# Patient Record
Sex: Female | Born: 1950
Health system: Southern US, Community
[De-identification: ages and names within clinical notes are randomized; demographics above are authoritative.]

## PROBLEM LIST (undated history)

## (undated) DIAGNOSIS — I4719 Other supraventricular tachycardia: Secondary | ICD-10-CM

## (undated) DIAGNOSIS — D4709 Other mast cell neoplasms of uncertain behavior: Secondary | ICD-10-CM

## (undated) DIAGNOSIS — J329 Chronic sinusitis, unspecified: Secondary | ICD-10-CM

## (undated) DIAGNOSIS — J45909 Unspecified asthma, uncomplicated: Secondary | ICD-10-CM

## (undated) DIAGNOSIS — L509 Urticaria, unspecified: Secondary | ICD-10-CM

## (undated) DIAGNOSIS — Z9889 Other specified postprocedural states: Secondary | ICD-10-CM

## (undated) DIAGNOSIS — F419 Anxiety disorder, unspecified: Secondary | ICD-10-CM

## (undated) DIAGNOSIS — T7840XA Allergy, unspecified, initial encounter: Secondary | ICD-10-CM

## (undated) DIAGNOSIS — G43909 Migraine, unspecified, not intractable, without status migrainosus: Secondary | ICD-10-CM

## (undated) DIAGNOSIS — R0600 Dyspnea, unspecified: Secondary | ICD-10-CM

## (undated) DIAGNOSIS — G4730 Sleep apnea, unspecified: Secondary | ICD-10-CM

## (undated) DIAGNOSIS — Z87891 Personal history of nicotine dependence: Secondary | ICD-10-CM

## (undated) DIAGNOSIS — K219 Gastro-esophageal reflux disease without esophagitis: Secondary | ICD-10-CM

## (undated) DIAGNOSIS — I442 Atrioventricular block, complete: Secondary | ICD-10-CM

## (undated) DIAGNOSIS — R112 Nausea with vomiting, unspecified: Secondary | ICD-10-CM

## (undated) DIAGNOSIS — I471 Supraventricular tachycardia: Secondary | ICD-10-CM

## (undated) DIAGNOSIS — E785 Hyperlipidemia, unspecified: Secondary | ICD-10-CM

## (undated) DIAGNOSIS — Z950 Presence of cardiac pacemaker: Secondary | ICD-10-CM

## (undated) DIAGNOSIS — K589 Irritable bowel syndrome without diarrhea: Secondary | ICD-10-CM

## (undated) HISTORY — PX: TONSILLECTOMY AND ADENOIDECTOMY: SUR1326

## (undated) HISTORY — DX: Hyperlipidemia, unspecified: E78.5

## (undated) HISTORY — DX: Gastro-esophageal reflux disease without esophagitis: K21.9

## (undated) HISTORY — PX: COLONOSCOPY: SHX174

## (undated) HISTORY — DX: Allergy, unspecified, initial encounter: T78.40XA

## (undated) HISTORY — PX: APPENDECTOMY: SHX54

## (undated) HISTORY — PX: TUBAL LIGATION: SHX77

## (undated) HISTORY — DX: Urticaria, unspecified: L50.9

## (undated) HISTORY — PX: AUGMENTATION MAMMAPLASTY: SUR837

## (undated) HISTORY — DX: Chronic sinusitis, unspecified: J32.9

## (undated) HISTORY — DX: Irritable bowel syndrome without diarrhea: K58.9

## (undated) HISTORY — PX: INSERT / REPLACE / REMOVE PACEMAKER: SUR710

## (undated) HISTORY — PX: CHOLECYSTECTOMY: SHX55

## (undated) HISTORY — DX: Migraine, unspecified, not intractable, without status migrainosus: G43.909

---

## 1983-06-04 HISTORY — PX: BREAST ENHANCEMENT SURGERY: SHX7

## 2002-08-01 ENCOUNTER — Observation Stay (HOSPITAL_COMMUNITY): Admission: AD | Admit: 2002-08-01 | Discharge: 2002-08-03 | Payer: Self-pay | Admitting: Family Medicine

## 2003-01-06 ENCOUNTER — Emergency Department (HOSPITAL_COMMUNITY): Admission: EM | Admit: 2003-01-06 | Discharge: 2003-01-06 | Payer: Self-pay | Admitting: *Deleted

## 2004-08-03 ENCOUNTER — Ambulatory Visit (HOSPITAL_COMMUNITY): Admission: RE | Admit: 2004-08-03 | Discharge: 2004-08-03 | Payer: Self-pay | Admitting: Family Medicine

## 2004-08-14 ENCOUNTER — Other Ambulatory Visit: Admission: RE | Admit: 2004-08-14 | Discharge: 2004-08-14 | Payer: Self-pay | Admitting: Dermatology

## 2005-01-09 ENCOUNTER — Ambulatory Visit (HOSPITAL_COMMUNITY): Admission: RE | Admit: 2005-01-09 | Discharge: 2005-01-09 | Payer: Self-pay | Admitting: Family Medicine

## 2005-05-08 ENCOUNTER — Ambulatory Visit (HOSPITAL_COMMUNITY): Admission: RE | Admit: 2005-05-08 | Discharge: 2005-05-08 | Payer: Self-pay | Admitting: Family Medicine

## 2006-06-30 ENCOUNTER — Ambulatory Visit (HOSPITAL_COMMUNITY): Admission: RE | Admit: 2006-06-30 | Discharge: 2006-06-30 | Payer: Self-pay | Admitting: Family Medicine

## 2006-11-21 ENCOUNTER — Ambulatory Visit (HOSPITAL_COMMUNITY): Admission: RE | Admit: 2006-11-21 | Discharge: 2006-11-21 | Payer: Self-pay | Admitting: Family Medicine

## 2007-03-05 ENCOUNTER — Ambulatory Visit (HOSPITAL_COMMUNITY): Admission: RE | Admit: 2007-03-05 | Discharge: 2007-03-05 | Payer: Self-pay | Admitting: Family Medicine

## 2007-03-18 ENCOUNTER — Other Ambulatory Visit: Admission: RE | Admit: 2007-03-18 | Discharge: 2007-03-18 | Payer: Self-pay | Admitting: Obstetrics & Gynecology

## 2007-07-08 ENCOUNTER — Ambulatory Visit (HOSPITAL_COMMUNITY): Admission: RE | Admit: 2007-07-08 | Discharge: 2007-07-08 | Payer: Self-pay | Admitting: Family Medicine

## 2007-12-14 ENCOUNTER — Ambulatory Visit (HOSPITAL_COMMUNITY): Admission: RE | Admit: 2007-12-14 | Discharge: 2007-12-14 | Payer: Self-pay | Admitting: Family Medicine

## 2008-01-22 ENCOUNTER — Ambulatory Visit: Payer: Self-pay | Admitting: Gastroenterology

## 2008-01-25 ENCOUNTER — Ambulatory Visit: Payer: Self-pay | Admitting: Gastroenterology

## 2008-02-10 ENCOUNTER — Encounter: Payer: Self-pay | Admitting: Gastroenterology

## 2008-02-10 ENCOUNTER — Ambulatory Visit: Payer: Self-pay | Admitting: Gastroenterology

## 2008-02-10 ENCOUNTER — Ambulatory Visit (HOSPITAL_COMMUNITY): Admission: RE | Admit: 2008-02-10 | Discharge: 2008-02-10 | Payer: Self-pay | Admitting: Gastroenterology

## 2008-03-08 ENCOUNTER — Ambulatory Visit (HOSPITAL_COMMUNITY): Admission: RE | Admit: 2008-03-08 | Discharge: 2008-03-08 | Payer: Self-pay | Admitting: Gastroenterology

## 2008-03-10 ENCOUNTER — Ambulatory Visit: Payer: Self-pay | Admitting: Gastroenterology

## 2008-05-12 ENCOUNTER — Ambulatory Visit (HOSPITAL_COMMUNITY): Admission: RE | Admit: 2008-05-12 | Discharge: 2008-05-12 | Payer: Self-pay | Admitting: Family Medicine

## 2008-05-30 ENCOUNTER — Other Ambulatory Visit: Admission: RE | Admit: 2008-05-30 | Discharge: 2008-05-30 | Payer: Self-pay | Admitting: Obstetrics & Gynecology

## 2008-07-19 ENCOUNTER — Ambulatory Visit (HOSPITAL_COMMUNITY): Admission: RE | Admit: 2008-07-19 | Discharge: 2008-07-19 | Payer: Self-pay | Admitting: Obstetrics & Gynecology

## 2008-07-19 ENCOUNTER — Encounter: Payer: Self-pay | Admitting: Obstetrics & Gynecology

## 2009-11-20 ENCOUNTER — Ambulatory Visit (HOSPITAL_COMMUNITY): Admission: RE | Admit: 2009-11-20 | Discharge: 2009-11-20 | Payer: Self-pay | Admitting: Family Medicine

## 2010-06-24 ENCOUNTER — Encounter: Payer: Self-pay | Admitting: Family Medicine

## 2010-09-18 LAB — CBC
MCV: 98.6 fL (ref 78.0–100.0)
RBC: 4.21 MIL/uL (ref 3.87–5.11)
RDW: 13.2 % (ref 11.5–15.5)

## 2010-09-18 LAB — URINALYSIS, ROUTINE W REFLEX MICROSCOPIC
Bilirubin Urine: NEGATIVE
Glucose, UA: NEGATIVE mg/dL
Protein, ur: NEGATIVE mg/dL
Specific Gravity, Urine: 1.015 (ref 1.005–1.030)
pH: 6 (ref 5.0–8.0)

## 2010-09-18 LAB — BASIC METABOLIC PANEL
BUN: 20 mg/dL (ref 6–23)
Calcium: 9.1 mg/dL (ref 8.4–10.5)
GFR calc non Af Amer: >60 mL/min (ref >60)
Glucose, Bld: 98 mg/dL (ref 70–99)
Sodium: 141 mEq/L (ref 135–145)

## 2010-10-16 NOTE — Letter (Signed)
April 19, 2008    [employer] [place] [employer]:  [employer].   Regarding:  [name], birthday [birth year]  [address] [place], [place] [address].   Dear Madam/Sir:   I am writing a letter to explain the necessity for [name]'s  capsule endoscopy.  [name] is a 60 year old female who has had  endometrial ablation.  She presented to my office in August 2009 with  iron deficiency anemia.  A workup for her source of iron deficiency  anemia was initiated.  She has no other explanation for iron deficiency  anemia except for an obscure GI bleed.  In July 2009, she had a  hemoglobin of 11.7 with an MCV of 91.3 and a creatinine of 0.99.  Her  ferritin was measured at 6 (10-291).  She had a colonoscopy followed by  an esophagogastroduodenoscopy in September 2009.  The colonoscopy  revealed colon polyps and internal hemorrhoids.  None of the polyps  could be identified as a source of her iron deficiency anemia.  An  esophagogastroduodenoscopy revealed a normal esophagus, stomach, and  small intestine.  Biopsies were obtained via cold forceps to evaluate  for celiac sprue as an etiology for her iron deficiency anemia.  Her  duodenal biopsy showed benign small bowel mucosa.  No source for her  iron deficiency/obscure GI bleed was identified on endoscopy and thus a  capsule endoscopy was performed to complete evaluation of her GI tract  and to evaluate the likelihood of a small bowel arteriovenous  malformation or small bowel tumor or small bowel ulcer as a source for  her obscure GI bleed.   Please feel free to contact me at [phone] if you have any  additional questions or if I may be able to clarify the reason for  performing this exam.  POLICY NUMBER:  T[employer] - [phone].   GROUP NUMBER:  [id number].      Kassie Mends, M.D.  Electronically Signed     SM/MEDQ  D:  04/19/2008  T:  04/20/2008  Job:  [id number]   cc:   Lorin Picket A. Gerda Diss, MD

## 2010-10-16 NOTE — Consult Note (Signed)
NAMENEVEYAH, GARZON             ACCOUNT NO.:  [id number]   MEDICAL RECORD NO.:  [id number]          PATIENT TYPE:  AMB   LOCATION:  DAY                           FACILITY:  [facility]   PHYSICIAN:  Kassie Mends, M.D.      DATE OF BIRTH:  [birth year]   DATE OF CONSULTATION:  DATE OF DISCHARGE:                                 CONSULTATION   REQUESTING PHYSICIAN:  Scott A. Gerda Diss, MD   REASON FOR CONSULTATION:  Iron deficiency anemia.   HISTORY OF PRESENT ILLNESS:  Ms. Bodi is a 60 year old Caucasian  female.  She was recently diagnosed with iron deficiency anemia.  She  was started on iron.  She did have surgery last week.  She tells me that  through a CBC ordered by Dr. Sherald Hess.  She had a normal CBC at the  time of surgery.  She denies any previous history of iron deficiency  anemia other than possibly around childbirth when she did require a  transfusion.  She denies any GI complaints.  She had been having soft  brown daily bowel movements.  Denies any rectal bleeding or melena.  She  does have a history of heartburn and indigestion, which has been well  controlled on Nexium.  She has been on Nexium 40 mg b.i.d.  She denies  any dysphagia or odynophagia.  She denies any NSAID use.  She has been  using fiber and MiraLax occasionally for constipation.  This seems to  work very well for her.  She did have a problem with thrombosed  hemorrhoids, which required excision back earlier this year.   PAST MEDICAL HISTORY:  1. Migraine headaches.  2. Chronic GERD.  She tells me she has a remote history of peptic      ulcer disease when she was in her 73s.  3. She has a hiatal hernia.  4. Hypercholesterolemia.  5. She has a history of thrombosed hemorrhoids.  6. History of menopause.   PAST SURGICAL HISTORY:  1. She is status post cholecystectomy.  2. Endometrial hyperplasia.  3. Status post appendectomy.  4. Uterine suspension.  5. Tubal ligation.  6. Tonsillectomy.  7. Breast  augmentation twice, fibrocystic breast.   CURRENT MEDICATIONS:  1. Topamax 100 mg b.i.d.  2. Nexium 40 mg b.i.d.  3. Wellbutrin 300 mg daily.  4. Allegra 100 mg daily.  5. Fish oil 2400 mg daily.  6. Multivitamin daily.  7. Biotin once daily.  8. B-complex once daily.  9. Vitamin E once daily.  10.MiraLax 17 g at bedtime.   ALLERGIES:  BETA-BLOCKERS cause shortness of breath.   FAMILY HISTORY:  There is no known family history of  colorectal  carcinoma, liver or chronic GI problems.  Mother deceased at 11 with  history of dementia.  Father deceased at 68 with history of lung cancer.  She has 5 healthy siblings and one died in a motor vehicle accident.   SOCIAL HISTORY:  Ms. Maske is married.  She has 2 grown healthy  children.  She is Production designer, theatre/television/film at [employer].  She  has a 20  plus-pack-year history of tobacco use.  She consumes a couple  of glasses of wine per year.  Denies any drug use.   REVIEW OF SYSTEMS:  See HPI.  She does have easy bruising.  Denies any  chest pain or palpitations.  Denies any shortness of breath.   PHYSICAL EXAMINATION:  VITAL SIGNS:  Weight 131 pounds, height 64-1/2  inches, temp 98.2, blood pressure 100/68, and pulse 60.  GENERAL:  She is a well-developed and well-nourished Caucasian female in  no acute Distress.HEENT:  Sclerae clear and nonicteric.  Conjunctivae  are pink.  Oropharynx pink and moist without lesions.NECK:  Supple  without mass or thyromegaly.CHEST:  Heart regular rhythm.  Normal S1 and  S2 without murmurs, clicks, rubs, or gallops.  LUNGS:  Clear to auscultation bilaterally.ABDOMEN:  Positive bowel  sounds x4.  No bruits auscultated.  Soft, nontender, nondistended  without palpable mass or hepatosplenomegaly.  No rebound tenderness or  guarding.EXTREMITIES:  Without clubbing or edema.   LABORATORY STUDIES:  From December 26, 2007, she had a ferritin of 6.  She  had a hemoglobin of 11.6, hematocrit of 38.1, and  MCV of 91.3.  She had  a normal MET-7.  She had a normal platelet and white blood cell count.   IMPRESSION:  Ms. Sankey is a 60 year old Caucasian female with  history of a mild iron deficiency anemia.  She denies any  gastrointestinal problems other than chronic constipation and chronic  gastroesophageal reflux disease, well controlled on proton pump  inhibitor.  She does have remote history of peptic ulcer disease.  She  has never had a colonoscopy.  She is going to require further  evaluation.  I am unsure of her Hemoccult status.  She is going to need  colonoscopy to rule out colorectal carcinoma.  If colonoscopy is benign  and she is Hemoccult positive, we would pursue  esophagogastroduodenoscopy at the same time to determine whether she has  peptic ulcer disease.   PLAN:  1. Hemoccult cards x3 to be return as soon as possible.  2. Colonoscopy plus/minus EGD with Dr. Cira Servant in the near future.      Discussed procedure including risks      including but not limited to bleeding, infection, perforation, and      drug reaction.  She agrees with the plan and consent will be      obtained.  3. Continue Nexium 40 mg b.i.d.      Lorenza Burton, N.P.      Kassie Mends, M.D.  Electronically Signed    KJ/MEDQ  D:  01/22/2008  T:  01/23/2008  Job:  [id number]   cc:   Lorin Picket A. Gerda Diss, MD  Fax: [phone]

## 2010-10-16 NOTE — Op Note (Signed)
[name], [name]             ACCOUNT NO.:  [id number]   MEDICAL RECORD NO.:  [id number]          PATIENT TYPE:  AMB   LOCATION:  SDC                           FACILITY:  [facility]   PHYSICIAN:  M. Leda Quail, MD  DATE OF BIRTH:  1950-10-09   DATE OF PROCEDURE:  07/19/2008  DATE OF DISCHARGE:                               OPERATIVE REPORT   PREOPERATIVE DIAGNOSES:  27. A 60 year old G2, P2 married white female with 6 cm left adnexal      mass.  2. Left lower quadrant pain.  3. History of uterine suspension.  4. History of bilateral tubal ligation.  5. Migraines.  6. Gastroesophageal reflux disease.  7. History of appendectomy at age 54.   POSTOPERATIVE DIAGNOSES:  75. A 60 year old G2, P2 married white female with 6 cm left adnexal      mass.  2. Left lower quadrant pain.  3. History of uterine suspension  4. History of bilateral tubal ligation.  5. Migraines.  6. Gastroesophageal reflux disease.  7. History of appendectomy at age 46.   PROCEDURE:  Laparoscopic left salpingo-oophorectomy.   SURGEON:  M. Leda Quail, MD   ASSISTANT:  Edwena Felty. Romine, MD   ANESTHESIA:  General endotracheal, Dr. Jean Rosenthal oversaw the case, Jola Babinski  was her CRNA.   FINDINGS:  Enlarged left ovary filled with clear fluid.   SPECIMENS:  Left tube and ovary sent to Pathology.  Fluid aspirated from  the cyst was not sent as it appeared totally benign and clear.   ESTIMATED BLOOD LOSS:  Minimal.   FLUIDS:  3000 mL of LR.   URINE OUTPUT:  300 mL.   COMPLICATIONS:  None.   INDICATIONS:  [name] is a 60 year old white female who has a  history of left lower quadrant pain.  She underwent ultrasound several  months ago, which showed a 6-cm ovarian mass.  This was followed with a  repeat ultrasound at an appropriate interval of time.  The ovary had not  changed at all or had increased slightly in size.  She began to have  some left lower quadrant pain and because of that was referred  to me.  After discussing findings and procedure, she decided to go ahead and  proceed with scheduling surgery.   OPERATIVE NOTE:  The patient was taken to the operating room.  She was  placed in supine position.  General endotracheal anesthesia was  administered by Anesthesia without difficulty.  Running IV was present  in her right arm.  Informed consent was present on chart.  Legs were  positioned in the low lithotomy position in Wilson stirrups.  Abdomen,  perineum, inner thighs, and vagina prepped in normal sterile fashion  with Betadine prep.  Attention was turned towards the vagina, the legs  were lifted slightly.  A bivalve speculum was placed in the vagina.  The  anterior lip of the cervix was grasped with single-tooth tenaculum.  An  acorn uterine manipulator was advanced to the cervical os and attached  to the tenaculum as a means of manipulating the uterus throughout the  procedure.  The bivalve speculum was removed.  Foley catheter was  inserted in the bladder under sterile condition.  Legs were put back in  the low lithotomy position.  The patient was draped in normal sterile  fashion.   Attention was turned to the abdomen.  Sterile gloves and gowns were  changed.  Above the umbilicus where her laparoscopic cholecystectomy  incision was performed, the previous incision was identified.  Marcaine  0.25% was instilled in the umbilical incision.  A 10-mm skin incision  was made with a #11 blade.  The tissue was dissected with a hemostat  down to the fascia.  The fascia was grasped with a Kocher clamp.  It was  elevated.  Using curved Mayo scissors, fascia was incised sharply.  Peritoneum was identified and incised sharply with Metzenbaum scissors.  A curved S retractor was placed through this incision.  The operator's  index finger was used to feel around, and this tissue felt very smooth  without any adhesions.  A 2-0 Vicryl on an UR needle was obtained.  Purse-string suture  was placed around the fascia at the umbilicus.  Then, a 10-mm Hasson was placed through this incision and attached to  the skin using the suture.  Diagnostic laparoscope was obtained.  This  was placed through the incision to confirm intraperitoneal placement.  Then CO2 gas was placed under low flow to begin the pneumoperitoneum.  This gas went into the abdomen very easily under low flow and it was  then placed on high flow.  The patient was placed in Trendelenburg  positioning at this point.  Uterus was elevated.  The left and right  ovaries were identified.  The ureters were identified bilaterally.  The  right ovary appeared normal.  She has had a previous tubal ligation,  which was noted.  The left ovary was enlarged but appeared benign as  they were no adhesions and it was freely mobile.  The upper abdomen was  surveyed and appeared normal.  She had a small amount of adhesions where  appendectomy was done as well where cholecystectomy was done, these were  very minor.  The left upper quadrant was visualized.  No abnormalities  were noted.  The anterior and posterior culs-de-sac also appeared fine.  The patient had an uterine suspension, which appeared to be done using a  round ligament suspension by attaching the top of the uterus to the  round ligaments.  There was thick scar on both sides connected to the  round ligament and the fundus of the uterus.  At this point, the  abdominal wall was transilluminated and site locations for right and  left lower quadrant ports were identified.  Marcaine 0.25% was used to  anesthetize the skin.  A #11 blade was used to make the skin incision  and then a 5-mm blunt trocar and ports were placed under direct  visualization in the right and left lower quadrants.  The trocars were  removed.  Blunt probe and an endoscopic grasper was obtained.  The left  ovary was freely mobile, could be lifted up very easily.  At this point,  the EnSeal device was  obtained.  The IP ligament was serially clamped,  cauterized, and incised using a sound indicator on the EnSeal device to  ensure proper cauterization before incising the pedicle.  This was done  serially until left IP ligament was freely transected.  Then, the utero-  ovarian pedicle was serially clamped, cauterized, and incised on the  left side until it was fully dissected.  Then, the remaining broad  ligament filmy tissue was clamped, cauterized, and incised until the  ovary was completely free.  The ovary was then drained of fluid using  endoscopic needle through the right port.  About 80 mL of totally clear  fluid was obtained through this.  At this point, the ovary was well  decompressed and small enough to bring out through the midline incision.  The laparoscope was changed to an operative scope, and a toothed grasper  was used to grasp into the ovary.  This was brought out through the  incision.  It could not be completely brought out through the port, so  the port and ovary were completely removed.  Once the ovary and tube  were out, the midline port was replaced back through the fascia without  difficulty.  Laparoscope was used to confirm this placement was correct,  and it was.  The pelvis was resurveyed, no bleeding was noted from the  left side.  The right and left lower quadrant ports were removed under  direct visualization with the laparoscope.  No bleeding was noted.  The  IP ligament was re-visualized one more time, and no bleeding was noted.  At this point, all instruments were removed from the vagina except for  the Hasson in the midline.  The patient was flattened, and she was given  3 deep breaths by the CRNA to help get all the air out of the abdomen.  The abdomen was compressed by the operative assistant.  Once this was  done without difficulty, the midline port was removed.  The operator's  index finger was placed in this incision to ensure no bowel was present,   which it was not.  The midline incision at the fascial layer was closed  by pulling the purse-string suture together and tying it tightly.  The  skin was then closed with a subcuticular stitch of 3-0 Vicryl, and  Dermabond was applied across the 3 incisions to keep them watertight.   Attention was turned to the vagina.  The acorn uterine manipulator and  tenaculum were removed from the cervix.  No bleeding was noted.  The  patient's urine output had been low during the procedure.  We felt that  she was probably dehydrated and so the Foley catheter was left in place.  Betadine prep was cleansed off her skin.  Her legs were positioned back  in supine position.  She did have SCDs present in the lower extremities  bilaterally through the case.  The patient was awakened from anesthesia,  extubated, and taken to the recovery room in stable condition.      Lum Keas, MD  Electronically Signed     MSM/MEDQ  D:  07/19/2008  T:  07/19/2008  Job:  [id number]   cc:   Lorin Picket A. Gerda Diss, MD  Fax: [phone]

## 2010-10-16 NOTE — Op Note (Signed)
NAMEJOSLIN, DOELL             ACCOUNT NO.:  [id number]   MEDICAL RECORD NO.:  [id number]          PATIENT TYPE:  AMB   LOCATION:  DAY                           FACILITY:  [facility]   PHYSICIAN:  Kassie Mends, M.D.      DATE OF BIRTH:  01-27-1951   DATE OF PROCEDURE:  DATE OF DISCHARGE:                                PROCEDURE NOTE   REFERRING PHYSICIAN:  Scott A. Luking, MD   PROCEDURE:  Colonoscopy with cold snare, cold forceps, and snare cautery  polypectomy.   INDICATION FOR EXAM:  Ms. Steffy is a 60 year old female who presents  with iron deficiency anemia.  Her ferritin was 6.  Her last menstrual  cycle was a year ago.  She reports being on a diet and having  intentional weight loss.  She denies any bright red blood per rectum or  black stools like tar.   FINDINGS:  1. Profound melanosis coli as well as a tortuous colon which required      multiple changes in position and pressure to successfully intubate      the cecum.  She had a large amount of liquid stool in the colon      which had particulate matter in it and made it challenging to      aspirate.  Polyps less than 5 mm may have been easily missed.  2. Two 6-mm sessile left colon polyps removed via snare cautery.  Two      4-mm sessile sigmoid colon in descending colon polyps removed via      cold forceps.  One 3-mm sessile rectal polyp removed via cold      forceps.  Otherwise, no masses, inflammatory changes, diverticula,      or AVMs seen.  3. Small internal hemorrhoids.  Otherwise, normal retroflex view of      the rectum.  4. Normal esophagus without evidence of Barrett, mass, erosion,      ulceration, or stricture.  5. Normal stomach, second portion of the duodenal bulb, and second      portion of the duodenum.  Biopsies obtained via cold forceps to      evaluate for celiac sprue as an etiology for iron deficiency      anemia.  Moderate bile staining seen in the second portion of the      duodenum.   DIAGNOSES:  1. Multiple colon polyps.  2. Profound melanosis coli.  3. Normal upper endoscopy.   RECOMMENDATIONS:  1. Restart iron and continue for 3 months.  2. Will call Ms. Matherly with the results of her biopsies.  If her      biopsies do not show an etiology for her iron deficiency anemia,      then she will need capsule endoscopy.  3. Return visit in 2 months with Dr. Cira Servant.  4. No aspirin, NSAIDs, or anticoagulation for 7 days.  She should      follow a high-fiber diet.  She was given a handout on high-fiber      diet, polyps, and hemorrhoids.   MEDICATIONS:  1. Demerol 50 mg IV.  2. Versed 3 mg IV.  3. Phenergan 12.5 mg IV.   PROCEDURE TECHNIQUE:  Physical exam was performed.  Informed consent was  obtained from the patient explaining the benefits, risks, and  alternatives to the procedure.  The patient was connected to the monitor  and placed in the left lateral position.  Continuous oxygen was provided  by nasal cannula, and IV medicine administered through an indwelling  cannula.  After administration of sedation and rectal exam, the  patient's rectum was intubated and the scope was advanced under direct  visualization to the cecum.  The scope was removed slowly by carefully  examining the color, texture, anatomy, and integrity of the mucosa on  the way out.   After the colonoscopy, the patient's esophagus was intubated with the  diagnostic gastroscope, and the scope was advanced under direct  visualization to the second portion of the duodenum.  The scope was  removed slowly by carefully examining the color, texture, anatomy, and  integrity of the mucosa on the way out.  The patient was recovered in  endoscopy and discharged home in satisfactory condition.   PATH:  Simple adenomas. hyperplastic polyps. No celiac sprue.      Kassie Mends, M.D.  Electronically Signed     SM/MEDQ  D:  02/10/2008  T:  02/11/2008  Job:  [id number]   cc:   Lorin Picket A. Gerda Diss, MD   Fax: [phone]

## 2010-10-19 NOTE — Op Note (Signed)
[name], [name]             ACCOUNT NO.:  [id number]   MEDICAL RECORD NO.:  [id number]          PATIENT TYPE:  AMB   LOCATION:  DAY                           FACILITY:  [facility]   PHYSICIAN:  Kassie Mends, M.D.      DATE OF BIRTH:  1950-07-09   DATE OF PROCEDURE:  03/10/2008  DATE OF DISCHARGE:                               OPERATIVE REPORT   PROCEDURE:  [facility] capsule endoscopy.   INDICATION FOR EXAM:  [name] is a 60 year old female who  presented with an obscure GI bleed.  Her ferritin was measured at 6.  She had a colonoscopy and an upper endoscopy which showed no obvious  source for GI bleed.   PROCEDURE DATA:  Height 64 inches, weight 124 pounds, waist 31 inches,  build, thin, gastric passage time 25 minutes, small bowel passage time 4  hours and 24 minutes.   FINDINGS:  Limited views of the gastric mucosa due to the retained  contents.  No fresh blood or old blood seen in the stomach.  Fair  visualization of the small bowel mucosa.  No AVMs, ulcers, or masses  seen.  Limited visualization of the mucosa of the colon.  At 6 hours and  46 minutes, it appears that she has a pinworm.   DIAGNOSES:  No source for iron-deficiency anemia identified, except  multiple colon polyps that were removed.  There were adenomatous and  hyperplastic polyps.  Biopsies of her small intestines showed no  evidence for celiac sprue. Pinworm identified on capsule study. Her do  has been tretaed recently.   RECOMMENDATIONS:  1. [name] should continue her iron.  She will see me in 2      months and we can reevaluate her ferritin.  If her iron-deficiency      anemia persists, then she should consider IV iron replacement.  2. I also called in a prescription for mebendazole 100 mg tablets for      her to take 1 now and 1 in 2 weeks.  I did inform her that all the      people living in the house should be screened for pinworms.  Her      prescription was called into [facility].      Kassie Mends, M.D.  Electronically Signed     SM/MEDQ  D:  03/17/2008  T:  03/18/2008  Job:  [id number]   cc:   Lorin Picket A. Gerda Diss, MD  Fax: [phone]

## 2010-10-19 NOTE — H&P (Signed)
NAMEBANESSA, MAO                         ACCOUNT NO.:  [id number]   MEDICAL RECORD NO.:  [id number]                  PATIENT TYPE:   LOCATION:  210                                  FACILITY:   PHYSICIAN:  Donna Bernard, M.D.             DATE OF BIRTH:   DATE OF ADMISSION:  08/01/2002  DATE OF DISCHARGE:                                HISTORY & PHYSICAL   CHIEF COMPLAINT:  Chest pain.   HISTORY OF PRESENT ILLNESS:  This patient is a 60 year old Caucasian female  with a fairly benign prior medical history.  She presented to the ER the  morning of admission with complaints of chest pain.  The patient states she  awakened the other night with substernal deep pressure in her chest.  This  was accompanied by shortness of breath and sweating.  The patient had no  nausea.  She called EMS.  Soon after they arrived, her discomfort improved,  but she went on to [facility] Emergency Room.  There was evaluated and  cardiac enzymes were negative.  Initial EKG unremarkable but there were  significant risk factors and they recommended admission.  She requested  transfer here.  Currently, the patient is experiencing no significant  discomfort.  She does note some shortness of breath off and on in the last  several weeks intermittently.  She does smoke unfortunately about a pack per  day and has done so since age 67.  She does have reflux symptoms on  occasion, several times a week.  She states that this discomfort was  markedly different than that.  She has been under increased stress recently  with family issues and business issues.  The patient notes compliance with  her current medications which include:   CURRENT MEDICATIONS:  Wellbutrin XL 300 mg daily, Allegra p.r.n., Flonase  two sprays each nostril p.r.n.   PAST SURGICAL HISTORY:  Appendectomy, cholecystectomy.   SOCIAL HISTORY:  Patient is married and smokes a pack per day.  No alcohol.  Does have children.   FAMILY  HISTORY:  Positive for coronary artery disease in an uncle in his  85's and her mother had heart disease that she felt was related to her  diabetes.  Also, positive for hypertension.   REVIEW OF SYSTEMS:  Otherwise, negative.   PHYSICAL EXAMINATION:  VITAL SIGNS:  Blood pressure 118/70, pulse 85,  respiratory rate normal.  GENERAL:  Alert, currently in no apparent distress.  HEENT:  Normal.  Disks sharp.  Fundi normal.  NECK:  Supple, no JVD.  LUNGS:  Clear.  CHEST:  No chest wall tenderness.  HEART:  Normal heart rate and rhythm without significant murmurs.  ABDOMEN:  Some mild epigastric tenderness to deep pressure.  No rebound, no  guarding, no organomegaly.  NEUROLOGIC:  Intact.  SKIN:  Normal.   LABORATORY DATA:  CPK and troponin, MB all within normal limits.  EKG:  There are nonspecific ST-T  changes noted.   IMPRESSION:  Chest pain with somewhat bothersome features, with multiple  risk factors.  With normal enzymes and unremarkable EKG, I feel that 24 hour  monitoring is the most appropriate decision at this point.   PLAN:  Serial cardiac enzymes, telemetry and maintain other medications  further or as noted on the chart.   NOTE:  The patient was given nitroglycerin paste in the ER, but now is  noting headache and will hold off on this for now.  Further orders as noted  on the chart.                                                Donna Bernard, M.D.    Karie Chimera  D:  08/01/2002  T:  08/01/2002  Job:  [id number]

## 2010-10-19 NOTE — Procedures (Signed)
   NAME:  [name], [name]                       ACCOUNT NO.:  [id number]   MEDICAL RECORD NO.:  [id number]                   PATIENT TYPE:  INP   LOCATION:  A210                                 FACILITY:  [facility]   PHYSICIAN:  Donna Bernard, M.D.             DATE OF BIRTH:  1951/03/14   DATE OF PROCEDURE:  DATE OF DISCHARGE:  08/03/2002                                EKG INTERPRETATION   DESCRIPTION OF PROCEDURE:  Electrocardiogram reveals normal sinus rhythm  with borderline low voltage in the limb leads.  There are no significant ST  segment changes.                                               Donna Bernard, M.D.    Karie Chimera  D:  08/10/2002  T:  08/10/2002  Job:  [id number]

## 2010-10-19 NOTE — Consult Note (Signed)
NAME:  [name], [name]                       ACCOUNT NO.:  [id number]   MEDICAL RECORD NO.:  [id number]                   PATIENT TYPE:  INP   LOCATION:  A210                                 FACILITY:  [facility]   PHYSICIAN:  Vida Roller, M.D.                DATE OF BIRTH:  25-Dec-1950   DATE OF CONSULTATION:  DATE OF DISCHARGE:                                   CONSULTATION   HISTORY OF PRESENT ILLNESS:  [name] is a 60 year old white female  with no significant past medical history other than gastroesophageal reflux  disease who presented to [facility] emergency room complaining of  substernal chest discomfort. She states that she was asleep and had the  sudden onset of discomfort in her chest, associated with significant  diaphoresis. No radiation. Minimal shortness of breath and no palpitations.  The pain lasted approximately one hour. At that point, she called the EMS  System which arrived at her house and placed her on nasal cannula O2 with  complete resolution of the discomfort. She presented to the emergency room  at [facility], where she was evaluated, the records of which are sketchy but  it appears that she requested transportation here to [facility] for  definitive evaluation. There is some documentation that cardiac enzymes were  drawn and were negative. There is no documentation of a chest x-ray. Upon  interview this morning, she is pain free and feeling well. She has had no  recurrence of the discomfort. She was requesting definitive evaluation.   PAST MEDICAL HISTORY:  Significant for cholecystitis, status post  cholecystectomy. Gastroesophageal reflux disease.   CARDIAC RISK FACTORS:  Significant only for tobacco abuse.  She has no  significant family history. No hypertension, diabetes mellitus, or  hypercholesterolemia.   PAST SURGICAL HISTORY:  She had cholecystectomy in the past and an  appendectomy in the past without difficulties.   MEDICATIONS:  Include Wellbutrin and allergy medications.   FAMILY HISTORY:  She has a father who died in his 70's of lung cancer. Her  mother is in her 32's who has some coronary disease but this occurred late  in life and was associated with diabetes mellitus. She has five siblings,  all of whom are healthy without significant medical problems. She has two  children in their 30's, both of whom have no medical problems and four  grandchildren who are healthy.   SOCIAL HISTORY:  She recently quit smoking after 40+ pack year smoking  history. She does not drink alcohol. Does not do drugs. She works for a  [employer] but is not exposed to significant toxins, as she works in  Programmer, multimedia side. She is married and her husband is healthy.   REVIEW OF SYSTEMS:  Noncontributory other than that mentioned in the history  and physical examination.   PHYSICAL EXAMINATION:  GENERAL: A well developed, well nourished  white  female in no  acute distress. Alert and oriented times four.  VITAL SIGNS: Blood pressure 108/50, heart rate 77 and sinus. Respiratory  rate 20. She is afebrile.  HEENT: Unremarkable examination.  NECK: Supple with no jugular venous distention. No carotid bruits.  CHEST: Chest clear to auscultation.  HEART: Nondisplaced point of maximal impulse with no lifts or thrills. First  and second heart sounds are normal. There is no 3rd or 4th heart sound or  murmur.  ABDOMEN: Soft, nontender with normal active bowel sounds. No  hepatosplenomegaly.  EXTREMITIES: Lower extremities without clubbing, cyanosis, or edema. She has  2+ pulses throughout with no bruits.  NEURO: Normal examination.  MUSCULOSKELETAL: Normal examination.   LABORATORY DATA:  She has cardiac enzymes times two which are negative. She  has a cholesterol panel which shows total cholesterol of 173 with  triglycerides of 94, HDL of 62, LDL of 92.   ASSESSMENT:  1. Chest pain which is very atypical for  coronary disease but she has risk     factors which are significant. Will do an echocardiogram this morning to     assess her wall motion abnormalities and then if her acoustic windows are     good, will proceed to a stress echocardiogram after she has had three     sets of negative enzymes.  2. Tobacco abuse. She has recently quit but it is still a significant risk     factor. She is currently on Wellbutrin. She also may have a mild element     of chronic obstructive pulmonary disease. There is no documented chest x-     ray anywhere and it may be worthwhile to get one during this admission.  3. Gastroesophageal reflux disease. She has an excellent history for this.     She has recently stopped using her Nexium for this, due to the inability     to afford to pay for it, now that her insurance will not cover it.     Unfortunately, her gastroesophageal reflux disease has come back and her     description of her pain is much more consistent with esophageal spasm. So     consideration for re-starting the Nexium.                                               Vida Roller, M.D.    JH/MEDQ  D:  08/02/2002  T:  08/02/2002  Job:  [id number]   cc:   Lorin Picket A. Gerda Diss, M.D.  [address], [address]  [place]  [place] [address]  Fax: [phone]

## 2010-10-19 NOTE — Procedures (Signed)
   NAMELAVERNA, DOSSETT                         ACCOUNT NO.:  [id number]   MEDICAL RECORD NO.:  [id number]                  PATIENT TYPE:   LOCATION:                                       FACILITY:   PHYSICIAN:  Donna Bernard, M.D.             DATE OF BIRTH:   DATE OF PROCEDURE:  08/01/2002  DATE OF DISCHARGE:                                EKG INTERPRETATION   DESCRIPTION OF PROCEDURE:  Electrocardiogram reveals normal sinus rhythm  with borderline low voltage in the limb leads.  There are no significant ST-  T changes.                                               Donna Bernard, M.D.    Karie Chimera  D:  08/10/2002  T:  08/10/2002  Job:  [id number]

## 2010-10-19 NOTE — Group Therapy Note (Signed)
   NAME:  JERICHA, BRYDEN                       ACCOUNT NO.:  [id number]   MEDICAL RECORD NO.:  [id number]                   PATIENT TYPE:  INP   LOCATION:  A210                                 FACILITY:  [facility]   PHYSICIAN:  Vida Roller, M.D.                DATE OF BIRTH:  1950-12-06   DATE OF PROCEDURE:  DATE OF DISCHARGE:                                   PROGRESS NOTE   INDICATIONS:  The patient is a 60 year old female with no significant past  medical history other than GERD.  She presents with chest pain that is  atypical for coronary disease.  She had cardiac enzymes that were negative  for acute myocardial infarction.  Cardiac risk factors include tobacco abuse  only.   BASELINE DATA:  EKG:  Sinus rhythm at 74 beats per minute with nonspecific  ST abnormalities.  Blood pressure 122/70.   The patient exercised for a total of 6 minutes and 20 seconds in the Bruce  protocol stage 3.  The patient exercised to 7.0 mets.  Maximum heart rate  achieved was 159 beats per minute, which is 94% maximum predicted.  Maximum  blood pressure is 198/90.  The patient described mild shortness of breath  with exercise and denies any chest discomfort.  EKG showed no ischemic  changes and no arrythmia.  Final images and results are pending and being  reviewed.      Amy Mercy Riding, P.A. [facility]                     Vida Roller, M.D.    AB/MEDQ  D:  08/03/2002  T:  08/03/2002  Job:  [id number]

## 2010-10-19 NOTE — Procedures (Signed)
   NAME:  [name], [name]                       ACCOUNT NO.:  [id number]   MEDICAL RECORD NO.:  [id number]                   PATIENT TYPE:  INP   LOCATION:  A210                                 FACILITY:  [facility]   PHYSICIAN:  Vida Roller, M.D.                DATE OF BIRTH:  03-Jun-1951   DATE OF PROCEDURE:  08/02/2002  DATE OF DISCHARGE:                                  ECHOCARDIOGRAM   REFERRING PHYSICIAN:  Donna Bernard, M.D.   PROCEDURE:  Echocardiogram.  Tape #[id number], tape count 4302 to 4698.   REASON FOR PROCEDURE:  This is a 60 year old woman with chest pain.  Quality  of the study is difficult but adequate.   M-MODE MEASUREMENTS:  1. The aorta is 26 mm.  2. The left atrium is 39 mm.  3. The septum is 11 mm.  4. The posterior wall is 8 mm.  5. The left ventricular diastolic dimension is 41 mm.  6. The left ventricular systolic dimension is 31 mm.   2-D AND DOPPLER IMAGING:  1. The left ventricle is normal size with normal systolic function.  It is     actually on the border of hyperdynamic with cavity obliteration.  There     are no significant obvious wall motion abnormalities but with a     hyperdynamic ventricle it is a challenge to assess.  No diastolic     function was assessed secondary to the hyperdynamic nature of the     ventricle.  2. The right ventricle is normal size with normal systolic function.  3. Both atria are normal size.  There is no evidence of atrioseptal defect.  4. The aortic valve is trileaflet/tricommissural with no evidence of     stenosis or regurgitation.  5. The mitral valve is morphologically unremarkable with no stenosis or     regurgitation.  6. The tricuspid valve is morphologically unremarkable with no stenosis or     regurgitation.  7. The pulmonic valve was not well seen.  8. The ascending aorta was not well seen.  9. The pericardial structures appear normal.                                               Vida Roller,  M.D.    JH/MEDQ  D:  08/02/2002  T:  08/02/2002  Job:  [id number]

## 2010-10-19 NOTE — Discharge Summary (Signed)
   NAME:  [name], [name]                       ACCOUNT NO.:  [id number]   MEDICAL RECORD NO.:  [id number]                   PATIENT TYPE:  INP   LOCATION:  A210                                 FACILITY:  [facility]   PHYSICIAN:  Donna Bernard, M.D.             DATE OF BIRTH:  1951-05-02   DATE OF ADMISSION:  08/01/2002  DATE OF DISCHARGE:  08/03/2002                                 DISCHARGE SUMMARY   FINAL DIAGNOSES:  1. Chest pain, miocardial infarction and miocardial ischemia ruled out.  2. Esophageal reflux.  3. History of tobacco abuse.   FINAL DISPOSITION:  1. The patient discharged home.  2. Discharge medications:     a. Nexium 40 mg one each morning.     b. Wellbutrin XL 300 mg daily.     c. _________ p.r.n.     d. Flonase two sprays each nostril p.r.n.  3. The patient to follow up in the office in one to two weeks.   INITIAL HISTORY AND PHYSICAL:  Please see H&P as dictated.   HOSPITAL COURSE:  This patient is a 60 year old white female with fairly  benign prior medical history other than chronic tobacco abuse who presented  to the emergency room the day of admission with complaints of chest pain.  She gave a fairly good history of deep substernal pressure that was  associated with by shortness of breath and a sweaty sensation.  The patient  originally presented to [facility] but was transferred here for definitive  management.  We did get serial cardiac enzymes which were normal and serial  EKGs remained normal, and cardiologists were consulted who recommended a  stress test.  She had an echocardiogram which was essentially normal.  She  also had a stress Cardiolite test with normal imaging.  The patient recently  had a fair amount of reflux-type symptoms.  In addition, she had stopped her  Nexium due to financial considerations.  It was felt that more than likely  her pain was from esophageal spasms so on the day of discharge the patient  was discharged home with  diagnoses and disposition as noted above.                                               Donna Bernard, M.D.    Karie Chimera  D:  08/10/2002  T:  08/10/2002  Job:  [id number]

## 2012-09-25 ENCOUNTER — Other Ambulatory Visit (HOSPITAL_COMMUNITY): Payer: Self-pay | Admitting: Family Medicine

## 2012-11-02 ENCOUNTER — Ambulatory Visit (INDEPENDENT_AMBULATORY_CARE_PROVIDER_SITE_OTHER): Payer: BC Managed Care – PPO | Admitting: Nurse Practitioner

## 2012-11-02 ENCOUNTER — Encounter: Payer: Self-pay | Admitting: Nurse Practitioner

## 2012-11-02 VITALS — BP 100/70 | Temp 98.3°F | Ht 63.5 in | Wt 137.1 lb

## 2012-11-02 DIAGNOSIS — W57XXXA Bitten or stung by nonvenomous insect and other nonvenomous arthropods, initial encounter: Secondary | ICD-10-CM

## 2012-11-02 MED ORDER — CLOBETASOL PROPIONATE 0.05 % EX CREA: TOPICAL_CREAM | Freq: Two times a day (BID) | CUTANEOUS | Status: DC

## 2012-11-02 NOTE — Patient Instructions (Addendum)
Loratadine 10 mg in the morning Benadryl 25 mg at night 

## 2012-11-02 NOTE — Progress Notes (Signed)
Subjective:  Presents complaints of inflammation and itching around the tick bite on the upper outer right arm that occurred a few days ago. Has several other tick bites with only slight itching. No fever. No headache. No other rash. Mild tenderness in the upper arm from elbow to shoulder..  Objective:   BP 100/70  Temp(Src) 98.3 F (36.8 C) (Oral)  Ht 5' 3.5" (1.613 m)  Wt 137 lb 2 oz (62.199 kg)  BMI 23.91 kg/m2 NAD. Alert, oriented. Lungs clear. Heart regular rate rhythm. Several tick bites noted with mild inflammation.  upper outer right arm there is a pink slightly raised approximately 4 cm well-defined circular area with a tiny puncture in the Center nontender to palpation, slightly warm. No drainage. No evidence of infection.  Assessment:Tick bites  Plan: Clobetasol cream as directed. Loratadine in the morning and Benadryl at nighttime. Hold on Zyrtec for now. Patient would like to hold on oral steroids if possible. Call back in 72 hours if no improvement, sooner if worse. Given written and verbal information on tick bites and tick fever.

## 2012-11-10 ENCOUNTER — Other Ambulatory Visit (HOSPITAL_COMMUNITY): Payer: Self-pay | Admitting: Family Medicine

## 2012-11-12 NOTE — Telephone Encounter (Signed)
May refill x2, office visits at least every 6 months

## 2012-11-13 ENCOUNTER — Encounter: Payer: Self-pay | Admitting: *Deleted

## 2012-11-23 ENCOUNTER — Other Ambulatory Visit (HOSPITAL_COMMUNITY): Payer: Self-pay | Admitting: Family Medicine

## 2012-12-29 ENCOUNTER — Ambulatory Visit: Payer: BC Managed Care – PPO | Admitting: Family Medicine

## 2013-01-05 ENCOUNTER — Ambulatory Visit (INDEPENDENT_AMBULATORY_CARE_PROVIDER_SITE_OTHER): Payer: BC Managed Care – PPO | Admitting: Family Medicine

## 2013-01-05 ENCOUNTER — Encounter: Payer: Self-pay | Admitting: Family Medicine

## 2013-01-05 VITALS — BP 102/64 | Temp 98.8°F | Ht 64.5 in | Wt 138.4 lb

## 2013-01-05 DIAGNOSIS — G43909 Migraine, unspecified, not intractable, without status migrainosus: Secondary | ICD-10-CM

## 2013-01-05 DIAGNOSIS — G43901 Migraine, unspecified, not intractable, with status migrainosus: Secondary | ICD-10-CM

## 2013-01-05 MED ORDER — MOMETASONE FUROATE 0.1 % EX CREA: TOPICAL_CREAM | CUTANEOUS | Status: DC

## 2013-01-05 MED ORDER — MOMETASONE FUROATE 50 MCG/ACT NA SUSP: 2.0000 | Freq: Every day | NASAL | Status: DC

## 2013-01-05 NOTE — Progress Notes (Signed)
  Subjective:    Patient ID: [name], female    DOB: 11-14-1950, 62 y.o.   MRN: [id number]  HPIHere for a tick bite on right arm. Happened on May 30th still tender. Patient relates that this causing some itching occasionally drains clear liquid has not drained pus. No other rashes with it.  Migraine headaches have increased over the past two months. She gets a headache some [place] daily. At times he wakes her up in the middle night in addition to this also causes her to have nausea with it and some vomiting. She has tried her Topamax on a daily basis not helping. Wonders if there's anything L. she can do. She has tried to cut back on her caffeine  Ears and nose stopped up using saline solution. Patient does relate some sinus pressure no mucoid drainage she denies high fever chills or sweats moderate allergies.  Fatigue  going on for awhile worse last 2 months to some degree fatigue over the past couple months but she's been under a lot of stress not resting well  Dry mouth x 3-4 months  Nightly wakes up bcz of headaches Vomiting with headache Minimizes caffiene  PMH migraines Review of Systems See above no double vision no unilateral numbness or weakness no chest congestion    Objective:   Physical Exam Optic disc sharp pupils responsive to light eardrums normal neck no masses lungs are clear no crackles heart regular no edema       Assessment & Plan:  Allergies causing sinus symptoms treatment discussed over-the-counter as well as Nasonex will use this over the next few weeks  Frequent migraines probably related to stress stress reduction helpful in addition to this minimize caffeine. Continue current medications possibly adding other medicines I do believe this patient should get this MRI of the head to rule out the possibility of a tumor because these headaches are now waking her up in the middle night and causing severe pain with vomiting she had a CT scan back in 2006. An  MRI would show much greater detail and because of the red flags is indicated. She does have some claustrophobia so we would have to see if we could get her in a open MRI

## 2013-01-06 NOTE — Progress Notes (Signed)
RX called in on vociemail

## 2013-01-21 DIAGNOSIS — G43909 Migraine, unspecified, not intractable, without status migrainosus: Secondary | ICD-10-CM | POA: Insufficient documentation

## 2013-05-03 ENCOUNTER — Telehealth: Payer: Self-pay | Admitting: Family Medicine

## 2013-05-03 NOTE — Telephone Encounter (Signed)
FYI - Pt returned my call from 02/15/13, she's decided not to have MRI at this time.  The medicine is controlling her migraines and she will let us know if she decides to get MRI.  I did inform patient in message I left on her voicemail 02/15/13 that [facility] does allow patients to view the open machine through a window (of course not with a patient in the machine) so if she decides to get test, she should get it done there due to her clausterphobia

## 2013-05-26 ENCOUNTER — Other Ambulatory Visit: Payer: Self-pay | Admitting: Family Medicine

## 2013-05-26 NOTE — Telephone Encounter (Signed)
May refill times 4 

## 2013-07-03 ENCOUNTER — Emergency Department (HOSPITAL_COMMUNITY): Payer: BC Managed Care – PPO

## 2013-07-03 ENCOUNTER — Encounter (HOSPITAL_COMMUNITY): Payer: Self-pay | Admitting: Emergency Medicine

## 2013-07-03 ENCOUNTER — Emergency Department (HOSPITAL_COMMUNITY)
Admission: EM | Admit: 2013-07-03 | Discharge: 2013-07-04 | Disposition: A | Discharge status: Home / Self Care | Payer: BC Managed Care – PPO | Attending: Emergency Medicine | Admitting: Emergency Medicine

## 2013-07-03 DIAGNOSIS — E785 Hyperlipidemia, unspecified: Secondary | ICD-10-CM | POA: Insufficient documentation

## 2013-07-03 DIAGNOSIS — Z79899 Other long term (current) drug therapy: Secondary | ICD-10-CM | POA: Insufficient documentation

## 2013-07-03 DIAGNOSIS — Z8719 Personal history of other diseases of the digestive system: Secondary | ICD-10-CM | POA: Insufficient documentation

## 2013-07-03 DIAGNOSIS — Y9301 Activity, walking, marching and hiking: Secondary | ICD-10-CM | POA: Insufficient documentation

## 2013-07-03 DIAGNOSIS — S82202A Unspecified fracture of shaft of left tibia, initial encounter for closed fracture: Secondary | ICD-10-CM

## 2013-07-03 DIAGNOSIS — S82209A Unspecified fracture of shaft of unspecified tibia, initial encounter for closed fracture: Secondary | ICD-10-CM | POA: Insufficient documentation

## 2013-07-03 DIAGNOSIS — Z87891 Personal history of nicotine dependence: Secondary | ICD-10-CM | POA: Insufficient documentation

## 2013-07-03 DIAGNOSIS — Y9229 Other specified public building as the place of occurrence of the external cause: Secondary | ICD-10-CM | POA: Insufficient documentation

## 2013-07-03 DIAGNOSIS — G43909 Migraine, unspecified, not intractable, without status migrainosus: Secondary | ICD-10-CM | POA: Insufficient documentation

## 2013-07-03 DIAGNOSIS — IMO0002 Reserved for concepts with insufficient information to code with codable children: Secondary | ICD-10-CM | POA: Insufficient documentation

## 2013-07-03 MED ORDER — OXYCODONE-ACETAMINOPHEN 5-325 MG PO TABS
1.0000 | ORAL_TABLET | Freq: Once | ORAL | Status: AC
  Administered 2013-07-04: 1 via ORAL
  Filled 2013-07-03: qty 1

## 2013-07-03 MED ORDER — ONDANSETRON HCL 4 MG/2ML IJ SOLN
4.0000 mg | Freq: Once | INTRAMUSCULAR | Status: AC
  Administered 2013-07-03: 4 mg via INTRAVENOUS
  Filled 2013-07-03: qty 2

## 2013-07-03 MED ORDER — OXYCODONE-ACETAMINOPHEN 5-325 MG PO TABS
2.0000 | ORAL_TABLET | Freq: Once | ORAL | Status: AC
  Administered 2013-07-03: 2 via ORAL
  Filled 2013-07-03: qty 2

## 2013-07-03 MED ORDER — IBUPROFEN 400 MG PO TABS
600.0000 mg | ORAL_TABLET | Freq: Once | ORAL | Status: AC
  Administered 2013-07-03: 600 mg via ORAL
  Filled 2013-07-03 (×2): qty 1

## 2013-07-03 MED ORDER — MORPHINE SULFATE 4 MG/ML IJ SOLN
6.0000 mg | Freq: Once | INTRAMUSCULAR | Status: DC
  Filled 2013-07-03: qty 2

## 2013-07-03 MED ORDER — OXYCODONE-ACETAMINOPHEN 5-325 MG PO TABS: 1.0000 | ORAL_TABLET | ORAL | Status: DC | PRN

## 2013-07-03 NOTE — ED Notes (Signed)
Upon return to patient room patient c/o nausea, became diaphoretic, BP dropped, pt placed in [place], knee immobilizer removed due to pain, pt symptoms resolved quickly. Pt requesting to not have IV start and states "this has happened to me before and I already feel better", BP returned to more normal range for patient, MD [facility] aware and in agreement with no IV start at this time, pt given PO fluids, skin returned to warm and dry. Will continue to monitor.
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
## 2013-07-03 NOTE — ED Notes (Signed)
Patient returned from CT
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
## 2013-07-03 NOTE — Discharge Instructions (Signed)
Tibial Fracture, Adult You have a fracture (break in bone) of your tibia. This is the large "shin" bone in your lower leg. These fractures are easily diagnosed with x-rays. TREATMENT  You have a simple fracture which usually will heal without disability. It can be treated with simple immobilization. This means the bone can be held with a cast or splint in a favorable position until your caregiver feels it is stable (healed well enough). Then you can begin range of motion exercises to keep your knee and ankle limber (moving well). HOME CARE INSTRUCTIONS   Apply ice to the injury for 15-20 minutes, 03-04 times per day while awake, for 2 days. Put the ice in a plastic bag and place a thin towel between the bag of ice and your cast.  If you have a plaster or fiberglass cast:  Do not try to scratch the skin under the cast using sharp or pointed objects.  Check the skin around the cast every day. You may put lotion on any red or sore areas.  Keep your cast dry and clean.  If you have a plaster splint:  Wear the splint as directed.  You may loosen the elastic around the splint if your toes become numb, tingle, or turn cold or blue.  Do not put pressure on any part of your cast or splint until it is fully hardened.  Your cast or splint can be protected during bathing with a plastic bag. Do not lower the cast or splint into water.  Use crutches as directed.  Only take over-the-counter or prescription medicines for pain, discomfort, or fever as directed by your caregiver.  See your caregiver as directed. It is very important to keep all follow-up referrals and appointments in order to avoid any long-term problems with your leg and ankle including chronic pain, inability to move the ankle normally, failure of the fracture to heal and permanent disability. SEEK IMMEDIATE MEDICAL CARE IF:   Pain is becoming worse rather than better, or if pain is uncontrolled with medications.  You have  increased swelling, pain, or redness in the foot.  You begin to lose feeling in your foot or toes.  You develop a cold or blue foot or toes on the injured side.  You develop severe pain in your injured leg, especially if it is increased with movement of your toes. Document Released: 02/12/2001 Document Revised: 08/12/2011 Document Reviewed: 09/06/2008 [facility] Patient Information 2014 [place], [place].

## 2013-07-04 NOTE — ED Notes (Signed)
Ortho tech instructed patient how to properly use crutches. Pt wheeled out of ED in wheelchair. Verbalizing no complaints at this time.

## 2013-07-23 ENCOUNTER — Telehealth: Payer: Self-pay | Admitting: Family Medicine

## 2013-07-23 MED ORDER — ONDANSETRON 8 MG PO TBDP: 8.0000 mg | ORAL_TABLET | Freq: Three times a day (TID) | ORAL | Status: DC | PRN
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
## 2013-07-23 NOTE — Telephone Encounter (Signed)
No , nothing yet, zofran 8 mg , 1 tid prn , # 15, 1 refill

## 2013-07-23 NOTE — Telephone Encounter (Signed)
Rx sent electronically to pharmacy. Patient notified. 

## 2013-08-30 ENCOUNTER — Ambulatory Visit (HOSPITAL_COMMUNITY)
Admission: RE | Admit: 2013-08-30 | Discharge: 2013-08-30 | Disposition: A | Discharge status: Home / Self Care | Payer: BC Managed Care – PPO | Source: Ambulatory Visit | Attending: Orthopedic Surgery | Admitting: Orthopedic Surgery

## 2013-08-30 DIAGNOSIS — M25569 Pain in unspecified knee: Secondary | ICD-10-CM | POA: Insufficient documentation

## 2013-08-30 DIAGNOSIS — M79609 Pain in unspecified limb: Secondary | ICD-10-CM | POA: Insufficient documentation

## 2013-08-30 DIAGNOSIS — IMO0001 Reserved for inherently not codable concepts without codable children: Secondary | ICD-10-CM | POA: Insufficient documentation

## 2013-08-30 DIAGNOSIS — R262 Difficulty in walking, not elsewhere classified: Secondary | ICD-10-CM | POA: Insufficient documentation

## 2013-08-30 DIAGNOSIS — M6281 Muscle weakness (generalized): Secondary | ICD-10-CM | POA: Insufficient documentation

## 2013-08-30 NOTE — Evaluation (Signed)
Physical Therapy Evaluation  Patient Details  Name: [name] MRN: [id number] Date of Birth: 24-Jan-1951  Today's Date: 08/30/2013 Time: 1515-1605 PT Time Calculation (min): 50 min Charges 1 evaluation, Therapeutic Exercise 1535-1605              Visit#: 1 of 24  Re-eval: 09/29/13 Assessment Diagnosis: Difficulty walking secondary to limited knee mobilit and weakness following Lt Tibial plateau fracture Surgical Date: 07/03/13 Prior Therapy: no  Authorization: BCBS    Authorization Time Period:    Authorization Visit#:   of     Past Medical History:  Past Medical History  Diagnosis Date  . Allergy   . Hyperlipidemia   . Migraines     Chronic  . Sinusitis   . Acid reflux    Past Surgical History:  Past Surgical History  Procedure Laterality Date  . Tubal ligation    . Appendectomy    . Cholecystectomy    . Tonsillectomy and adenoidectomy    . Colonoscopy    . Breast enhancement surgery  1985    Subjective Symptoms/Limitations Symptoms: Pain with movement. Patient notes knee stiffens up at night  Pertinent History: Patient fell on left side breaking tibia and knee on Lt side. Patient also hasl N/T and pain in Lt UE, and had x-ray that indicated no UE or neck fracture. Patient is to be 50% weight bearing until May itgh brace set at 10 to 100 degrees of motion  Limitations: Standing;Walking How long can you stand comfortably?: 1 hour How long can you walk comfortably?: 1 hour Repetition: Decreases Symptoms Patient Stated Goals: to get back to running, walking, squatting onto the floor.  Pain Assessment Currently in Pain?: Yes Pain Score: 3  Pain Location: Knee Pain Orientation: Left Pain Type: Acute pain;Chronic pain Pain Onset: More than a month ago Pain Frequency: Intermittent Effect of Pain on Daily Activities: unable to WB >50%, pain with turning and twisting knee.   Precautions/Restrictions  Restrictions Weight Bearing Restrictions: Yes LLE  Weight Bearing: Partial weight bearing LLE Partial Weight Bearing Percentage or Pounds: 50%  Balance Screening Balance Screen Has the patient fallen in the past 6 months: Yes How many times?: 1 Has the patient had a decrease in activity level because of a fear of falling? : No Is the patient reluctant to leave their home because of a fear of falling? 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  Cognition/Observation Cognition Overall Cognitive Status: Within Functional Limits for tasks assessed Observation/Other Assessments Observations: Swelling and muscle wasting noticed in Lt LE  Sensation/Coordination/Flexibility/Functional Tests Sensation Light Touch: Appears Intact Coordination Gross Motor Movements are Fluid and Coordinated: Yes  Assessment LLE AROM (degrees) Left Hip Flexion: 90 Left Hip External Rotation : 48 Left Hip Internal Rotation : 33 Left Knee Extension: -8 Left Knee Flexion: 105 Left Ankle Dorsiflexion: 5 Left Ankle Plantar Flexion: 50 LLE Strength Left Hip Flexion: 2+/5 Left Knee Flexion: 2+/5 Left Knee Extension: 2+/5 Left Ankle Dorsiflexion: 2+/5  Exercise/Treatments Stretches Active Hamstring Stretch: 3 reps;30 seconds (3way) Quad Stretch: 3 reps;30 seconds (3 way) Gastroc Stretch: 3 reps;30 seconds (3 way) Supine Short Arc Quad Sets: 10 reps;AROM Heel Slides: 10 reps;Left;AROM Other Supine Knee Exercises: Bridges XXT 10x each     Physical Therapy Assessment and Plan PT Assessment and Plan Clinical Impression Statement: Patient displays knee pain, decreased ROM and weakness following a Tibial  Plateau fracture sustained 07/03/13 for which patient was put on NWB precaustions and immobilized. Patient displays a good response to initial exercises with increased ROM and good, but weak muscle activiation. Patient is currently on  50% Weight  bearign precautions. At patient's request, she is appropriate for PT 1x a week for 4 weeks, then after removal of [place] precaustions patient will be appropriate for 2-3x a week for strengthenign and return to sport training.  Pt will benefit from skilled therapeutic intervention in order to improve on the following deficits: Abnormal gait;Decreased activity tolerance;Decreased balance;Decreased strength;Difficulty walking;Impaired flexibility;Decreased range of motion;Decreased mobility;Pain Rehab Potential: Good PT Frequency: Min 2X/week PT Duration: 12 weeks PT Treatment/Interventions: Gait training;Stair training;Functional mobility training;Therapeutic activities;Therapeutic exercise;Balance training;Patient/family education;Manual techniques PT Plan: Progress knee mobility and strength as tolerated per 50% weight bearign preacaution. Nex sesion add piriformis S, Calf raises, XXT sit to stand, and TKE. Utilize manual techniques as appropriate to augment therapy and for pain manageent.     Goals Home Exercise Program Pt/caregiver will Perform Home Exercise Program: For increased ROM;For increased strengthening;For improved balance;Independently PT Goal: Perform Home Exercise Program - Progress: Goal set today PT Short Term Goals Time to Complete Short Term Goals: 4 weeks PT Short Term Goal 1: Patient will improve knee extension to 3 degrees from full extension to increased stride length during gait PT Short Term Goal 2: Patient will increase knee flexion to 120 degrees so patient can squat to low chair to work in garden PT Short Term Goal 3: Patient will have 4/5 knee flexion/strength so patient can ambulate up stairs without difficulty PT Long Term Goals Time to Complete Long Term Goals: 12 weeks PT Long Term Goal 1: Patient will improve knee extension to full ROM to increased stride length during gait PT Long Term Goal 2: Patient will increase knee flexion to 125 degrees so patient can squat  heel so she can work efficiently in her garden.  Long Term Goal 3: Patient will have 5/5 knee flexion/extension strength so she can return to running.  Long Term Goal 4: Patient will be able to perform single leg squat >90 degrees and >20 repetitions in 1 minute indicating patient is safe to begin return to sport training.   Problem List Patient Active Problem List   Diagnosis Date Noted  . Pain in joint, lower leg 08/30/2013  . Migraines 01/21/2013    PT - End of Session Activity Tolerance: Patient tolerated treatment well General Behavior During Therapy: WFL for tasks assessed/performed PT Plan of Care PT Home Exercise Plan: patent given 3 way hamstring stretch, 3 way calf stretch, and 3 way quad stretch all in NWB and heel slides, SAQ, and 3 way bridges to improve knee mobility and strength.  PT Patient Instructions: 2x daily  Consulted and Agree with Plan of Care: Patient  GP Functional Assessment Tool Used: [name] R PT DPT 08/30/2013, 4:38 PM  Physician Documentation Your signature is required to indicate approval of the treatment plan as stated above.  Please sign and either send electronically or make a copy of this report for your files and return this physician signed original.   Please mark one 1.__approve of plan  2. ___approve of plan with the following conditions.   ______________________________  _____________________ Physician Signature                                                                                                             Date

## 2013-09-01 ENCOUNTER — Ambulatory Visit (HOSPITAL_COMMUNITY)
Admission: RE | Admit: 2013-09-01 | Discharge: 2013-09-01 | Disposition: A | Discharge status: Home / Self Care | Payer: BC Managed Care – PPO | Source: Ambulatory Visit | Attending: Family Medicine | Admitting: Family Medicine

## 2013-09-01 DIAGNOSIS — M79609 Pain in unspecified limb: Secondary | ICD-10-CM | POA: Insufficient documentation

## 2013-09-01 DIAGNOSIS — M6281 Muscle weakness (generalized): Secondary | ICD-10-CM | POA: Insufficient documentation

## 2013-09-01 DIAGNOSIS — IMO0001 Reserved for inherently not codable concepts without codable children: Secondary | ICD-10-CM | POA: Insufficient documentation

## 2013-09-01 DIAGNOSIS — R262 Difficulty in walking, not elsewhere classified: Secondary | ICD-10-CM | POA: Insufficient documentation

## 2013-09-01 NOTE — Progress Notes (Signed)
Physical Therapy Treatment Patient Details  Name: [name] MRN: [id number] Date of Birth: Apr 20, 1951  Today's Date: 09/01/2013 Time: 1430-1520 PT Time Calculation (min): 50 min Charges: Manual 1430-1440, Therapeutic Exercise 1440 - 1520  Visit#: 2 of 24  Re-eval: 09/29/13 Assessment Diagnosis: Difficulty walking secondary to limited knee mobilit and weakness following Lt Tibial plateau fracture Surgical Date: 07/03/13 Prior Therapy: no  Authorization: BCBS   Subjective: Symptoms/Limitations Symptoms: Patient notes pain at end rage with quad stretch, that she has been having low back pain on Rt side.  Pertinent History: Patient fell on left side breaking tibia and knee on Lt side. Patient also hasl N/T and pain in Lt UE, and had x-ray that indicated no UE or neck fracture. Patient is to be 50% weight bearing until May itgh brace set at 10 to 100 degrees of motion  Limitations: Standing;Walking How long can you walk comfortably?: 1 hour Repetition: Decreases Symptoms Patient Stated Goals: to get back to running, walking, squatting onto the floor.  Pain Assessment Currently in Pain?: Yes Pain Score: 3  Pain Location: Knee Pain Orientation: Left Pain Type: Acute pain;Chronic pain Pain Onset: More than a month ago  Exercise/Treatments Stretches Active Hamstring Stretch: 3 reps;30 seconds (3way) Quad Stretch: 3 reps;30 seconds (3 way) Gastroc Stretch: 3 reps;30 seconds (3 way)   Supine Heel Slides: 10 reps;Left;AROM Other Supine Knee Exercises: Bridges XXT 10x each Other Supine Knee Exercises: Supine march in hook laying 10x 3seconds Sidelying Hip ABduction: 10 reps Clams: 10x  Manual Therapy Manual Therapy: Edema management Edema Management: retro massage along foot, tibia, and knee to decrease swelling Joint Mobilization: Grade 3-4 Tibia PA/Ap to improve knee flexion/extension in prone, grade 4 Rt innominate posterior tilt mobilization in Lt side  laying  Physical Therapy Assessment and Plan PT Assessment and Plan Clinical Impression Statement: Patient displays improving decreased ROM and weakness. Patient has particularly increased pain at end range knee flexion (95 degrees), thouh this is improved following joint mobilizations. Today, the patient also stated that she has been having low back pain since her surgery, which upon further investigated, was found to be due to hip misalignment with her Rt innominate anteriorly tilted > Lt  innominate; following MET and joint mobilization patient stated no back pain and improved ability to actively flex Lt knee.  Patient displays a good response to initial exercises with increased ROM and good, but weak muscle activation. Patient is currently on 50% weight bearing precautions. At patient's request, she is appropriate for PT 1x a week for 4 weeks, then after removal of WBing precautions patient will be appropriate for 2-3x a week for strengthening and return to sport training. Pt will benefit from skilled therapeutic intervention in order to improve on the following deficits: Abnormal gait;Decreased activity tolerance;Decreased balance;Decreased strength;Difficulty walking;Impaired flexibility;Decreased range of motion;Decreased mobility;Pain Rehab Potential: Good PT Frequency: Min 2X/week PT Duration: 12 weeks PT Treatment/Interventions: Gait training;Stair training;Functional mobility training;Therapeutic activities;Therapeutic exercise;Balance training;Patient/family education;Manual techniques PT Plan: Progress knee mobility and strength as tolerated per 50% weight bearign preacaution. Next sesion add piriformis S, Calf raises, SFT squat as appropriate. Utilize manual techniques as appropriate to augment therapy and for pain manageent.     Goals Home Exercise Program Pt/caregiver will Perform Home Exercise Program: For increased ROM;For increased strengthening;For improved balance;Independently PT  Short Term Goals Time to Complete Short Term Goals: 4 weeks PT Short Term Goal 1: Patient will improve knee extension to 3 degrees from full extension to increased stride length  during gait PT Short Term Goal 1 - Progress: Progressing toward goal PT Short Term Goal 2: Patient will increase knee flexion to 120 degrees so patient can squat to low chair to work in garden PT Short Term Goal 2 - Progress: Progressing toward goal PT Short Term Goal 3: Patient will have 4/5 knee flexion/strength so patient can ambulate up stairs without difficulty PT Short Term Goal 3 - Progress: Progressing toward goal PT Short Term Goal 4: Increase Lt hip abduction strength to 4+/5 to improve weight shift during gait PT Short Term Goal 4 - Progress: Progressing toward goal PT Long Term Goals PT Long Term Goal 1: Patient will improve knee extension to full ROM to increased stride length during gait PT Long Term Goal 1 - Progress: Progressing toward goal PT Long Term Goal 2: Patient will increase knee flexion to 125 degrees so patient can squat heel so she can work efficiently in her garden.  PT Long Term Goal 2 - Progress: Progressing toward goal Long Term Goal 3: Patient will have 5/5 knee flexion/extension strength so she can return to running.  Long Term Goal 3 Progress: Progressing toward goal Long Term Goal 4: Patient will be able to perform single leg squat >90 degrees and >20 repetitions in 1 minute indicating patient is safe to begin return to sport training.  Long Term Goal 4 Progress: Progressing toward goal PT Long Term Goal 5: Increase Lt hip abduction strength to 5/5 to improve single leg stand and weight shifting durign running Long Term Goal 5 Progress: Progressing toward goal  Problem List Patient Active Problem List   Diagnosis Date Noted  . Pain in joint, lower leg 08/30/2013  . Migraines 01/21/2013    General Behavior During Therapy: WFL for tasks assessed/performed PT Plan of Care PT  Home Exercise Plan: patent given supine marching, 3 way hamstring stretch, 3 way calf stretch, and 3 way quad stretch all in NWB and heel slides, SAQ, and 3 way bridges to improve knee mobility and strength.  PT Patient Instructions: 2x daily  Consulted and Agree with Plan of Care: Patient  GP Functional Assessment Tool Used: Vernard Gambles, Lavilla Delamora R PT DPT 09/01/2013, 3:37 PM

## 2013-09-07 ENCOUNTER — Ambulatory Visit (HOSPITAL_COMMUNITY)
Admission: RE | Admit: 2013-09-07 | Discharge: 2013-09-07 | Disposition: A | Discharge status: Home / Self Care | Payer: BC Managed Care – PPO | Source: Ambulatory Visit | Attending: Physical Therapy | Admitting: Physical Therapy

## 2013-09-07 NOTE — Progress Notes (Signed)
Physical Therapy Treatment Patient Details  Name: [name] MRN: [id number] Date of Birth: November 26, 1950  Today's Date: 09/07/2013 Time: 1650-1730 PT Time Calculation (min): 40 min Manual therapy 1650-1720 , There Ex 1720-1730  Visit#: 3 of 24  Re-eval: 09/29/13 Assessment Diagnosis: Difficulty walking secondary to limited knee mobilit and weakness following Lt Tibial plateau fracture Surgical Date: 07/03/13 Prior Therapy: no  Authorization: BCBS   Subjective: Symptoms/Limitations Symptoms: Patient's husband fell on friday propmting her to run during which she felt her knee and ankle twist. Patient's L ankle displays significant edema, no discoloration. Patient states that she knew better but forgot not to run due to the emergency of her husband falling. Patient states that following Thursday recenent therapy session she felt great and was moving very well.  Pertinent History: Patient fell on left side breaking tibia and knee on Lt side. Patient also hasl N/T and pain in Lt UE, and had x-ray that indicated no UE or neck fracture. Patient is to be 50% weight bearing until May itgh brace set at 10 to 100 degrees of motion  Limitations: Standing;Walking How long can you stand comfortably?: 1 hour How long can you walk comfortably?: 1 hour Repetition: Decreases Symptoms Patient Stated Goals: to get back to running, walking, squatting onto the floor.  Pain Assessment Currently in Pain?: Yes Pain Score: 5  Pain Location: Knee Pain Orientation: Left Pain Type: Chronic pain Pain Onset: More than a month ago Pain Frequency: Intermittent Multiple Pain Sites: Yes  Precautions/Restrictions     Exercise/Treatments Stretches Gastroc Stretch: 3 reps;30 seconds (3 way) Seated Other Seated Knee Exercises: Ankle ABC's both ways.  Supine Heel Slides: 10 reps;Left;AROM  Manual Therapy Manual Therapy: Edema management Edema Management: retro massage along foot, tibia, and knee to  decrease swellingtaken  Joint Mobilization: Grade 3-4 Tibia PA/Ap to improve knee flexion/extension in prone, grade 4 Rt innominate posterior tilt mobilization in Lt side laying  Physical Therapy Assessment and Plan PT Assessment and Plan Clinical Impression Statement: Patient presents to therapy following twisting her knee  and ankle while jumping up and running to help her husband who fell. Patient's ankel is significantly swollen and tender to palpation. Ankle and knee displays no signs of ligamentous instability, though due to extreme pain along medial eicondyle and inferior tlateral tibia therapist suspects possible ankle fracture and was instructed to visit PCP if ankle does not improve in 3 days. Patient's knee is tender over medial fat pad beside tibial tuberosity. Patient responded well to retro massage for swelling and  gentle ROM exercises to enhance blood flow and ROM. Patient displays improving knee ROM.  Pt will benefit from skilled therapeutic intervention in order to improve on the following deficits: Abnormal gait;Decreased activity tolerance;Decreased balance;Decreased strength;Difficulty walking;Impaired flexibility;Decreased range of motion;Decreased mobility;Pain Rehab Potential: Good PT Frequency: Min 2X/week PT Duration: 12 weeks PT Treatment/Interventions: Gait training;Stair training;Functional mobility training;Therapeutic activities;Therapeutic exercise;Balance training;Patient/family education;Manual techniques PT Plan: Reassess symptoms next session and if appropriate Progress knee mobility and strength as tolerated per 50% weight bearign preacaution. Next sesion add piriformis S, Calf raises, SFT squat as appropriate. Utilize manual techniques as appropriate to augment therapy and for pain manageent.     Goals Home Exercise Program Pt/caregiver will Perform Home Exercise Program: For increased ROM;For increased strengthening;For improved balance;Independently PT Short  Term Goals PT Short Term Goal 1 - Progress: Met PT Short Term Goal 2 - Progress: Progressing toward goal PT Short Term Goal 3: Patient will have 4/5 knee flexion/strength  so patient can ambulate up stairs without difficulty PT Short Term Goal 3 - Progress: Progressing toward goal PT Short Term Goal 4 - Progress: Progressing toward goal PT Long Term Goals PT Long Term Goal 1 - Progress: Progressing toward goal PT Long Term Goal 2 - Progress: Progressing toward goal Long Term Goal 3 Progress: Progressing toward goal Long Term Goal 4 Progress: Progressing toward goal Long Term Goal 5 Progress: Progressing toward goal  Problem List Patient Active Problem List   Diagnosis Date Noted  . Pain in joint, lower leg 08/30/2013  . Migraines 01/21/2013    PT - End of Session Activity Tolerance: Patient tolerated treatment well General Behavior During Therapy: [facility] for tasks assessed/performed  GP  DeWitt, Cash R PT, DPT 09/07/2013, 7:08 PM

## 2013-09-14 ENCOUNTER — Ambulatory Visit (HOSPITAL_COMMUNITY)
Admission: RE | Admit: 2013-09-14 | Discharge: 2013-09-14 | Disposition: A | Discharge status: Home / Self Care | Payer: BC Managed Care – PPO | Source: Ambulatory Visit | Attending: Family Medicine | Admitting: Family Medicine

## 2013-09-14 NOTE — Progress Notes (Addendum)
Physical Therapy Treatment Patient Details  Name: [name] MRN: [id number] Date of Birth: 05/11/1951  Today's Date: 09/14/2013 Time: 6767-2094 PT Time Calculation (min): 45 min Charges: Manual therapy 1645-1710, Therapeutic Exercise 1710-1730  Visit#: 4 of 24  Re-eval: 09/29/13 Assessment Diagnosis: Difficulty walking secondary to limited knee mobilit and weakness following Lt Tibial plateau fracture Surgical Date: 07/03/13 Prior Therapy: no  Authorization: BCBS  Authorization Time Period:    Authorization Visit#:   of     Subjective: Symptoms/Limitations Symptoms: Patient continues to have ankle and knee pain on left following recent fall.  Pain Assessment Currently in Pain?: Yes Pain Score: 5   Precautions/Restrictions     Exercise/Treatments  Standing Functional Squat:  (sagittal transverse squatting 5 reps x 7 sets to 19" ) Lunge to 14" box with 3 way reach 10x each Seated Other Seated Knee Exercises: Ankle ABC's both ways.  Supine Heel Slides: 10 reps;Left;AROM Other Supine Knee Exercises: Sagittal Transvers bridges 5repetitions x 9sets  Manual Therapy Manual Therapy: Edema management Edema Management: retro massage along foot, tibia, and knee to decrease swellingtaken figure 8 measurement of Lt ankle improved from 50.5 to 49.0 cm  Physical Therapy Assessment and Plan PT Assessment and Plan Clinical Impression Statement: Patient continues to display significant Lt ankle and Lt knee swelling following twisting her knee and ankle while jumping up and running to help her husband who fell 2 weeks ago. Patient's ankle is significantly swollen and tender to palpation along medical malleolus and medial shin, though there is no discoloration. Ankle and knee displays no signs of ligamentous instability, though due to extreme pain along medial epicondyle and inferior lateral tibia therapist suspects possible minor ligament or Achilles tear. Patient's knee is tender  over medial fat pad beside tibial tuberosity. Patient responded well to retro massage for swelling and gentle ROM exercises to enhance blood flow and ROM. Ankle figure eight measurement for swelling: Rt 47.7, Lt 50.5 indicating increased Lt Ankle swelling 49.0 cm on Lt after manual massage for swelling to remove the edema. Patient displayed improving knee ROM and pain free motion with all strengthening exercises.  Pt will benefit from skilled therapeutic intervention in order to improve on the following deficits: Abnormal gait;Decreased activity tolerance;Decreased balance;Decreased strength;Difficulty walking;Impaired flexibility;Decreased range of motion;Decreased mobility;Pain Rehab Potential: Good PT Frequency: Min 2X/week PT Duration: 12 weeks PT Treatment/Interventions: Gait training;Stair training;Functional mobility training;Therapeutic activities;Therapeutic exercise;Balance training;Patient/family education;Manual techniques PT Plan: Continue to address ankle and knee swelling and focus on improving knee strength.  Introduce standing hip flexor stretch and groin strech. Contiue squatting and l lunging to 14" box.     Goals Home Exercise Program PT Goal: Perform Home Exercise Program - Progress: Progressing toward goal PT Short Term Goals PT Short Term Goal 2 - Progress: Progressing toward goal PT Short Term Goal 3 - Progress: Progressing toward goal PT Short Term Goal 4 - Progress: Progressing toward goal PT Long Term Goals PT Long Term Goal 1 - Progress: Progressing toward goal PT Long Term Goal 2 - Progress: Progressing toward goal Long Term Goal 3 Progress: Progressing toward goal Long Term Goal 4 Progress: Progressing toward goal Long Term Goal 5 Progress: Progressing toward goal  Problem List Patient Active Problem List   Diagnosis Date Noted  . Pain in joint, lower leg 08/30/2013  . Migraines 01/21/2013    PT - End of Session Activity Tolerance: Patient tolerated  treatment well  GP    [name] PT DPT 09/14/2013, 6:44 PM

## 2013-09-21 ENCOUNTER — Ambulatory Visit (HOSPITAL_COMMUNITY)
Admission: RE | Admit: 2013-09-21 | Discharge: 2013-09-21 | Disposition: A | Discharge status: Home / Self Care | Payer: BC Managed Care – PPO | Source: Ambulatory Visit | Attending: Physical Therapy | Admitting: Physical Therapy

## 2013-09-21 NOTE — Evaluation (Signed)
Physical Therapy Re-Assessment  Patient Details  Name: [name] MRN: [id number] Date of Birth: May 23, 1951  Today's Date: 09/21/2013 Time: 0737-1062 PT Time Calculation (min): 45 min   1645-1710 Manual therapy, 1710-1730 therapeutic Exercise          Visit#: 5 of 24  Re-eval: 09/29/13 Assessment Diagnosis: Difficulty walking secondary to limited knee mobilit and weakness following Lt Tibial plateau fracture Surgical Date: 07/03/13 Prior Therapy: no  Authorization: BCBS     Authorization Visit#: 5 of 24   Past Medical History:  Past Medical History  Diagnosis Date  . Allergy   . Hyperlipidemia   . Migraines     Chronic  . Sinusitis   . Acid reflux    Past Surgical History:  Past Surgical History  Procedure Laterality Date  . Tubal ligation    . Appendectomy    . Cholecystectomy    . Tonsillectomy and adenoidectomy    . Colonoscopy    . Breast enhancement surgery  1985    Subjective Symptoms/Limitations Symptoms: patient Notes contineud ankle shin pain that limits her mobility and minor knee pain follwoign a fall from two weeks ago Pertinent History: Patient fell on left side breaking tibia and knee on Lt side. Patient also hasl N/T and pain in Lt UE, and had x-ray that indicated no UE or neck fracture. Patient is to be 50% weight bearing until May itgh brace set at 10 to 100 degrees of motion  Limitations: Standing;Walking How long can you stand comfortably?: 1 hour How long can you walk comfortably?: 1 hour Repetition: Decreases Symptoms Patient Stated Goals: to get back to running, walking, squatting onto the floor.   Precautions/Restrictions  Restrictions Weight Bearing Restrictions: Yes LLE Weight Bearing: Partial weight bearing LLE Partial Weight Bearing Percentage or Pounds: 50%  Sensation/Coordination/Flexibility/Functional Tests Functional Tests Functional Tests: Rt 47.7, Lt 48.5cm figure 8 measurement Functional Tests: FOTO: 55% limitation,  45% functional status  Assessment LLE AROM (degrees) Left Hip Flexion: 120 Left Knee Extension: 0 Left Knee Flexion: 126 Left Ankle Dorsiflexion: 15 Left Ankle Plantar Flexion: 50 LLE Strength Left Hip Flexion: 4/5 Left Knee Flexion: 4/5 Left Knee Extension: 4/5 Left Ankle Dorsiflexion: 4/5  Exercise/Treatments Standing Heel Raises: 15 reps (heel toe raises) Functional Squat: 3 sets;10 reps ine Other Supine Knee Exercises: Sagittal Transvers bridges 5repetitions x 9sets  Physical Therapy Assessment and Plan PT Assessment and Plan Clinical Impression Statement: Patient displays improved knee AROM to near full AROM (0-125) and displays improving knee strength to 4/5 manual; muscle testing of all LT LE muscles. Patient continues to have Lt ankel swelling swelling after recent fall though it is much improved from 50.5 cm to 48.5cm with ankle figue 8 measurement. Patient progress has reached ceilign with current weight bearign precautions and is awaitign removal of procautions to further progress patient''s strength and mobbility to improve strength needed for dynamic activities including running.  Pt will benefit from skilled therapeutic intervention in order to improve on the following deficits: Abnormal gait;Decreased activity tolerance;Decreased balance;Decreased strength;Difficulty walking;Impaired flexibility;Decreased range of motion;Decreased mobility;Pain PT Frequency: Min 2X/week PT Duration: 6 weeks PT Treatment/Interventions: Gait training;Stair training;Functional mobility training;Therapeutic activities;Therapeutic exercise;Balance training;Patient/family education;Manual techniques PT Plan: Patient will continue to benefit from skilled physical therapy with focus on progressing depth of loadign and lower extremeity sttrength.     Goals Home Exercise Program Pt/caregiver will Perform Home Exercise Program: For increased ROM;For increased strengthening;For improved  balance;Independently PT Goal: Perform Home Exercise Program - Progress: Met PT  Short Term Goals PT Short Term Goal 1: Patient will improve knee extension to 3 degrees from full extension to increased stride length during gait PT Short Term Goal 1 - Progress: Met PT Short Term Goal 2: Patient will increase knee flexion to 120 degrees so patient can squat to low chair to work in garden PT Short Term Goal 2 - Progress: Met PT Short Term Goal 3: Patient will have 4/5 knee flexion/strength so patient can ambulate up stairs without difficulty PT Short Term Goal 3 - Progress: Met PT Short Term Goal 4: Increase Lt hip abduction strength to 4+/5 to improve weight shift during gait PT Short Term Goal 4 - Progress: Progressing toward goal PT Long Term Goals PT Long Term Goal 1: Patient will improve knee extension to full ROM to increased stride length during gait PT Long Term Goal 1 - Progress: Met PT Long Term Goal 2: Patient will increase knee flexion to 125 degrees so patient can squat heel so she can work efficiently in her garden.  PT Long Term Goal 2 - Progress: Met Long Term Goal 3: Patient will have 5/5 knee flexion/extension strength so she can return to running.  Long Term Goal 3 Progress: Progressing toward goal Long Term Goal 4: Patient will be able to perform single leg squat >90 degrees and >20 repetitions in 1 minute indicating patient is safe to begin return to sport training.  Long Term Goal 4 Progress: Progressing toward goal PT Long Term Goal 5: Increase Lt hip abduction strength to 5/5 to improve single leg stand and weight shifting durign running Long Term Goal 5 Progress: Progressing toward goal  Problem List Patient Active Problem List   Diagnosis Date Noted  . Pain in joint, lower leg 08/30/2013  . Migraines 01/21/2013    PT - End of Session Activity Tolerance: Patient tolerated treatment well  GP    Jaquan Sadowsky R Kashtyn Jankowski PT DPT 09/21/2013, 6:42 PM  Physician  Documentation Your signature is required to indicate approval of the treatment plan as stated above.  Please sign and either send electronically or make a copy of this report for your files and return this physician signed original.   Please mark one 1.__approve of plan  2. ___approve of plan with the following conditions.   ______________________________                                                          _____________________ Physician Signature                                                                                                             Date

## 2013-09-28 ENCOUNTER — Ambulatory Visit (HOSPITAL_COMMUNITY)
Admission: RE | Admit: 2013-09-28 | Discharge: 2013-09-28 | Disposition: A | Discharge status: Home / Self Care | Payer: BC Managed Care – PPO | Source: Ambulatory Visit | Attending: Physical Therapy | Admitting: Physical Therapy

## 2013-09-28 NOTE — Progress Notes (Signed)
Physical Therapy Treatment Patient Details  Name: [name] MRN: [id number] Date of Birth: 1950-07-27  Today's Date: 09/28/2013 Time: 2505-3976 PT Time Calculation (min): 45 min  Charges: Manual [id number] Manual therapy, 1655-1730 TherEx Visit#: 7 of 24  Re-eval: 10/22/13 Assessment Diagnosis: Difficulty walking secondary to limited knee mobilit and weakness following Lt Tibial plateau fracture Surgical Date: 07/03/13 Prior Therapy: no  Authorization: BCBS  Authorization Time Period:    Authorization Visit#: 6 of 24   Subjective: Symptoms/Limitations Symptoms: Patient recently saw MD who stated he was very pleased with her progress, does not suspect ankle fracture, suspects possible achilles tear. Patient will see MD in 4 weeks. States that he would like PT to call with request for order for future number of visits. No weight bearing precautions.  Pertinent History: Patient fell on left side breaking tibia and knee on Lt side. Patient also hasl N/T and pain in Lt UE, and had x-ray that indicated no UE or neck fracture. Patient is to be 50% weight bearing until May itgh brace set at 10 to 100 degrees of motion  Limitations: Standing;Walking How long can you stand comfortably?: 1 hour How long can you walk comfortably?: 1 hour Repetition: Decreases Symptoms Patient Stated Goals: to get back to running, walking, squatting onto the floor.  Pain Assessment Currently in Pain?: Yes Pain Score: 1  Pain Location: Knee Pain Orientation: Left Pain Type: Chronic pain Pain Onset: More than a month ago Pain Frequency: Intermittent  Precautions/Restrictions  Restrictions Weight Bearing Restrictions: No  Exercise/Treatments Stretches Active Hamstring Stretch: Limitations Active Hamstring Stretch Limitations: 3way 10x  3sec hold to 14in box Quad Stretch: Limitations Quad Stretch Limitations: 10x 3sec hold Hip Flexor Stretch: Limitations Hip Flexor Stretch Limitations: 3way 10x  3sec hold ITB Stretch: Limitations ITB Stretch Limitations: 10x 3 sec hold Piriformis Stretch: Limitations Piriformis Stretch Limitations: seated leg crossed 3x 10 seconds Gastroc Stretch: Limitations Gastroc Stretch Limitations: Wedge, 3 way 10x 3sec Standing Other Standing Knee Exercises: 2 way groin stretch Other Standing Knee Exercises: 3D ankle excursions 10x    Manual Therapy Manual Therapy: Myofascial release Joint Mobilization: Grade 3-4 Tibia PA/Ap to improve knee flexion/extension 16min  Myofascial Release: quadraceps muscle 76min  Physical Therapy Assessment and Plan PT Assessment and Plan Clinical Impression Statement:  Due to all weight bearing restriction being removed introduces all standing stretches this seesion to improve LE mobility and gait. Following stretches patient stated no pain and no more back, hip, or knee discomfort. Patient themn demosntrated gait with improved stride length. Next session to begin strengthenig to furth improve gait and activity tolerance Pt will benefit from skilled therapeutic intervention in order to improve on the following deficits: Abnormal gait;Decreased activity tolerance;Decreased balance;Decreased strength;Difficulty walking;Impaired flexibility;Decreased range of motion;Decreased mobility;Pain Rehab Potential: Good PT Frequency: Min 2X/week PT Duration: 6 weeks PT Treatment/Interventions: Gait training;Stair training;Functional mobility training;Therapeutic activities;Therapeutic exercise;Balance training;Patient/family education;Manual techniques PT Plan: Introduce bike, Review LE stretches, Introduce squat matrix and lunged to 8" box to initiated full weightbearing strengthening.     Goals    Problem List Patient Active Problem List   Diagnosis Date Noted  . Pain in joint, lower leg 08/30/2013  . Migraines 01/21/2013    PT - End of Session Activity Tolerance: Patient tolerated treatment well General Behavior During  Therapy: WFL for tasks assessed/performed PT Plan of Care PT Home Exercise Plan: patent given supine marching, 3 way hamstring stretch, 3 way calf stretch, and 3 way quad stretch all in NWB and  heel slides, SAQ, and 3 way bridges to improve knee mobility and strength.  PT Patient Instructions: 2x daily  Consulted and Agree with Plan of Care: Patient  GP  Suzette Battiest Nesbit Michon PT DPT 09/28/2013, 6:57 PM

## 2013-10-01 ENCOUNTER — Telehealth: Payer: Self-pay | Admitting: Family Medicine

## 2013-10-01 NOTE — Telephone Encounter (Signed)
[name] left message on my voicemail, stated that Dr. Nicki Reaper wanted his recommendation on pt's length of need for PT, he recommends twice a week for six weeks

## 2013-10-03 NOTE — Telephone Encounter (Signed)
I am not sure what I need to do with that? If it is a new order that they need please let him know otherwise I would let it go thank you.

## 2013-10-05 NOTE — Telephone Encounter (Signed)
[facility] for Cash, need to know if below info was just an "FYI" or if we need to send new order

## 2013-10-13 ENCOUNTER — Ambulatory Visit (HOSPITAL_COMMUNITY): Payer: BC Managed Care – PPO | Admitting: Physical Therapy

## 2013-10-20 ENCOUNTER — Ambulatory Visit (HOSPITAL_COMMUNITY)
Admission: RE | Admit: 2013-10-20 | Discharge: 2013-10-20 | Disposition: A | Discharge status: Home / Self Care | Payer: BC Managed Care – PPO | Source: Ambulatory Visit | Attending: Family Medicine | Admitting: Family Medicine

## 2013-10-20 DIAGNOSIS — IMO0001 Reserved for inherently not codable concepts without codable children: Secondary | ICD-10-CM | POA: Insufficient documentation

## 2013-10-20 DIAGNOSIS — M79609 Pain in unspecified limb: Secondary | ICD-10-CM | POA: Insufficient documentation

## 2013-10-20 DIAGNOSIS — R262 Difficulty in walking, not elsewhere classified: Secondary | ICD-10-CM | POA: Insufficient documentation

## 2013-10-20 DIAGNOSIS — M6281 Muscle weakness (generalized): Secondary | ICD-10-CM | POA: Insufficient documentation

## 2013-10-20 NOTE — Progress Notes (Signed)
Physical Therapy Treatment Patient Details  Name: [name] MRN: [id number] Date of Birth: 1950-11-15  Today's Date: 10/20/2013 Time: 2111-7356 PT Time Calculation (min): 45 min   Charges: Manual 7014-1030, TherEx [id number]+ Visit#: 8 of 24  Re-eval: 10/22/13 Assessment Diagnosis: Difficulty walking secondary to limited knee mobilit and weakness following Lt Tibial plateau fracture Surgical Date: 07/03/13 Prior Therapy: no  Authorization: BCBS  Authorization Time Period:    Authorization Visit#: 7 of 24   Subjective: Symptoms/Limitations Symptoms: Patient notes increased pain today with increased sweeling in Lt knee and ankle along with Rt knee.  Pain Assessment Pain Score: 3   Precautions/Restrictions  Restrictions Weight Bearing Restrictions: Yes LLE Weight Bearing: Partial weight bearing LLE Partial Weight Bearing Percentage or Pounds: 50%  Exercise/Treatments Stretches Active Hamstring Stretch: Limitations;5 reps Active Hamstring Stretch Limitations: 3way 10x  3sec hold to 14in box Gastroc Stretch Limitations: 4x10sec at wall Standing Other Standing Knee Exercises: Squat matrix 5x, 3way half kneeling hip drives  Other Standing Knee Exercises: 3D ankle excursions 10x Seated Other Seated Knee Exercises: Ankle ABC's both ways.   Manual Therapy Edema Management: retro massage along ankle, tibia, and knee to decrease swelling  Physical Therapy Assessment and Plan PT Assessment and Plan Clinical Impression Statement: Patient has increased swelling in bilateral LE with Lt LE displaying excessive swelling from Lt ankle/ foot to upper thigh.  Patient swelling improved with elevation and massage but returned following exercise. Patient demonstrated improvements with pain and mobility following stretches though swelling returned shortly afterwards. Patient also responded well to initial loading/strengthening exercises with patient being able to perform squats with good  techniques with minimal pain throughout. However patient noted increased pain with increased single leg loading.   PT Plan: Contineu bike, Review LE stretches, Introduce squat matrix and lunged to 8" box to initiated full weightbearing strengthening.     Goals PT Short Term Goals PT Short Term Goal 4: Increase Lt hip abduction strength to 4+/5 to improve weight shift during gait PT Short Term Goal 4 - Progress: Progressing toward goal PT Long Term Goals Long Term Goal 3: Patient will have 5/5 knee flexion/extension strength so she can return to running.  Long Term Goal 3 Progress: Progressing toward goal Long Term Goal 4: Patient will be able to perform single leg squat >90 degrees and >20 repetitions in 1 minute indicating patient is safe to begin return to sport training.  Long Term Goal 4 Progress: Progressing toward goal PT Long Term Goal 5: Increase Lt hip abduction strength to 5/5 to improve single leg stand and weight shifting durign running Long Term Goal 5 Progress: Progressing toward goal  Problem List Patient Active Problem List   Diagnosis Date Noted  . Pain in joint, lower leg 08/30/2013  . Migraines 01/21/2013    PT - End of Session Activity Tolerance: Patient tolerated treatment well General Behavior During Therapy: [facility] for tasks assessed/performed  GP    Yovanni Frenette R Jesstin Studstill 10/20/2013, 6:44 PM

## 2013-10-22 ENCOUNTER — Ambulatory Visit (HOSPITAL_COMMUNITY)
Admission: RE | Admit: 2013-10-22 | Discharge: 2013-10-22 | Disposition: A | Discharge status: Home / Self Care | Payer: BC Managed Care – PPO | Source: Ambulatory Visit | Attending: Physical Therapy | Admitting: Physical Therapy

## 2013-10-22 NOTE — Evaluation (Signed)
Physical Therapy Reassessment  Patient Details  Name: [name] MRN: [id number] Date of Birth: 09-05-1950  Today's Date: 10/22/2013 Time: 5170-0174 PT Time Calculation (min): 45 min   therEx [id number]           Visit#: 9 of 24  Re-eval: 11/21/13 Assessment Diagnosis: Difficulty walking secondary to limited knee mobilit and weakness following Lt Tibial plateau fracture Surgical Date: 07/03/13 Prior Therapy: no  Authorization: BCBS    Authorization Visit#: 9 of   24  Past Medical History:  Past Medical History  Diagnosis Date  . Allergy   . Hyperlipidemia   . Migraines     Chronic  . Sinusitis   . Acid reflux    Past Surgical History:  Past Surgical History  Procedure Laterality Date  . Tubal ligation    . Appendectomy    . Cholecystectomy    . Tonsillectomy and adenoidectomy    . Colonoscopy    . Breast enhancement surgery  1985    Subjective Symptoms/Limitations Symptoms: Patient states having improved though continuing pain this session  Precautions/Restrictions  Restrictions Weight Bearing Restrictions: No  Assessment LLE AROM (degrees) Left Hip Flexion: 120 Left Knee Extension: 0 Left Knee Flexion: 126 Left Ankle Dorsiflexion: 15 Left Ankle Plantar Flexion: 50 LLE Strength Left Hip Flexion: 4/5 Left Knee Flexion: 4/5 Left Knee Extension: 4/5 Left Ankle Dorsiflexion: 4/5  Exercise/Treatments Stretches Active Hamstring Stretch: Limitations;5 reps Active Hamstring Stretch Limitations: 3way 10x  3sec hold to 14in box Quad Stretch: Limitations Quad Stretch Limitations: 10x 3sec hold Hip Flexor Stretch: Limitations Hip Flexor Stretch Limitations: 3way 10x 3sec hold ITB Stretch: Limitations ITB Stretch Limitations: 10x 3 sec hold Gastroc Stretch: Limitations Gastroc Stretch Limitations: Wedge, 3 way 10x 3sec Standing Forward Lunges: 2 sets;5 reps (8" box) Side Lunges: 2 sets;5 reps (8") Other Standing Knee Exercises: 3D ankle excursions  10x g   Manual Therapy Joint Mobilization: Grade 3-4 talus anterior to posterior mobilization to improve ankle knee flexion/extension  Physical Therapy Assessment and Plan PT Assessment and Plan Clinical Impression Statement: Patient continues to have increased swelling in bilateral LE with Lt LE displaying excessive swelling from Lt ankle/ foot to upper thigh. Patient swelling improved with elevation and massage but returned following exercise. Patient demonstrates improving strength,  pain and mobility following stretches and walking though swelling returned shortly afterwards. Patient also responded well to initial loading/strengthening exercises with patient being able to perform squats with good techniques with minimal pain throughout. However patient noted increased pain with increased single leg loading. PT Frequency: Min 2X/week PT Duration: 4 weeks PT Treatment/Interventions: Gait training;Stair training;Functional mobility training;Therapeutic activities;Therapeutic exercise;Balance training;Patient/family education;Manual techniques PT Plan: Continue PT 2x a week for 4 more weeks. Contineu bike, LE stretches, Iand strengthenig exercises with progressing exercises in intensity as appropriate per patient's strength and symptoms.     Goals PT Short Term Goals PT Short Term Goal 1: Patient will improve knee extension to 3 degrees from full extension to increased stride length during gait PT Short Term Goal 1 - Progress: Met PT Short Term Goal 2: Patient will increase knee flexion to 120 degrees so patient can squat to low chair to work in garden PT Short Term Goal 2 - Progress: Met PT Short Term Goal 3: Patient will have 4/5 knee flexion/strength so patient can ambulate up stairs without difficulty PT Short Term Goal 3 - Progress: Met PT Short Term Goal 4 - Progress: Progressing toward goal PT Long Term Goals PT Long Term  Goal 1: Patient will improve knee extension to full ROM to  increased stride length during gait PT Long Term Goal 1 - Progress: Met PT Long Term Goal 2: Patient will increase knee flexion to 125 degrees so patient can squat heel so she can work efficiently in her garden.  PT Long Term Goal 2 - Progress: Met Long Term Goal 3: Patient will have 5/5 knee flexion/extension strength so she can return to running.  Long Term Goal 3 Progress: Progressing toward goal Long Term Goal 4: Patient will be able to perform single leg squat >90 degrees and >20 repetitions in 1 minute indicating patient is safe to begin return to sport training.  Long Term Goal 4 Progress: Progressing toward goal PT Long Term Goal 5: Increase Lt hip abduction strength to 5/5 to improve single leg stand and weight shifting durign running Long Term Goal 5 Progress: Progressing toward goal  Problem List Patient Active Problem List   Diagnosis Date Noted  . Pain in joint, lower leg 08/30/2013  . Migraines 01/21/2013    PT - End of Session Activity Tolerance: Patient tolerated treatment well  GP    [name] [name] 10/22/2013, 7:18 PM  Physician Documentation Your signature is required to indicate approval of the treatment plan as stated above.  Please sign and either send electronically or make a copy of this report for your files and return this physician signed original.   Please mark one 1.__approve of plan  2. ___approve of plan with the following conditions.   ______________________________                                                          _____________________ Physician Signature                                                                                                             Date

## 2013-10-27 ENCOUNTER — Ambulatory Visit (HOSPITAL_COMMUNITY)
Admission: RE | Admit: 2013-10-27 | Discharge: 2013-10-27 | Disposition: A | Discharge status: Home / Self Care | Payer: BC Managed Care – PPO | Source: Ambulatory Visit | Attending: Physical Therapy | Admitting: Physical Therapy

## 2013-10-27 NOTE — Progress Notes (Addendum)
Physical Therapy Treatment Patient Details  Name: [name] MRN: [id number] Date of Birth: 1951-05-12  Today's Date: 10/27/2013 Time: 6195-0932 PT Time Calculation (min): 45 min    Charges: TherEx [id number], Manual 1720-1730 Visit#: 10 of 24  Re-eval: 11/21/13 Assessment Diagnosis: Difficulty walking secondary to limited knee mobilit and weakness following Lt Tibial plateau fracture Surgical Date: 07/03/13 Prior Therapy: no  Authorization: BCBS  Authorization Visit#: 10 of 24   Subjective: Symptoms/Limitations Symptoms: Patient staes feeling muct better with no pain this session.  Pain Assessment Currently in Pain?: No/denies  Exercise/Treatments Stretches Active Hamstring Stretch: Limitations;5 reps Active Hamstring Stretch Limitations: 3way 10x  3sec hold to 14in box Quad Stretch: Limitations Quad Stretch Limitations: 10x 3sec hold Hip Flexor Stretch: Limitations Hip Flexor Stretch Limitations: 3way 10x 3sec hold ITB Stretch: Limitations ITB Stretch Limitations: 10x 3 sec hold Gastroc Stretch: Limitations Gastroc Stretch Limitations: Wedge, 3 way 10x 3sec 2 way Groin Stretch 10x 3seconds hold  3way half kneeling hip drives  Standing Forward Lunges: 2 sets;5 reps (8" box) Side Lunges: 2 sets;5 reps (8") Other Standing Knee Exercises: Squat knee high reach matrix with 5lb dumbbells 5x, Other Standing Knee Exercises: 3D ankle excursions 10x  Manual Therapy Edema Management: retro massage along ankle, tibia, and knee to decrease swelling  Physical Therapy Assessment and Plan PT Assessment and Plan Clinical Impression Statement: Patient displays decreased swelling in bilateral LE with Lt LE. Patient swelling further improved with elevation, ice, and massage. Patient demonstrates improving strength, pain and mobility following stretches and walking though swelling returned shortly afterwards. Patient also responded well to loading/strengthening exercises with  patient being able to perform squats with good techniques with minimal pain throughout. However patient noted increased pain with increased single leg loading PT Plan: Contineu bike, LE stretches, and strengthenig exercises with progressing exercises in intensity as appropriate per patient's strength and symptoms.     Goals PT Short Term Goals PT Short Term Goal 4: Increase Lt hip abduction strength to 4+/5 to improve weight shift during gait PT Short Term Goal 4 - Progress: Progressing toward goal PT Long Term Goals Long Term Goal 3: Patient will have 5/5 knee flexion/extension strength so she can return to running.  Long Term Goal 3 Progress: Progressing toward goal Long Term Goal 4: Patient will be able to perform single leg squat >90 degrees and >20 repetitions in 1 minute indicating patient is safe to begin return to sport training.  Long Term Goal 4 Progress: Progressing toward goal PT Long Term Goal 5: Increase Lt hip abduction strength to 5/5 to improve single leg stand and weight shifting durign running Long Term Goal 5 Progress: Progressing toward goal  Problem List Patient Active Problem List   Diagnosis Date Noted  . Pain in joint, lower leg 08/30/2013  . Migraines 01/21/2013    PT - End of Session Activity Tolerance: Patient tolerated treatment well General Behavior During Therapy: [facility] for tasks assessed/performed  GP    Bonnye Halle R Kara Mierzejewski 10/27/2013, 5:14 PM

## 2013-10-29 ENCOUNTER — Inpatient Hospital Stay (HOSPITAL_COMMUNITY)
Admission: RE | Admit: 2013-10-29 | Payer: BC Managed Care – PPO | Source: Ambulatory Visit | Admitting: Physical Therapy

## 2013-11-03 ENCOUNTER — Inpatient Hospital Stay (HOSPITAL_COMMUNITY)
Admission: RE | Admit: 2013-11-03 | Discharge: 2013-11-03 | Disposition: A | Discharge status: Home / Self Care | Payer: BC Managed Care – PPO | Source: Ambulatory Visit | Attending: Physical Therapy | Admitting: Physical Therapy

## 2013-11-03 NOTE — Progress Notes (Signed)
Physical Therapy Treatment Patient Details  Name: [name] MRN: [id number] Date of Birth: 1951/05/13  Today's Date: 11/03/2013 Time: 6283-1517 PT Time Calculation (min): 45 min  TherEx 1645-1720, manual 1720-1730 Visit#: 12 of 24  Re-eval: 11/21/13 Assessment Diagnosis: Difficulty walking secondary to limited knee mobilit and weakness following Lt Tibial plateau fracture Surgical Date: 07/03/13 Prior Therapy: no  Authorization: BCBS  Authorization Visit#: 12 of 24   Subjective: Symptoms/Limitations Symptoms: patient states increased Lt knee pain.  Pain Assessment Currently in Pain?: Yes Pain Score: 4   Exercise/Treatments Stretches Active Hamstring Stretch: 10 seconds;4 reps Active Hamstring Stretch Limitations: 3way  14in box Hip Flexor Stretch: Limitations Hip Flexor Stretch Limitations: 2way groin 10x 3sec hold ITB Stretch: Limitations ITB Stretch Limitations: 4x10 sec hold Gastroc Stretch: Limitations;2 reps;20 seconds Gastroc Stretch Limitations: Wedge, 3 way Standing Other Standing Knee Exercises: squat reach matrix 5x each of 5 ways Other Standing Knee Exercises: 3D hip excursion   Physical Therapy Assessment and Plan PT Assessment and Plan Clinical Impression Statement: patient returns this session with increased Lt knee pain secodnary to limited hip and knee mobility. Following LE stretches patient pain was significantly improved for which she was educated in the importance of performance of her HEP. Patient also responded well to squat reach matrix noting no pain but that she chould "really feel my butt working".  PT Treatment/Interventions: Gait training;Stair training;Functional mobility training;Therapeutic activities;Therapeutic exercise;Balance training;Patient/family education;Manual techniques PT Plan: Contineu bike, LE stretches, and strengthenig exercises with progressing exercises in intensity as appropriate per patient's strength and symptoms.      Goals PT Short Term Goals PT Short Term Goal 1: Patient will improve knee extension to 3 degrees from full extension to increased stride length during gait PT Short Term Goal 1 - Progress: Met PT Short Term Goal 2: Patient will increase knee flexion to 120 degrees so patient can squat to low chair to work in garden PT Short Term Goal 2 - Progress: Met PT Short Term Goal 3: Patient will have 4/5 knee flexion/strength so patient can ambulate up stairs without difficulty PT Short Term Goal 3 - Progress: Met PT Short Term Goal 4: Increase Lt hip abduction strength to 4+/5 to improve weight shift during gait PT Short Term Goal 4 - Progress: Progressing toward goal PT Long Term Goals PT Long Term Goal 1: Patient will improve knee extension to full ROM to increased stride length during gait PT Long Term Goal 1 - Progress: Met PT Long Term Goal 2: Patient will increase knee flexion to 125 degrees so patient can squat heel so she can work efficiently in her garden.  PT Long Term Goal 2 - Progress: Met Long Term Goal 3: Patient will have 5/5 knee flexion/extension strength so she can return to running.  Long Term Goal 3 Progress: Progressing toward goal Long Term Goal 4: Patient will be able to perform single leg squat >90 degrees and >20 repetitions in 1 minute indicating patient is safe to begin return to sport training.  Long Term Goal 4 Progress: Progressing toward goal PT Long Term Goal 5: Increase Lt hip abduction strength to 5/5 to improve single leg stand and weight shifting durign running Long Term Goal 5 Progress: Progressing toward goal  Problem List Patient Active Problem List   Diagnosis Date Noted  . Pain in joint, lower leg 08/30/2013  . Migraines 01/21/2013    PT - End of Session Activity Tolerance: Patient tolerated treatment well General Behavior During Therapy: [facility]  for tasks assessed/performed  GP    Suzette Battiest Ferlando Lia 11/03/2013, 5:37 PM

## 2013-11-05 ENCOUNTER — Ambulatory Visit (HOSPITAL_COMMUNITY): Payer: BC Managed Care – PPO

## 2013-11-10 ENCOUNTER — Ambulatory Visit (HOSPITAL_COMMUNITY)
Admission: RE | Admit: 2013-11-10 | Discharge: 2013-11-10 | Disposition: A | Discharge status: Home / Self Care | Payer: BC Managed Care – PPO | Source: Ambulatory Visit | Attending: Family Medicine | Admitting: Family Medicine

## 2013-11-10 DIAGNOSIS — M6281 Muscle weakness (generalized): Secondary | ICD-10-CM | POA: Insufficient documentation

## 2013-11-10 DIAGNOSIS — IMO0001 Reserved for inherently not codable concepts without codable children: Secondary | ICD-10-CM | POA: Insufficient documentation

## 2013-11-10 DIAGNOSIS — R262 Difficulty in walking, not elsewhere classified: Secondary | ICD-10-CM | POA: Insufficient documentation

## 2013-11-10 DIAGNOSIS — M79609 Pain in unspecified limb: Secondary | ICD-10-CM | POA: Insufficient documentation

## 2013-11-10 NOTE — Progress Notes (Addendum)
Physical Therapy Treatment Patient Details  Name: [name] MRN: [id number] Date of Birth: 09/10/1950  Today's Date: 11/10/2013 Time: 2409-7353 PT Time Calculation (min): 40 min Visit#: 13 of 24  Re-eval: 11/21/13 Authorization: BCBS  Authorization Visit#: 13 of 24  Charges:  therex 2992-4268(34'), massage 1715-1725 (10')  Subjective: Symptoms/Limitations Symptoms: Pt states she's been having increased pain in her Lt central knee, pt states she is hoping it is not arthritis. Pain Assessment Currently in Pain?: Yes Pain Score: 4  Pain Location: Knee Pain Orientation: Left   Exercise/Treatments Stretches Active Hamstring Stretch: 10 seconds;4 reps Active Hamstring Stretch Limitations: 3way  14in box Hip Flexor Stretch: Limitations Hip Flexor Stretch Limitations: 2way groin 10x 3sec hold ITB Stretch: Limitations ITB Stretch Limitations: 4x10 sec hold Gastroc Stretch: Limitations;2 reps;20 seconds Gastroc Stretch Limitations: Wedge, 3 way Aerobic Stationary Bike: 6 mins 3.0 seat 8 Standing Other Standing Knee Exercises: squat reach matrix 5x each of 5 ways Other Standing Knee Exercises: 3D hip excursion 10 reps each   Manual Therapy Manual Therapy: Other (comment) Edema Management: retro massage ankle to mid quad anteriorly and posteriorly.    Physical Therapy Assessment and Plan PT Assessment and Plan Clinical Impression Statement: Patient concerned with increasing Lt knee pain since edema mobilized into knee (edema was disbursed), especially posterior knee.   Added recumbent bike in today without difficulty.  Pt with pain initially with squats, however reduced after repetitive motion.  PT returns to MD tomorrow regarding change in symptoms. PT Plan: Continue bike, LE stretches, and strengthening exercises with progressing exercises in intensity as appropriate per patient's strength and symptoms. Inquire regarding appointment with MD next visit.       Problem  List Patient Active Problem List   Diagnosis Date Noted  . Pain in joint, lower leg 08/30/2013  . Migraines 01/21/2013    PT - End of Session Activity Tolerance: Patient tolerated treatment well General Behavior During Therapy: [facility] for tasks assessed/performed   Teena Irani, PTA/CLT 11/10/2013, 5:54 PM

## 2013-11-12 ENCOUNTER — Ambulatory Visit (HOSPITAL_COMMUNITY)
Admission: RE | Admit: 2013-11-12 | Discharge: 2013-11-12 | Disposition: A | Discharge status: Home / Self Care | Payer: BC Managed Care – PPO | Source: Ambulatory Visit | Attending: Physical Therapy | Admitting: Physical Therapy

## 2013-11-12 NOTE — Evaluation (Addendum)
Physical Therapy Discharge  Patient Details  Name: [name] MRN: [id number] Date of Birth: 08-Jun-1950  Today's Date: 11/12/2013 Time: 3704-8889 PT Time Calculation (min): 55 min     Charges: TherEx [id number]         Visit#: 14 of   24 Re-eval:   Assessment Diagnosis: Difficulty walking secondary to limited knee mobilit and weakness following Lt Tibial plateau fracture Surgical Date: 07/03/13 Prior Therapy: no  Authorization: BCBS    Authorization Visit#: 48 of 24   Past Medical History:  Past Medical History  Diagnosis Date  . Allergy   . Hyperlipidemia   . Migraines     Chronic  . Sinusitis   . Acid reflux    Past Surgical History:  Past Surgical History  Procedure Laterality Date  . Tubal ligation    . Appendectomy    . Cholecystectomy    . Tonsillectomy and adenoidectomy    . Colonoscopy    . Breast enhancement surgery  1985    Subjective Symptoms/Limitations Symptoms: Patient states no pain and feels comfortable being discharged from therapy with home exercise program that she performs regularly.  Pain Assessment Currently in Pain?: No/denies  Sensation/Coordination/Flexibility/Functional Tests Functional Tests Functional Tests: FOTO: Currently 39% limited, 61% status, was 55% limitation, 45% functional status  Assessment LLE AROM (degrees) Left Hip Flexion: 120 Left Knee Extension: 0 Left Knee Flexion: 126 Left Ankle Dorsiflexion: 18 Left Ankle Plantar Flexion: 50 LLE Strength Left Hip Flexion:  (4+/5) Left Knee Flexion: 4/5 Left Knee Extension: 5/5 Left Ankle Dorsiflexion:  (4+/5)  Exercise/Treatments Sagittal, frontal, transverse plane squats (27 positions) 2x Single leg toe touch squat reach matrix 5x Lunge matrix common with knee high reach 5 x Single leg balance reach matrix common and uncommon with slider x5 Nu Step 85mnutes.   Physical Therapy Assessment and Plan PT Assessment and Plan Clinical Impression Statement: Patient  displays very good progress towards all goals this session and displays good understanig of exercise progression and independece with all exercises for home exercise program. Due to patient busy schedule and patient reaching most functional goals and being able to contineu her exercises independently at home patient is ready to discharge from phsycial therapy.. This session focused on educatign patient in exercise progression for return to running. PT Plan: Patient discharged from skilled phsycial therapy with home exercise plan    Goals PT Short Term Goals PT Short Term Goal 1: Patient will improve knee extension to 3 degrees from full extension to increased stride length during gait PT Short Term Goal 1 - Progress: Met PT Short Term Goal 2: Patient will increase knee flexion to 120 degrees so patient can squat to low chair to work in garden PT Short Term Goal 2 - Progress: Met PT Short Term Goal 3: Patient will have 4/5 knee flexion/strength so patient can ambulate up stairs without difficulty PT Short Term Goal 3 - Progress: Met PT Short Term Goal 4: Increase Lt hip abduction strength to 4+/5 to improve weight shift during gait PT Short Term Goal 4 - Progress: Progressing toward goal PT Long Term Goals PT Long Term Goal 1: Patient will improve knee extension to full ROM to increased stride length during gait PT Long Term Goal 1 - Progress: Met PT Long Term Goal 2: Patient will increase knee flexion to 125 degrees so patient can squat heel so she can work efficiently in her garden.  PT Long Term Goal 2 - Progress: Met Long Term Goal 3: Patient  will have 5/5 knee flexion/extension strength so she can return to running.  Long Term Goal 3 Progress: Partly met Long Term Goal 4: Patient will be able to perform single leg squat >90 degrees and >20 repetitions in 1 minute indicating patient is safe to begin return to sport training.  Long Term Goal 4 Progress: Not met PT Long Term Goal 5: Increase  Lt hip abduction strength to 5/5 to improve single leg stand and weight shifting durign running Long Term Goal 5 Progress: Not met  Problem List Patient Active Problem List   Diagnosis Date Noted  . Pain in joint, lower leg 08/30/2013  . Migraines 01/21/2013    PT - End of Session Activity Tolerance: Patient tolerated treatment well General Behavior During Therapy: [facility] for tasks assessed/performed  GP    Jericha Bryden R 11/12/2013, 5:53 PM  Physician Documentation Your signature is required to indicate approval of the treatment plan as stated above.  Please sign and either send electronically or make a copy of this report for your files and return this physician signed original.   Please mark one 1.__approve of plan  2. ___approve of plan with the following conditions.   ______________________________                                                          _____________________ Physician Signature                                                                                                             Date

## 2013-11-27 ENCOUNTER — Other Ambulatory Visit: Payer: Self-pay | Admitting: Family Medicine

## 2013-11-29 NOTE — Telephone Encounter (Signed)
1 refill needs followup visit

## 2013-12-27 ENCOUNTER — Other Ambulatory Visit: Payer: Self-pay | Admitting: Family Medicine

## 2013-12-27 DIAGNOSIS — Z139 Encounter for screening, unspecified: Secondary | ICD-10-CM

## 2014-01-03 ENCOUNTER — Ambulatory Visit (HOSPITAL_COMMUNITY)
Admission: RE | Admit: 2014-01-03 | Discharge: 2014-01-03 | Disposition: A | Discharge status: Home / Self Care | Payer: BC Managed Care – PPO | Source: Ambulatory Visit | Attending: Family Medicine | Admitting: Family Medicine

## 2014-01-03 DIAGNOSIS — Z139 Encounter for screening, unspecified: Secondary | ICD-10-CM

## 2014-01-03 DIAGNOSIS — Z1231 Encounter for screening mammogram for malignant neoplasm of breast: Secondary | ICD-10-CM | POA: Insufficient documentation

## 2014-01-19 ENCOUNTER — Encounter: Payer: Self-pay | Admitting: Nurse Practitioner

## 2014-01-19 ENCOUNTER — Ambulatory Visit (INDEPENDENT_AMBULATORY_CARE_PROVIDER_SITE_OTHER): Payer: BC Managed Care – PPO | Admitting: Nurse Practitioner

## 2014-01-19 VITALS — BP 116/74 | Ht 64.5 in | Wt 136.0 lb

## 2014-01-19 DIAGNOSIS — Z124 Encounter for screening for malignant neoplasm of cervix: Secondary | ICD-10-CM

## 2014-01-19 DIAGNOSIS — Z13820 Encounter for screening for osteoporosis: Secondary | ICD-10-CM

## 2014-01-19 DIAGNOSIS — R5381 Other malaise: Secondary | ICD-10-CM

## 2014-01-19 DIAGNOSIS — Z01419 Encounter for gynecological examination (general) (routine) without abnormal findings: Secondary | ICD-10-CM

## 2014-01-19 DIAGNOSIS — R5383 Other fatigue: Secondary | ICD-10-CM

## 2014-01-19 DIAGNOSIS — Z79899 Other long term (current) drug therapy: Secondary | ICD-10-CM

## 2014-01-19 DIAGNOSIS — Z1211 Encounter for screening for malignant neoplasm of colon: Secondary | ICD-10-CM

## 2014-01-19 DIAGNOSIS — Z0000 Encounter for general adult medical examination without abnormal findings: Secondary | ICD-10-CM

## 2014-01-19 LAB — LIPID PANEL
Cholesterol: 214 mg/dL — ABNORMAL HIGH (ref 0–200)
HDL: 91 mg/dL (ref >39)
LDL CALC: 110 mg/dL — AB (ref 0–99)
TRIGLYCERIDES: 67 mg/dL (ref <150)
Total CHOL/HDL Ratio: 2.4 Ratio
VLDL: 13 mg/dL (ref 0–40)

## 2014-01-19 LAB — BASIC METABOLIC PANEL
BUN: 17 mg/dL (ref 6–23)
CHLORIDE: 107 meq/L (ref 96–112)
CO2: 26 meq/L (ref 19–32)
Calcium: 9.5 mg/dL (ref 8.4–10.5)
Creat: 0.9 mg/dL (ref 0.50–1.10)
Glucose, Bld: 77 mg/dL (ref 70–99)
POTASSIUM: 4.6 meq/L (ref 3.5–5.3)
Sodium: 143 mEq/L (ref 135–145)

## 2014-01-19 LAB — CBC WITH DIFFERENTIAL/PLATELET
BASOS ABS: 0.1 10*3/uL (ref 0.0–0.1)
Basophils Relative: 1 % (ref 0–1)
EOS PCT: 7 % — AB (ref 0–5)
Eosinophils Absolute: 0.4 10*3/uL (ref 0.0–0.7)
HEMATOCRIT: 47.6 % — AB (ref 36.0–46.0)
HEMOGLOBIN: 15.9 g/dL — AB (ref 12.0–15.0)
Lymphocytes Relative: 37 % (ref 12–46)
Lymphs Abs: 2.1 10*3/uL (ref 0.7–4.0)
MCH: 31.3 pg (ref 26.0–34.0)
MCHC: 33.4 g/dL (ref 30.0–36.0)
MCV: 93.7 fL (ref 78.0–100.0)
MONO ABS: 0.4 10*3/uL (ref 0.1–1.0)
MONOS PCT: 7 % (ref 3–12)
NEUTROS ABS: 2.8 10*3/uL (ref 1.7–7.7)
Neutrophils Relative %: 48 % (ref 43–77)
Platelets: 221 10*3/uL (ref 150–400)
RBC: 5.08 MIL/uL (ref 3.87–5.11)
RDW: 14.1 % (ref 11.5–15.5)
WBC: 5.8 10*3/uL (ref 4.0–10.5)

## 2014-01-19 LAB — HEPATIC FUNCTION PANEL
ALK PHOS: 63 U/L (ref 39–117)
ALT: 25 U/L (ref 0–35)
AST: 24 U/L (ref 0–37)
Albumin: 4.1 g/dL (ref 3.5–5.2)
BILIRUBIN TOTAL: 0.5 mg/dL (ref 0.2–1.2)
Bilirubin, Direct: 0.1 mg/dL (ref 0.0–0.3)
Indirect Bilirubin: 0.4 mg/dL (ref 0.2–1.2)
TOTAL PROTEIN: 6.8 g/dL (ref 6.0–8.3)

## 2014-01-19 LAB — TSH: TSH: 0.895 u[IU]/mL (ref 0.350–4.500)

## 2014-01-20 ENCOUNTER — Encounter: Payer: Self-pay | Admitting: Nurse Practitioner

## 2014-01-20 LAB — PAP IG W/ RFLX HPV ASCU

## 2014-01-20 LAB — VITAMIN D 25 HYDROXY (VIT D DEFICIENCY, FRACTURES): VIT D 25 HYDROXY: 45 ng/mL (ref 30–89)

## 2014-01-20 NOTE — Progress Notes (Signed)
   Subjective:    Patient ID: [name], female    DOB: 04-26-1951, 63 y.o.   MRN: [id number]  HPI presents for her wellness physical. Overall healthy diet. Occasional reflux, well-controlled with Prevacid in the past although patient has stopped this because of something she read. Advised patient that we are unaware of any specific warnings from FDA at this time. Recommend that she restart her medication. Daily multivitamin. Very active, regular walking program. Married, same sexual partner. No vaginal bleeding. Regular vision and dental care. Sees a dermatologist for skin cancer screening. History of chronic constipation. Unsure when her next colonoscopy is due. Has had a tetanus shot but considering getting Tdap because of her great-grandchild.    Review of Systems  Constitutional: Negative for activity change, appetite change and fatigue.  HENT: Positive for congestion and rhinorrhea. Negative for dental problem, ear pain, sinus pressure and sore throat.   Respiratory: Negative for cough, chest tightness, shortness of breath and wheezing.   Cardiovascular: Negative for chest pain and leg swelling.  Gastrointestinal: Positive for constipation. Negative for nausea, vomiting, abdominal pain, diarrhea, blood in stool and abdominal distention.  Genitourinary: Negative for dysuria, urgency, frequency, vaginal bleeding, vaginal discharge, enuresis, difficulty urinating, genital sores and pelvic pain.       Objective:   Physical Exam  Vitals reviewed. Constitutional: She is oriented to person, place, and time. She appears well-developed. No distress.  HENT:  Right Ear: External ear normal.  Left Ear: External ear normal.  Mouth/Throat: Oropharynx is clear and moist.  Neck: Normal range of motion. Neck supple. No tracheal deviation present. No thyromegaly present.  Cardiovascular: Normal rate, regular rhythm and normal heart sounds.  Exam reveals no gallop.   No murmur  heard. Pulmonary/Chest: Effort normal and breath sounds normal.  Abdominal: Soft. She exhibits no distension. There is no tenderness.  Genitourinary: Vagina normal and uterus normal. No vaginal discharge found.  External GU: No lesions or rashes noted. Vagina pale and slightly dry, no discharge. No CMT. Bimanual exam no masses or tenderness, ovaries nonpalpable. Rectal exam no masses noted, no stool for Hemoccult.  Musculoskeletal: She exhibits no edema.  Lymphadenopathy:    She has no cervical adenopathy.  Neurological: She is alert and oriented to person, place, and time.  Skin: Skin is warm and dry. No rash noted.  Psychiatric: She has a normal mood and affect. Her behavior is normal.   Breast exam: Bilateral breast implants noted, no nodularity noted around the periphery, axilla no adenopathy.       Assessment & Plan:  Well woman exam - Plan: Pap IG w/ reflex to HPV when ASC-U, Lipid panel  Screening for cervical cancer - Plan: Pap IG w/ reflex to HPV when ASC-U  Other malaise and fatigue - Plan: CBC with Differential, Basic metabolic panel, TSH, Vit D  25 hydroxy (rtn osteoporosis monitoring)  Encounter for long-term (current) use of other medications - Plan: Hepatic function panel  Screening for osteoporosis - Plan: DG Bone Density  Given prescription for Zostavax. Will check on date for colonoscopy. If patient wishes to get Tdap, recommend local pharmacy. Continue calcium and vitamin D supplementation. Next physical in one year.

## 2014-01-25 ENCOUNTER — Ambulatory Visit (HOSPITAL_COMMUNITY)
Admission: RE | Admit: 2014-01-25 | Discharge: 2014-01-25 | Disposition: A | Discharge status: Home / Self Care | Payer: BC Managed Care – PPO | Source: Ambulatory Visit | Attending: Nurse Practitioner | Admitting: Nurse Practitioner

## 2014-01-25 DIAGNOSIS — Z13820 Encounter for screening for osteoporosis: Secondary | ICD-10-CM | POA: Insufficient documentation

## 2014-01-27 ENCOUNTER — Encounter: Payer: Self-pay | Admitting: Nurse Practitioner

## 2014-01-27 DIAGNOSIS — M810 Age-related osteoporosis without current pathological fracture: Secondary | ICD-10-CM | POA: Insufficient documentation

## 2014-01-28 ENCOUNTER — Other Ambulatory Visit: Payer: Self-pay | Admitting: Nurse Practitioner

## 2014-01-28 MED ORDER — ALENDRONATE SODIUM 70 MG PO TABS: 70.0000 mg | ORAL_TABLET | ORAL | Status: DC

## 2014-02-03 ENCOUNTER — Encounter: Payer: Self-pay | Admitting: General Practice

## 2014-02-05 ENCOUNTER — Other Ambulatory Visit: Payer: Self-pay | Admitting: Family Medicine

## 2014-02-08 NOTE — Telephone Encounter (Signed)
May have this +3 refills 

## 2014-02-20 ENCOUNTER — Other Ambulatory Visit: Payer: Self-pay | Admitting: Family Medicine

## 2014-02-21 NOTE — Telephone Encounter (Signed)
Refill there's +3 additional refills

## 2014-02-23 ENCOUNTER — Encounter: Payer: Self-pay | Admitting: Family Medicine

## 2014-02-23 ENCOUNTER — Ambulatory Visit (INDEPENDENT_AMBULATORY_CARE_PROVIDER_SITE_OTHER): Payer: BC Managed Care – PPO | Admitting: Family Medicine

## 2014-02-23 VITALS — BP 124/80 | Temp 98.2°F | Ht 64.5 in | Wt 140.0 lb

## 2014-02-23 DIAGNOSIS — J019 Acute sinusitis, unspecified: Secondary | ICD-10-CM

## 2014-02-23 MED ORDER — AMOXICILLIN-POT CLAVULANATE 875-125 MG PO TABS: 1.0000 | ORAL_TABLET | Freq: Two times a day (BID) | ORAL | Status: DC

## 2014-02-23 NOTE — Progress Notes (Signed)
   Subjective:    Patient ID: [name], female    DOB: 04/20/1951, 63 y.o.   MRN: [id number]  Sinusitis This is a new problem. The current episode started 1 to 4 weeks ago. The problem is unchanged. There has been no fever. The pain is moderate. Associated symptoms include congestion, coughing, headaches and sinus pressure. Pertinent negatives include no ear pain or shortness of breath. (Drainage) Treatments tried: sinus rinse. The treatment provided no relief.   Patient states that she has no other concerns at this time.  PMH reviewed  Review of Systems  Constitutional: Negative for fever and activity change.  HENT: Positive for congestion, rhinorrhea and sinus pressure. Negative for ear pain.   Eyes: Negative for discharge.  Respiratory: Positive for cough. Negative for shortness of breath and wheezing.   Cardiovascular: Negative for chest pain.  Neurological: Positive for headaches.       Objective:   Physical Exam  Nursing note and vitals reviewed. Constitutional: She appears well-developed.  HENT:  Head: Normocephalic.  Nose: Nose normal.  Mouth/Throat: Oropharynx is clear and moist. No oropharyngeal exudate.  Neck: Neck supple.  Cardiovascular: Normal rate and normal heart sounds.   No murmur heard. Pulmonary/Chest: Effort normal and breath sounds normal. She has no wheezes.  Lymphadenopathy:    She has no cervical adenopathy.  Skin: Skin is warm and dry.          Assessment & Plan:  Sinusitis antibiotics prescribed warning signs discussed followup of problems

## 2014-03-18 ENCOUNTER — Other Ambulatory Visit: Payer: Self-pay

## 2014-04-20 ENCOUNTER — Other Ambulatory Visit: Payer: Self-pay | Admitting: Family Medicine

## 2014-07-06 ENCOUNTER — Ambulatory Visit (INDEPENDENT_AMBULATORY_CARE_PROVIDER_SITE_OTHER): Payer: BLUE CROSS/BLUE SHIELD | Admitting: Nurse Practitioner

## 2014-07-06 ENCOUNTER — Encounter: Payer: Self-pay | Admitting: Nurse Practitioner

## 2014-07-06 VITALS — BP 112/74 | Ht 64.5 in | Wt 143.0 lb

## 2014-07-06 DIAGNOSIS — M810 Age-related osteoporosis without current pathological fracture: Secondary | ICD-10-CM

## 2014-07-06 DIAGNOSIS — F419 Anxiety disorder, unspecified: Secondary | ICD-10-CM

## 2014-07-06 NOTE — Patient Instructions (Signed)
wellbutrin SR 150 mg twice a day

## 2014-07-06 NOTE — Progress Notes (Signed)
Subjective:  Presents for routine follow-up. Has had a hard time taking Fosamax due to increase constipation and dry mouth. Has trouble taking calcium and vitamin D due to constipation. Stays very active. Has been married over 61 years to a husband who has alcohol abuse issues. He now has significant health issues. Also patient fell about 6 months ago while holding her great grandchild. The child was not injured but patient had several significant injuries. Has been having nightmares about falling. Finances are limited especially since her husband has been sick. Has a large deductible with her insurance.  Objective:   BP 112/74 mmHg  Ht 5' 4.5" (1.638 m)  Wt 143 lb (64.864 kg)  BMI 24.18 kg/m2 NAD. Alert, oriented. Lungs clear. Heart regular rate rhythm. Mildly anxious affect.  Assessment: Osteoporosis  Anxiety with element of mild PTSD  Plan: Hold on Fosamax for one month, see if any of her current symptoms improve. Recommend mental health counseling for her anxiety but patient wishes to hold on this due to finances. Recommend taking Klonopin at bedtime for sleep. Also renew insurance check to see if Wellbutrin will be less expensive on the 12 hour dose. Patient to call back and let us know which prescriptions to fill. Return in about 7 months (around 02/04/2015) for physical. Call back sooner if needed.

## 2014-07-13 ENCOUNTER — Other Ambulatory Visit: Payer: Self-pay | Admitting: Nurse Practitioner

## 2014-07-13 ENCOUNTER — Telehealth: Payer: Self-pay | Admitting: Nurse Practitioner

## 2014-07-13 NOTE — Telephone Encounter (Signed)
Left message to return call 

## 2014-07-13 NOTE — Telephone Encounter (Signed)
Patient was to call back today to let us know what pharmacy she was using.  She is using [facility].

## 2014-07-13 NOTE — Telephone Encounter (Signed)
Just to be sure is that on [address] in [place]? Also which meds does she want me to send in?

## 2014-07-15 MED ORDER — ALENDRONATE SODIUM 70 MG PO TABS: 70.0000 mg | ORAL_TABLET | ORAL | Status: DC

## 2014-07-15 MED ORDER — BUPROPION HCL ER (XL) 300 MG PO TB24: 300.0000 mg | ORAL_TABLET | Freq: Every day | ORAL | Status: DC

## 2014-07-15 MED ORDER — TOPIRAMATE 100 MG PO TABS: 100.0000 mg | ORAL_TABLET | Freq: Two times a day (BID) | ORAL | Status: DC

## 2014-07-15 NOTE — Telephone Encounter (Signed)
Pt will call back to give [place] more info on the pharmacy. She needs a refill on all her meds, except for the controlled meds. [facility] 07/13/14, 07/15/14

## 2014-07-15 NOTE — Telephone Encounter (Signed)
Refills sent to [facility] per pt request. Pt notified.

## 2014-07-15 NOTE — Telephone Encounter (Signed)
Left message to return call 

## 2014-07-21 ENCOUNTER — Telehealth: Payer: Self-pay | Admitting: Nurse Practitioner

## 2014-07-21 ENCOUNTER — Other Ambulatory Visit: Payer: Self-pay | Admitting: Nurse Practitioner

## 2014-07-21 MED ORDER — BUPROPION HCL ER (SR) 150 MG PO TB12: 150.0000 mg | ORAL_TABLET | Freq: Two times a day (BID) | ORAL | Status: DC

## 2014-07-21 NOTE — Telephone Encounter (Signed)
Done. Same total dose but BID dosing.

## 2014-07-21 NOTE — Telephone Encounter (Signed)
Patient notified

## 2014-07-21 NOTE — Telephone Encounter (Signed)
buPROPion (WELLBUTRIN XL) 300 MG 24 hr tablet This one needs to be changed to the twice a day generic, too expensive   [facility]

## 2014-08-17 ENCOUNTER — Telehealth: Payer: Self-pay | Admitting: Nurse Practitioner

## 2014-08-17 ENCOUNTER — Other Ambulatory Visit: Payer: Self-pay | Admitting: *Deleted

## 2014-08-17 MED ORDER — RIZATRIPTAN BENZOATE 10 MG PO TBDP: ORAL_TABLET | ORAL | Status: DC

## 2014-08-17 NOTE — Telephone Encounter (Signed)
rizatriptan (MAXALT-MLT) 10 MG disintegrating tablet  Pt needs to have this sent to [facility] switching pharmacies due to insurance change

## 2014-08-17 NOTE — Telephone Encounter (Signed)
Pt.notified

## 2014-08-18 ENCOUNTER — Telehealth: Payer: Self-pay | Admitting: Family Medicine

## 2014-08-18 ENCOUNTER — Other Ambulatory Visit: Payer: Self-pay | Admitting: *Deleted

## 2014-08-18 MED ORDER — RIZATRIPTAN BENZOATE 10 MG PO TBDP: ORAL_TABLET | ORAL | Status: DC

## 2014-08-18 NOTE — Telephone Encounter (Signed)
Spoke with pt, she does not use more than the insurance allowed amount of rizatriptan (MAXALT-MLT) 10 MG disintegrating tablet  If "ok" please send in new Rx for #18 for 30 days (this is the quantity that her insurance allows)  Please specify on Rx the quantity and days supply so the pharmacy will run it correctly.  (this will avoid needing a quantity limit override as pt does not use more than allowed)  [facility] and call pt when done

## 2014-08-18 NOTE — Telephone Encounter (Signed)
LMCR for pt, need to know # of Rizatriptan tablets used in 30 days on average to help determine if an actual Qty limit override needs to be processed

## 2014-08-18 NOTE — Telephone Encounter (Signed)
Patient called back today stating insurance will not cover her medication for migraine (maxalt 10 mg) is there something else close to it that her insurance will cover.

## 2014-08-18 NOTE — Telephone Encounter (Signed)
Nurses- review previous message, send in Rx for med, use bid prn migraines , follow Brendales advice for the other part please, 6 refills

## 2014-08-18 NOTE — Telephone Encounter (Signed)
Med sent to pharm. Pt notified.  

## 2014-09-28 ENCOUNTER — Other Ambulatory Visit: Payer: Self-pay | Admitting: Family Medicine

## 2014-09-29 NOTE — Telephone Encounter (Signed)
This +3 refills 

## 2014-10-17 ENCOUNTER — Other Ambulatory Visit: Payer: Self-pay | Admitting: Nurse Practitioner

## 2014-12-30 ENCOUNTER — Encounter: Payer: Self-pay | Admitting: Nurse Practitioner

## 2014-12-30 ENCOUNTER — Ambulatory Visit (INDEPENDENT_AMBULATORY_CARE_PROVIDER_SITE_OTHER): Payer: BLUE CROSS/BLUE SHIELD | Admitting: Nurse Practitioner

## 2014-12-30 VITALS — BP 124/70 | Temp 98.5°F | Ht 64.5 in | Wt 142.0 lb

## 2014-12-30 DIAGNOSIS — J310 Chronic rhinitis: Secondary | ICD-10-CM

## 2014-12-30 DIAGNOSIS — J329 Chronic sinusitis, unspecified: Secondary | ICD-10-CM | POA: Diagnosis not present

## 2014-12-30 MED ORDER — PREDNISONE 20 MG PO TABS: ORAL_TABLET | ORAL | Status: DC

## 2014-12-30 MED ORDER — METHYLPREDNISOLONE ACETATE 40 MG/ML IJ SUSP
40.0000 mg | Freq: Once | INTRAMUSCULAR | Status: AC
  Administered 2014-12-30: 40 mg via INTRAMUSCULAR

## 2014-12-30 MED ORDER — AMOXICILLIN-POT CLAVULANATE 875-125 MG PO TABS: 1.0000 | ORAL_TABLET | Freq: Two times a day (BID) | ORAL | Status: DC

## 2014-12-30 NOTE — Patient Instructions (Signed)
Zaditor eye drops as directed

## 2015-01-02 ENCOUNTER — Encounter: Payer: Self-pay | Admitting: Nurse Practitioner

## 2015-01-02 NOTE — Progress Notes (Signed)
Subjective:  Presents with complaints of sinus symptoms off and on for the past 2 months. Now having frontal area headache worse with bending. No fever. Ear pain. No sore throat. No cough or wheezing. Has tried Mucinex D, Sudafed and Flonase. Slight swelling around the eyes especially in the mornings. No difficulty speaking or swallowing. No numbness or weakness of the face arms or legs.  Objective:   BP 124/70 mmHg  Temp(Src) 98.5 F (36.9 C) (Oral)  Ht 5' 4.5" (1.638 m)  Wt 142 lb (64.411 kg)  BMI 24.01 kg/m2 NAD. Alert, oriented. TMs significant retraction, no erythema. Pharynx injected with PND noted. Neck supple with mild soft anterior adenopathy. Lungs clear. Heart regular rate rhythm.  Assessment: Rhinosinusitis - Plan: methylPREDNISolone acetate (DEPO-MEDROL) injection 40 mg  Plan:  Meds ordered this encounter  Medications  . predniSONE (DELTASONE) 20 MG tablet    Sig: 3 po qd x 3 d then 2 po qd x 3 d then 1 po qd x 3 d    Dispense:  18 tablet    Refill:  0    Order Specific Question:  Supervising Provider    Answer:  Mikey Kirschner [[id number]]  . amoxicillin-clavulanate (AUGMENTIN) 875-125 MG per tablet    Sig: Take 1 tablet by mouth 2 (two) times daily.    Dispense:  20 tablet    Refill:  0    Order Specific Question:  Supervising Provider    Answer:  Mikey Kirschner [[id number]]  . methylPREDNISolone acetate (DEPO-MEDROL) injection 40 mg    Sig:    OTC meds as directed for congestion. Callback in 7-10 days if no improvement, sooner if worse.

## 2015-01-09 ENCOUNTER — Other Ambulatory Visit: Payer: Self-pay | Admitting: Nurse Practitioner

## 2015-02-01 ENCOUNTER — Other Ambulatory Visit: Payer: Self-pay | Admitting: Nurse Practitioner

## 2015-02-01 ENCOUNTER — Telehealth: Payer: Self-pay | Admitting: Family Medicine

## 2015-02-01 MED ORDER — LEVOFLOXACIN 500 MG PO TABS: 500.0000 mg | ORAL_TABLET | Freq: Every day | ORAL | Status: DC

## 2015-02-01 NOTE — Telephone Encounter (Signed)
Patient notified

## 2015-02-01 NOTE — Telephone Encounter (Signed)
Patient stated the eye doctor felt it was all related to allergy and sinuses and didn't do anything. Patient having no fever, no chest congestion-just sinus pressure, h/a blowing blood out of right nostril, eye lids puffy and face sore to touch

## 2015-02-01 NOTE — Telephone Encounter (Signed)
Levaquin sent in. Also recommend anti histamine and steroid nasal spray. Call back if no improvement.

## 2015-02-01 NOTE — Telephone Encounter (Signed)
Pt called stating that she was seen by Hoyle Sauer three weeks ago for a sinus infection and would like to speak to a nurse regarding this.

## 2015-02-01 NOTE — Telephone Encounter (Signed)
What did eye specialist tell her about her eyes? At this time, any fever? Color to her drainage? Sinus headache? Ear pain? Sore throat?

## 2015-02-01 NOTE — Telephone Encounter (Signed)
Spoke with patient and patient informed me that she was prescribed Augmentin and prednisone on 12/30/14 when see came in for sinus infection. Patient states that she was informed to take Augmentin first and if symptoms continued to take Prednisone so patient completed both medications as well as used Flonase. She says that she felt better for about two weeks and now she has c/o sinus pain, swollen eye lids, and blowing out blood when blowing nose. Patient states that has recently seen specialist for eye swelling and they had improved and are now worsening. Patient would like to know if she needs to start on another round of antibiotics?

## 2015-02-12 ENCOUNTER — Other Ambulatory Visit: Payer: Self-pay | Admitting: Family Medicine

## 2015-03-09 ENCOUNTER — Other Ambulatory Visit: Payer: Self-pay | Admitting: Family Medicine

## 2015-03-10 NOTE — Telephone Encounter (Signed)
May have this and 2 refills 

## 2015-03-29 ENCOUNTER — Other Ambulatory Visit: Payer: Self-pay | Admitting: *Deleted

## 2015-03-29 MED ORDER — BUPROPION HCL ER (SR) 150 MG PO TB12: ORAL_TABLET | ORAL | Status: DC

## 2015-04-07 ENCOUNTER — Encounter: Payer: Self-pay | Admitting: Nurse Practitioner

## 2015-04-07 ENCOUNTER — Ambulatory Visit (INDEPENDENT_AMBULATORY_CARE_PROVIDER_SITE_OTHER): Payer: BLUE CROSS/BLUE SHIELD | Admitting: Nurse Practitioner

## 2015-04-07 VITALS — BP 118/70 | Ht 64.5 in | Wt 147.4 lb

## 2015-04-07 DIAGNOSIS — R5383 Other fatigue: Secondary | ICD-10-CM

## 2015-04-07 DIAGNOSIS — F418 Other specified anxiety disorders: Secondary | ICD-10-CM | POA: Diagnosis not present

## 2015-04-07 DIAGNOSIS — G4700 Insomnia, unspecified: Secondary | ICD-10-CM

## 2015-04-07 MED ORDER — ZOLPIDEM TARTRATE 10 MG PO TABS: 10.0000 mg | ORAL_TABLET | Freq: Every evening | ORAL | Status: DC | PRN

## 2015-04-07 NOTE — Progress Notes (Signed)
   Subjective:    Patient ID: [name], female    DOB: May 06, 1951, 64 y.o.   MRN: [id number]  HPI MMSE completed and passed with a score of 30. Patient presents for complaints of fatigue insomnia weight gain difficulty focusing and short-term memory loss worse over the past 3 months. Has been in a 85 year marriage with her husband who is a functional alcoholic, has been getting worse. Describes her stress level as "very high". Concerned about forgetfulness. Goes to bed around 10 AM at night, wakes up multiple times during the night. In addition her husband works second shift, sometimes he will be out part of the night drinking and come home at different times.   Review of Systems No chest pain/ischemic type pain or shortness of breath.   denies symptoms of sleep apnea. Objective:   Physical Exam NAD. Alert, oriented. Mildly anxious affect. Tearing up during office visit. Lungs clear. Heart regular rate rhythm. Thyroid no mass or goiter, nontender to palpation.       Assessment & Plan:   Problem List Items Addressed This Visit      Other   Depression with anxiety   Relevant Orders   CBC with Differential/Platelet (Completed)   Hepatic function panel (Completed)   Basic metabolic panel (Completed)   TSH (Completed)   Hemoglobin A1c (Completed)   Insomnia - Primary   Relevant Orders   CBC with Differential/Platelet (Completed)   Hepatic function panel (Completed)   Basic metabolic panel (Completed)   TSH (Completed)   Hemoglobin A1c (Completed)    Other Visit Diagnoses    Other fatigue        Relevant Orders    CBC with Differential/Platelet (Completed)    Hepatic function panel (Completed)    Basic metabolic panel (Completed)    TSH (Completed)    Hemoglobin A1c (Completed)      Explained that short-term memory loss or difficulty focusing is most likely part of her extreme anxiety symptoms and lack of sleep. We agree that she should continue her current  medications and focus on trying to get good quality sleep. Start with Ambien as directed, cautioned about potential adverse effects. DC med and call if any problems. Strongly encourage patient to consider mental health counseling due to the complexity of the issues that she is dealing with. States that she will think about it.  Plan:  Meds ordered this encounter  Medications  . zolpidem (AMBIEN) 10 MG tablet    Sig: Take 1 tablet (10 mg total) by mouth at bedtime as needed for sleep.    Dispense:  30 tablet    Refill:  5    Order Specific Question:  Supervising Provider    Answer:  Mikey Kirschner [[id number]]   Return in about 3 months (around 07/08/2015) for recheck. Call back sooner if needed.

## 2015-04-08 LAB — CBC WITH DIFFERENTIAL/PLATELET
Basophils Absolute: 0 10*3/uL (ref 0.0–0.2)
Basos: 1 %
EOS (ABSOLUTE): 0.3 10*3/uL (ref 0.0–0.4)
Eos: 5 %
HEMATOCRIT: 41.5 % (ref 34.0–46.6)
HEMOGLOBIN: 13.7 g/dL (ref 11.1–15.9)
Immature Grans (Abs): 0 10*3/uL (ref 0.0–0.1)
Immature Granulocytes: 0 %
LYMPHS ABS: 2.8 10*3/uL (ref 0.7–3.1)
Lymphs: 45 %
MCH: 31.6 pg (ref 26.6–33.0)
MCHC: 33 g/dL (ref 31.5–35.7)
MCV: 96 fL (ref 79–97)
Monocytes Absolute: 0.5 10*3/uL (ref 0.1–0.9)
Monocytes: 8 %
NEUTROS ABS: 2.5 10*3/uL (ref 1.4–7.0)
NEUTROS PCT: 41 %
Platelets: 242 10*3/uL (ref 150–379)
RBC: 4.34 x10E6/uL (ref 3.77–5.28)
RDW: 13.7 % (ref 12.3–15.4)
WBC: 6.1 10*3/uL (ref 3.4–10.8)

## 2015-04-08 LAB — BASIC METABOLIC PANEL
BUN / CREAT RATIO: 16 (ref 11–26)
BUN: 14 mg/dL (ref 8–27)
CO2: 23 mmol/L (ref 18–29)
Calcium: 9 mg/dL (ref 8.7–10.3)
Chloride: 105 mmol/L (ref 97–106)
Creatinine, Ser: 0.87 mg/dL (ref 0.57–1.00)
GFR calc Af Amer: 83 mL/min/{1.73_m2} (ref >59)
GFR, EST NON AFRICAN AMERICAN: 72 mL/min/{1.73_m2} (ref >59)
GLUCOSE: 79 mg/dL (ref 65–99)
POTASSIUM: 4 mmol/L (ref 3.5–5.2)
SODIUM: 142 mmol/L (ref 136–144)

## 2015-04-08 LAB — HEPATIC FUNCTION PANEL
ALK PHOS: 49 IU/L (ref 39–117)
ALT: 13 IU/L (ref 0–32)
AST: 19 IU/L (ref 0–40)
Albumin: 4.1 g/dL (ref 3.6–4.8)
Bilirubin Total: 0.2 mg/dL (ref 0.0–1.2)
Bilirubin, Direct: 0.05 mg/dL (ref 0.00–0.40)
Total Protein: 6.1 g/dL (ref 6.0–8.5)

## 2015-04-08 LAB — HEMOGLOBIN A1C
Est. average glucose Bld gHb Est-mCnc: 114 mg/dL
Hgb A1c MFr Bld: 5.6 % (ref 4.8–5.6)

## 2015-04-08 LAB — TSH: TSH: 1.35 u[IU]/mL (ref 0.450–4.500)

## 2015-04-10 ENCOUNTER — Encounter: Payer: Self-pay | Admitting: Nurse Practitioner

## 2015-04-10 DIAGNOSIS — G4700 Insomnia, unspecified: Secondary | ICD-10-CM | POA: Insufficient documentation

## 2015-04-10 DIAGNOSIS — F418 Other specified anxiety disorders: Secondary | ICD-10-CM | POA: Insufficient documentation

## 2015-06-14 ENCOUNTER — Encounter: Payer: Self-pay | Admitting: Family Medicine

## 2015-06-14 ENCOUNTER — Ambulatory Visit (INDEPENDENT_AMBULATORY_CARE_PROVIDER_SITE_OTHER): Payer: BLUE CROSS/BLUE SHIELD | Admitting: Family Medicine

## 2015-06-14 VITALS — Temp 98.6°F | Ht 64.5 in | Wt 147.0 lb

## 2015-06-14 DIAGNOSIS — J019 Acute sinusitis, unspecified: Secondary | ICD-10-CM

## 2015-06-14 DIAGNOSIS — J209 Acute bronchitis, unspecified: Secondary | ICD-10-CM

## 2015-06-14 DIAGNOSIS — R6889 Other general symptoms and signs: Secondary | ICD-10-CM

## 2015-06-14 DIAGNOSIS — B9689 Other specified bacterial agents as the cause of diseases classified elsewhere: Secondary | ICD-10-CM

## 2015-06-14 MED ORDER — CLARITHROMYCIN 500 MG PO TABS: 500.0000 mg | ORAL_TABLET | Freq: Two times a day (BID) | ORAL | Status: DC

## 2015-06-14 NOTE — Progress Notes (Signed)
   Subjective:    Patient ID: [name], female    DOB: Apr 05, 1951, 65 y.o.   MRN: [id number]  Cough This is a new problem. The current episode started in the past 7 days. Associated symptoms include chills, ear pain, a fever, headaches, myalgias, nasal congestion, rhinorrhea and a sore throat. Pertinent negatives include no chest pain, shortness of breath or wheezing. Associated symptoms comments: Stomach virus new years eve. Treatments tried: theraflu and alka seltzer cold.   Patient denies smoking denies wheezing shortness of breath tried over-the-counter measures without success Flulike illness over the past few days as well  Review of Systems  Constitutional: Positive for fever and chills. Negative for activity change.  HENT: Positive for congestion, ear pain, rhinorrhea and sore throat.   Eyes: Negative for discharge.  Respiratory: Positive for cough. Negative for shortness of breath and wheezing.   Cardiovascular: Negative for chest pain.  Musculoskeletal: Positive for myalgias.  Neurological: Positive for headaches.       Objective:   Physical Exam  Constitutional: She appears well-developed.  HENT:  Head: Normocephalic.  Nose: Nose normal.  Mouth/Throat: Oropharynx is clear and moist. No oropharyngeal exudate.  Neck: Neck supple.  Cardiovascular: Normal rate and normal heart sounds.   No murmur heard. Pulmonary/Chest: Effort normal and breath sounds normal. She has no wheezes.  Lymphadenopathy:    She has no cervical adenopathy.  Skin: Skin is warm and dry.  Nursing note and vitals reviewed.         Assessment & Plan:  Patient was seen today for upper respiratory illness. It is felt that the patient is dealing with sinusitis. Antibiotics were prescribed today. Importance of compliance with medication was discussed. Symptoms should gradually resolve over the course of the next several days. If high fevers, progressive illness, difficulty breathing, worsening  condition or failure for symptoms to improve over the next several days then the patient is to follow-up. If any emergent conditions the patient is to follow-up in the emergency department otherwise to follow-up in the office.

## 2015-06-19 ENCOUNTER — Ambulatory Visit (INDEPENDENT_AMBULATORY_CARE_PROVIDER_SITE_OTHER): Payer: BLUE CROSS/BLUE SHIELD | Admitting: Family Medicine

## 2015-06-19 ENCOUNTER — Encounter: Payer: Self-pay | Admitting: Family Medicine

## 2015-06-19 VITALS — BP 130/76 | Temp 97.9°F | Ht 64.5 in | Wt 152.2 lb

## 2015-06-19 DIAGNOSIS — T783XXA Angioneurotic edema, initial encounter: Secondary | ICD-10-CM

## 2015-06-19 MED ORDER — PREDNISONE 20 MG PO TABS
ORAL_TABLET | ORAL | Status: DC
Start: 2015-06-19 — End: 2015-07-12

## 2015-06-19 MED ORDER — METHYLPREDNISOLONE ACETATE 40 MG/ML IJ SUSP
40.0000 mg | Freq: Once | INTRAMUSCULAR | Status: AC
  Administered 2015-06-19: 40 mg via INTRAMUSCULAR

## 2015-06-19 NOTE — Progress Notes (Signed)
   Subjective:    Patient ID: [name], female    DOB: Apr 15, 1951, 65 y.o.   MRN: [id number]  Allergic Reaction This is a new problem. The current episode started today. The patient was exposed to food. Associated symptoms include a rash. (Facial swelling) Swelling is present on the face. Treatments tried: Benadryl.   patient relates a history of intermittent angioedema but has not had this in years. Recently placed on an antibiotic Noticed a rash under her right eye left jaw region some swelling slight itching denies fever chills  Patient states no other concerns this visit.  Review of Systems  Skin: Positive for rash.   denies fever chills sweats nausea vomiting diarrhea     Objective:   Physical Exam   sinus nontender Angioedema underneath the right eye and left jawline Throat normal neck no masses lungs clear no respiratory distress      Assessment & Plan:   angioedema-could well be due to Biaxin. Stop Biaxin. Will avoid this in the future. Depo-Medrol shot along with prednisone taper and also cetirizine daily. Benadryl as needed. Patient will let us know if ongoing troubles. May need referral to allergist if frequent troubles. Warning signs regarding severe allergic reactions were discussed

## 2015-06-27 ENCOUNTER — Telehealth: Payer: Self-pay | Admitting: Family Medicine

## 2015-06-27 ENCOUNTER — Other Ambulatory Visit: Payer: Self-pay | Admitting: *Deleted

## 2015-06-27 MED ORDER — AMOXICILLIN-POT CLAVULANATE 875-125 MG PO TABS: 1.0000 | ORAL_TABLET | Freq: Two times a day (BID) | ORAL | Status: DC

## 2015-06-27 NOTE — Telephone Encounter (Signed)
Pt seen 1/11 issued antibiotics, allergic reaction to one then issued another She is now having headaches, sinus congestion, bad sore throat and ears hurt  Can you call her in another round or does she need to be see?   [facility]

## 2015-06-27 NOTE — Telephone Encounter (Signed)
Discussed with pt. Med sent to pharm.  

## 2015-06-27 NOTE — Telephone Encounter (Signed)
Seen 1/11 for URI. Prescribed biaxin. Seen again on 1/16 for allergic reaction to biaxin. No other antibiotic prescribed bc she was feeling better and everything was clear on recheck. On Friday pt started having ear pain, sore throat, sinus congestion, dry cough, some sob when coughing, no wheezing. Fever last night. Can another antibiotic be called in.

## 2015-06-27 NOTE — Telephone Encounter (Signed)
Augmentin 875 mg one twice a day for 10 days. Take medication with the snack and a glass of liquids, if any problems or ongoing problems follow-up

## 2015-07-12 ENCOUNTER — Ambulatory Visit (INDEPENDENT_AMBULATORY_CARE_PROVIDER_SITE_OTHER): Payer: BLUE CROSS/BLUE SHIELD | Admitting: Nurse Practitioner

## 2015-07-12 VITALS — BP 124/72 | Temp 98.9°F | Ht 64.5 in | Wt 155.0 lb

## 2015-07-12 DIAGNOSIS — L509 Urticaria, unspecified: Secondary | ICD-10-CM

## 2015-07-12 DIAGNOSIS — G4700 Insomnia, unspecified: Secondary | ICD-10-CM

## 2015-07-12 DIAGNOSIS — J329 Chronic sinusitis, unspecified: Secondary | ICD-10-CM

## 2015-07-12 DIAGNOSIS — K5900 Constipation, unspecified: Secondary | ICD-10-CM | POA: Diagnosis not present

## 2015-07-12 DIAGNOSIS — R5383 Other fatigue: Secondary | ICD-10-CM

## 2015-07-12 DIAGNOSIS — J310 Chronic rhinitis: Secondary | ICD-10-CM

## 2015-07-12 MED ORDER — CLONAZEPAM 0.5 MG PO TABS: 0.5000 mg | ORAL_TABLET | Freq: Two times a day (BID) | ORAL | Status: DC | PRN

## 2015-07-12 MED ORDER — AMOXICILLIN-POT CLAVULANATE 875-125 MG PO TABS: 1.0000 | ORAL_TABLET | Freq: Two times a day (BID) | ORAL | Status: DC

## 2015-07-12 NOTE — Patient Instructions (Addendum)
nasacort AQ as directed Antihistamine Allegra in the am; benadryl at night Zantac once daily  Magnesium citrate Fleets enema Dulcolax

## 2015-07-14 ENCOUNTER — Encounter: Payer: Self-pay | Admitting: Nurse Practitioner

## 2015-07-14 DIAGNOSIS — L509 Urticaria, unspecified: Secondary | ICD-10-CM | POA: Insufficient documentation

## 2015-07-14 DIAGNOSIS — K5900 Constipation, unspecified: Secondary | ICD-10-CM | POA: Insufficient documentation

## 2015-07-14 DIAGNOSIS — K5909 Other constipation: Secondary | ICD-10-CM | POA: Insufficient documentation

## 2015-07-14 NOTE — Progress Notes (Signed)
Subjective:   Presents for complaints of a recurrence of her sinus congestion. Also had a recurrence of the hives on her face and neck for the past couple of days which have improved. Was originally on Biaxin, had to stop this after 4 days due to hives. Was on Augmentin, doing better. Was off antibiotics for one day and got sick again. No fever. Off-and-on sore throat. Facial area pressure and fullness. Clear runny nose. Occasional nonproductive cough. No wheezing. Again patient states she is extremely fatigued see previous note. Continues to have significant stress at home, living with her husband who is an alcoholic. Also in the past patient has managed her chronic constipation with OTC fiber supplements but that is not working as well at this time.  Has not had a BM for at least 3 days. Difficulty sleeping.  Although Ambien worked well, had to stop this due to side effects such as sleepwalking. Had a colonoscopy in 2009.  Objective:   BP 124/72 mmHg  Temp(Src) 98.9 F (37.2 C) (Oral)  Ht 5' 4.5" (1.638 m)  Wt 155 lb (70.308 kg)  BMI 26.20 kg/m2  NAD. Alert, oriented. Fatigued in appearance. TMs clear effusion, no erythema. Posterior pharynx moderately erythematous with green PND noted. Neck supple with mild soft anterior cervical adenopathy. Lungs clear. Heart regular rate rhythm.  Abdomen soft nondistended nontender without obvious masses.  ASSESSMENT:  Problem List Items Addressed This Visit      Digestive   Constipation     Musculoskeletal and Integument   Urticaria     Other   Insomnia    Other Visit Diagnoses    Rhinosinusitis    -  Primary    Relevant Medications    amoxicillin-clavulanate (AUGMENTIN) 875-125 MG tablet    Other fatigue           PLAN:  Meds ordered this encounter  Medications  . amoxicillin-clavulanate (AUGMENTIN) 875-125 MG tablet    Sig: Take 1 tablet by mouth 2 (two) times daily.    Dispense:  20 tablet    Refill:  0    Order Specific Question:   Supervising Provider    Answer:  Mikey Kirschner [[id number]]  . clonazePAM (KLONOPIN) 0.5 MG tablet    Sig: Take 1 tablet (0.5 mg total) by mouth 2 (two) times daily as needed for anxiety.    Dispense:  40 tablet    Refill:  0    Order Specific Question:  Supervising Provider    Answer:  Mikey Kirschner [[id number]]   nasacort AQ as directed Antihistamine Allegra in the am; benadryl at night Zantac once daily  Magnesium citrate or Fleets enema or Dulcolax   lengthy discussion regarding the importance of stress reduction. Strongly recommend that patient join a local support group for family and friends of people with alcoholism. Patient agrees this will be a good idea. Explained that her hives as well as constipation could be worsened by her extreme stress. Trial of Klonopin for sleep which has worked well in the past. Call back if symptoms worsen or persist.

## 2015-07-30 ENCOUNTER — Encounter: Payer: Self-pay | Admitting: Nurse Practitioner

## 2015-08-18 ENCOUNTER — Other Ambulatory Visit: Payer: Self-pay | Admitting: Nurse Practitioner

## 2015-09-01 ENCOUNTER — Other Ambulatory Visit: Payer: Self-pay | Admitting: Family Medicine

## 2015-09-02 DIAGNOSIS — I442 Atrioventricular block, complete: Secondary | ICD-10-CM

## 2015-09-02 HISTORY — DX: Atrioventricular block, complete: I44.2

## 2015-09-18 ENCOUNTER — Ambulatory Visit (INDEPENDENT_AMBULATORY_CARE_PROVIDER_SITE_OTHER): Payer: BLUE CROSS/BLUE SHIELD | Admitting: Family Medicine

## 2015-09-18 ENCOUNTER — Encounter: Payer: Self-pay | Admitting: Family Medicine

## 2015-09-18 ENCOUNTER — Encounter (HOSPITAL_COMMUNITY): Payer: Self-pay | Admitting: *Deleted

## 2015-09-18 ENCOUNTER — Emergency Department (HOSPITAL_COMMUNITY): Payer: BLUE CROSS/BLUE SHIELD

## 2015-09-18 ENCOUNTER — Inpatient Hospital Stay (HOSPITAL_COMMUNITY)
Admission: EM | Admit: 2015-09-18 | Discharge: 2015-09-20 | DRG: 244 | Disposition: A | Discharge status: Home / Self Care | Payer: BLUE CROSS/BLUE SHIELD | Attending: Internal Medicine | Admitting: Internal Medicine

## 2015-09-18 VITALS — BP 118/82 | Temp 97.8°F | Ht 64.5 in | Wt 150.6 lb

## 2015-09-18 DIAGNOSIS — J019 Acute sinusitis, unspecified: Secondary | ICD-10-CM | POA: Diagnosis not present

## 2015-09-18 DIAGNOSIS — R001 Bradycardia, unspecified: Secondary | ICD-10-CM | POA: Diagnosis not present

## 2015-09-18 DIAGNOSIS — Z888 Allergy status to other drugs, medicaments and biological substances status: Secondary | ICD-10-CM

## 2015-09-18 DIAGNOSIS — Z87891 Personal history of nicotine dependence: Secondary | ICD-10-CM | POA: Diagnosis not present

## 2015-09-18 DIAGNOSIS — E785 Hyperlipidemia, unspecified: Secondary | ICD-10-CM | POA: Diagnosis present

## 2015-09-18 DIAGNOSIS — Z95818 Presence of other cardiac implants and grafts: Secondary | ICD-10-CM

## 2015-09-18 DIAGNOSIS — I442 Atrioventricular block, complete: Principal | ICD-10-CM | POA: Diagnosis present

## 2015-09-18 DIAGNOSIS — R55 Syncope and collapse: Secondary | ICD-10-CM | POA: Diagnosis not present

## 2015-09-18 DIAGNOSIS — J301 Allergic rhinitis due to pollen: Secondary | ICD-10-CM | POA: Diagnosis not present

## 2015-09-18 DIAGNOSIS — Z881 Allergy status to other antibiotic agents status: Secondary | ICD-10-CM

## 2015-09-18 DIAGNOSIS — B9689 Other specified bacterial agents as the cause of diseases classified elsewhere: Secondary | ICD-10-CM

## 2015-09-18 HISTORY — DX: Presence of cardiac pacemaker: Z95.0

## 2015-09-18 HISTORY — DX: Atrioventricular block, complete: I44.2

## 2015-09-18 LAB — CBC
HEMATOCRIT: 40.2 % (ref 36.0–46.0)
HEMOGLOBIN: 12.9 g/dL (ref 12.0–15.0)
MCH: 30.6 pg (ref 26.0–34.0)
MCHC: 32.1 g/dL (ref 30.0–36.0)
MCV: 95.3 fL (ref 78.0–100.0)
Platelets: 174 10*3/uL (ref 150–400)
RBC: 4.22 MIL/uL (ref 3.87–5.11)
RDW: 15.3 % (ref 11.5–15.5)
WBC: 5.8 10*3/uL (ref 4.0–10.5)

## 2015-09-18 LAB — BASIC METABOLIC PANEL
Anion gap: 9 (ref 5–15)
BUN: 14 mg/dL (ref 6–20)
CHLORIDE: 110 mmol/L (ref 101–111)
CO2: 24 mmol/L (ref 22–32)
CREATININE: 0.95 mg/dL (ref 0.44–1.00)
Calcium: 8.7 mg/dL — ABNORMAL LOW (ref 8.9–10.3)
GFR calc Af Amer: >60 mL/min (ref >60)
GFR calc non Af Amer: >60 mL/min (ref >60)
GLUCOSE: 84 mg/dL (ref 65–99)
Potassium: 5.2 mmol/L — ABNORMAL HIGH (ref 3.5–5.1)
SODIUM: 143 mmol/L (ref 135–145)

## 2015-09-18 LAB — URINALYSIS, ROUTINE W REFLEX MICROSCOPIC
BILIRUBIN URINE: NEGATIVE
GLUCOSE, UA: NEGATIVE mg/dL
HGB URINE DIPSTICK: NEGATIVE
Ketones, ur: NEGATIVE mg/dL
Leukocytes, UA: NEGATIVE
Nitrite: NEGATIVE
Protein, ur: NEGATIVE mg/dL
SPECIFIC GRAVITY, URINE: 1.007 (ref 1.005–1.030)
pH: 7 (ref 5.0–8.0)

## 2015-09-18 LAB — CREATININE, SERUM
Creatinine, Ser: 1.01 mg/dL — ABNORMAL HIGH (ref 0.44–1.00)
GFR calc Af Amer: >60 mL/min (ref >60)
GFR, EST NON AFRICAN AMERICAN: 58 mL/min — AB (ref >60)

## 2015-09-18 LAB — CBG MONITORING, ED: GLUCOSE-CAPILLARY: 78 mg/dL (ref 65–99)

## 2015-09-18 LAB — I-STAT TROPONIN, ED: Troponin i, poc: 0.01 ng/mL (ref 0.00–0.08)

## 2015-09-18 LAB — BRAIN NATRIURETIC PEPTIDE: B Natriuretic Peptide: 250.6 pg/mL — ABNORMAL HIGH (ref 0.0–100.0)

## 2015-09-18 MED ORDER — CEFPROZIL 500 MG PO TABS: 500.0000 mg | ORAL_TABLET | Freq: Two times a day (BID) | ORAL | Status: DC

## 2015-09-18 MED ORDER — SODIUM CHLORIDE 0.9 % IR SOLN
80.0000 mg | Status: AC
  Administered 2015-09-19: 80 mg
  Filled 2015-09-18: qty 2

## 2015-09-18 MED ORDER — CEFAZOLIN SODIUM-DEXTROSE 2-4 GM/100ML-% IV SOLN
2.0000 g | INTRAVENOUS | Status: DC
  Filled 2015-09-18: qty 100

## 2015-09-18 MED ORDER — ENOXAPARIN SODIUM 30 MG/0.3ML ~~LOC~~ SOLN: 30.0000 mg | SUBCUTANEOUS | Status: DC

## 2015-09-18 MED ORDER — SODIUM CHLORIDE 0.9 % IV SOLN
INTRAVENOUS | Status: DC
  Administered 2015-09-19: 06:00:00 via INTRAVENOUS

## 2015-09-18 NOTE — Progress Notes (Addendum)
   Subjective:    Patient ID: [name], female    DOB: 20-Dec-1950, 65 y.o.   MRN: [id number]  Cough This is a chronic problem. The current episode started in the past 7 days. The problem has been gradually worsening. The problem occurs hourly. The cough is productive of purulent sputum. Associated symptoms include chills, a fever, nasal congestion, a sore throat and wheezing. Associated symptoms comments: Passed out yest. Treatments tried: nasacort,benadryl,allegra, otc cold med.  Loss of Consciousness This is a new problem. The current episode started yesterday. The problem occurs 2 to 4 times per day. The problem has been resolved. She lost consciousness for a period of less than 1 minute. The symptoms are aggravated by standing. Associated symptoms include dizziness and a fever. Pertinent negatives include no bladder incontinence, bowel incontinence, confusion, diaphoresis, nausea, palpitations, slurred speech, visual change, vomiting or weakness. She has tried drinking for the symptoms. The treatment provided no relief. There is no history of arrhythmia, CAD, DM, HTN or seizures.   PMH negative for any syncope not on any bradycardic medications   Review of Systems  Constitutional: Positive for fever and chills. Negative for diaphoresis.  HENT: Positive for sore throat.   Respiratory: Positive for cough and wheezing.   Cardiovascular: Positive for syncope. Negative for palpitations.  Gastrointestinal: Negative for nausea, vomiting and bowel incontinence.  Genitourinary: Negative for bladder incontinence.  Neurological: Positive for dizziness. Negative for weakness.  Psychiatric/Behavioral: Negative for confusion.       Objective:   Physical Exam Pulse rate is slow bradycardic Patient not cyanotic not in any distress able to converse freely Lungs are clear no crackles cough noted heart slow but regular Extremities no edema skin warm dry Orthostatics checked laying sitting  standing no appreciable drop EKG shows significant bradycardia with possible severe first-degree AV block with drop of ventricular response versus heart block 2-1       Assessment & Plan:  Viral URI has history of COPD probable rhinosinusitis antibiotic prescribed warning signs discuss  Syncope-2 episodes of syncope yesterday when the patient was standing I believe these were vasovagal triggered by her severe bradycardia. Patient does not appear toxic currently not having symptomatology but I feel that this issue could become urgent in a hurry I will discuss this EKG with cardiology to see if they feel that the patient should go ahead and go for evaluation of pacemaker at [facility] versus outpatient management  I discussed the case with Dr. Johnny Bridge cardiology, EKG does show heart block. I have talk with the patient and advised her to have her family bring her to [facility] for cardiology evaluation for the probability of a pacemaker insertion. Patient stated that she will be going momentarily.

## 2015-09-18 NOTE — ED Notes (Signed)
Pt reports recent sinus congestion, had syncopal episode x 2 yesterday. Went to pcp today and sent here for cardio consult and admission due to HR of 34. Bp is 154/51 at triage and no acute distress noted at this time.

## 2015-09-18 NOTE — Progress Notes (Signed)
Pt arrived to unit. No complaints of pain. VSS. Pt oriented to unit.   Raliegh Ip RN

## 2015-09-18 NOTE — ED Notes (Signed)
Dr Vanita Panda shown critical EKG, spoke with NF and pt will be next one to be moved to exam room.

## 2015-09-18 NOTE — ED Notes (Signed)
Main lab states they have not received blood that was sent down at 1642. Blood was redrawn by Barbette Or RN at [phone].

## 2015-09-18 NOTE — ED Provider Notes (Signed)
CSN: [id number]     Arrival date & time 09/18/15  1505 History   First MD Initiated Contact with Patient 09/18/15 1645     Chief Complaint  Patient presents with  . Loss of Consciousness  . Bradycardia     (Consider location/radiation/quality/duration/timing/severity/associated sxs/prior Treatment) HPI 65 year old female who presents with syncope. History of hyperlipidemia, migraine headaches, and GERD. Reports 2 syncopal episodes over the past day. States that she has recently been ill with some chest congestion and shortness of breath which she felt was viral infection. Yesterday while cleaning dishes and felt her vision closing in and passed out. She had called out to her husband who caught her and lowered her to the ground. She had a second syncopal episode while sitting on the toilet where again she felt her vision closing in and she had passed out sitting on the toilet. No preceding chest pain, palpitations, or difficulty breathing. States feeling fatigued over the past year, with unclear etiology. Doing daily activities has felt more tired and with shortness of breath. Has noted some intermittent lower extremity edema. No orthopnea or PND. No recent travel or hiking.   Past Medical History  Diagnosis Date  . Allergy   . Hyperlipidemia   . Migraines     Chronic  . Sinusitis   . Acid reflux    Past Surgical History  Procedure Laterality Date  . Tubal ligation    . Appendectomy    . Cholecystectomy    . Tonsillectomy and adenoidectomy    . Colonoscopy    . Breast enhancement surgery  1985   History reviewed. No pertinent family history. Social History  Substance Use Topics  . Smoking status: Former Research scientist (life sciences)  . Smokeless tobacco: Former Systems developer    Quit date: 01/06/2007  . Alcohol Use: None   OB History    No data available     Review of Systems 10/14 systems reviewed and are negative other than those stated in the HPI   Allergies  Beta adrenergic blockers; Ambien;  Biaxin; and Other  Home Medications   Prior to Admission medications   Medication Sig Start Date End Date Taking? Authorizing Provider  alendronate (FOSAMAX) 70 MG tablet Take 1 tablet (70 mg total) by mouth every 7 (seven) days. Take with a full glass of water on an empty stomach. 07/15/14   Nilda Simmer, NP  buPROPion (WELLBUTRIN SR) 150 MG 12 hr tablet TAKE 1 TABLET (150 MG TOTAL) BY MOUTH 2 (TWO) TIMES DAILY. AT LEAST 8 HOURS APART 03/29/15   Kathyrn Drown, MD  cefPROZIL (CEFZIL) 500 MG tablet Take 1 tablet (500 mg total) by mouth 2 (two) times daily. 09/18/15   Kathyrn Drown, MD  clonazePAM (KLONOPIN) 0.5 MG tablet TAKE 1 TABLET BY MOUTH TWO TIMES DAILY AS NEEDED FOR ANXEITY 08/21/15   Nilda Simmer, NP  hydroxypropyl methylcellulose (ISOPTO TEARS) 2.5 % ophthalmic solution Place 1 drop into both eyes 2 (two) times daily.    Historical Provider, MD  Multiple Vitamin (MULTIVITAMIN) tablet Take 1 tablet by mouth daily. Reported on 07/12/2015    Historical Provider, MD  Omega-3 Fatty Acids (FISH OIL) 1200 MG CAPS Take 1,200 mg by mouth daily. Reported on 07/12/2015    Historical Provider, MD  rizatriptan (MAXALT-MLT) 10 MG disintegrating tablet TAKE ONE TABLET BY MOUTH AT ONSET OF MIGRAINE MAY REPEAT IN 2 HOURS IF NEEDED MAX 2 PER 24 HOURS 09/01/15   Mikey Kirschner, MD  topiramate (TOPAMAX) 100 MG  tablet TAKE 1 TABLET (100 MG TOTAL) BY MOUTH 2 (TWO) TIMES DAILY. 01/09/15   Nilda Simmer, NP   BP 140/54 mmHg  Pulse 35  Temp(Src) 98.5 F (36.9 C) (Oral)  Resp 18  Ht 5\' 4"  (1.626 m)  Wt 148 lb (67.132 kg)  BMI 25.39 kg/m2  SpO2 100% Physical Exam Physical Exam  Nursing note and vitals reviewed. Constitutional: Well developed, well nourished, non-toxic, and in no acute distress Head: Normocephalic and atraumatic.  Mouth/Throat: Oropharynx is clear and moist.  Neck: Normal range of motion. Neck supple.  Cardiovascular: Bardycardic rate and regular rhythm.  No LE  edema. Pulmonary/Chest: Effort normal and breath sounds normal.  Abdominal: Soft. There is no tenderness. There is no rebound and no guarding.  Musculoskeletal: Normal range of motion.  Neurological: Alert, no facial droop, fluent speech, moves all extremities symmetrically Skin: Skin is warm and dry.  Psychiatric: Cooperative  ED Course  Procedures (including critical care time) Labs Review Labs Reviewed  BASIC METABOLIC PANEL  CBC  URINALYSIS, ROUTINE W REFLEX MICROSCOPIC (NOT AT [facility])  BRAIN NATRIURETIC PEPTIDE  CBC  CREATININE, SERUM  CBG MONITORING, ED  I-STAT TROPOININ, ED    Imaging Review Dg Chest Portable 1 View  09/18/2015  CLINICAL DATA:  Shortness of breath today EXAM: PORTABLE CHEST 1 VIEW COMPARISON:  Portable exam 1708 hours without priors for comparison. FINDINGS: External pacing leads project over chest. Lordotic positioning. Normal heart size, mediastinal contours and pulmonary vascularity. Lungs grossly clear, question hyperinflated. No pleural effusion or pneumothorax. Bones demineralized. BILATERAL breast prostheses noted. IMPRESSION: Question hyperinflation. No acute abnormalities. Electronically Signed   By: Lavonia Nahzir Pohle M.D.   On: 09/18/2015 17:16   I have personally reviewed and evaluated these images and lab results as part of my medical decision-making.   EKG Interpretation   Date/Time:  Monday September 18 2015 16:28:35 EDT Ventricular Rate:  35 PR Interval:    QRS Duration: 108 QT Interval:  566 QTC Calculation: 432 R Axis:   80 Text Interpretation:  Critical Test Result: AV Block Sinus rhythm with  complete heart block and Junctional bradycardia Incomplete right bundle  branch block Nonspecific ST and T wave abnormality Abnormal ECG with  complete heart block No prior EKG  Confirmed by Adair Lemar MD, Tyrea Froberg [id number]) on  09/18/2015 4:46:29 PM     CRITICAL CARE Performed by: Forde Dandy   Total critical care time: 35 minutes  Critical care time was  exclusive of separately billable procedures and treating other patients.  Critical care was necessary to treat or prevent imminent or life-threatening deterioration.  Critical care was time spent personally by me on the following activities: development of treatment plan with patient and/or surrogate as well as nursing, discussions with consultants, evaluation of patient's response to treatment, examination of patient, obtaining history from patient or surrogate, ordering and performing treatments and interventions, ordering and review of laboratory studies, ordering and review of radiographic studies, pulse oximetry and re-evaluation of patient's condition.  MDM   Final diagnoses:  Complete heart block [facility])   History 65 year old female who presents with 2 syncopal episodes. Triage EKG suggestive of complete heart block with junctional escape rhythm. She is mentating normally, bradycardic with heart rate in the 30s to 60s. Is normotensive. Pacing pads are placed, but currently asymptomatic. No acute ischemic changes, and troponin is negative. Discussed with Dr. Oval Linsey from cardiology who will admit for potential pacemaker placement.   Forde Dandy, MD 09/18/15 T[facility] Moore

## 2015-09-18 NOTE — H&P (Signed)
[name] is an 65 y.o. female.   Chief Complaint: syncope X 2 HPI: the patient is a 65 yo woman with a h/o good health. She passed out unexpectedly yesterday and came to the ER after presenting to Dr. Lance Sell office with CHB and a slow VR. She feels well and did not know she was in heart block. She has never had syncope before yesterday and she denies chest pain, sob, arthritic problems, lung problems or a family h/o bradycardia or cardiomyopathy. Her ECG shows CHB with a QRS duration of 105 and a ventricular escape in the mid 30's.  Past Medical History  Diagnosis Date  . Allergy   . Hyperlipidemia   . Migraines     Chronic  . Sinusitis   . Acid reflux     Past Surgical History  Procedure Laterality Date  . Tubal ligation    . Appendectomy    . Cholecystectomy    . Tonsillectomy and adenoidectomy    . Colonoscopy    . Breast enhancement surgery  1985    History reviewed. No pertinent family history. Neither her mother of father has a PPM . He father has heart disease at an older age. Social History:  reports that she has quit smoking. She quit smokeless tobacco use about 8 years ago. Her alcohol and drug histories are not on file.  Allergies:  Allergies  Allergen Reactions  . Beta Adrenergic Blockers Shortness Of Breath  . Ambien [Zolpidem Tartrate]     Hallucinations  . Biaxin [Clarithromycin]     May have caused hives see 06/19/15  . Other     Beta blockers:shortness of breath     (Not in a hospital admission)  Results for orders placed or performed during the hospital encounter of 09/18/15 (from the past 48 hour(s))  I-Stat Troponin, ED (not at [facility])     Status: None   Collection Time: 09/18/15  4:56 PM  Result Value Ref Range   Troponin i, poc 0.01 0.00 - 0.08 ng/mL   Comment 3            Comment: Due to the release kinetics of cTnI, a negative result within the first hours of the onset of symptoms does not rule out myocardial infarction with  certainty. If myocardial infarction is still suspected, repeat the test at appropriate intervals.   CBG monitoring, ED     Status: None   Collection Time: 09/18/15  5:29 PM  Result Value Ref Range   Glucose-Capillary 78 65 - 99 mg/dL   Dg Chest Portable 1 View  09/18/2015  CLINICAL DATA:  Shortness of breath today EXAM: PORTABLE CHEST 1 VIEW COMPARISON:  Portable exam 1708 hours without priors for comparison. FINDINGS: External pacing leads project over chest. Lordotic positioning. Normal heart size, mediastinal contours and pulmonary vascularity. Lungs grossly clear, question hyperinflated. No pleural effusion or pneumothorax. Bones demineralized. BILATERAL breast prostheses noted. IMPRESSION: Question hyperinflation. No acute abnormalities. Electronically Signed   By: Lavonia Dana M.D.   On: 09/18/2015 17:16    ROS all systems reviewed and negative as per the HPI  Blood pressure 140/54, pulse 35, temperature 98.5 F (36.9 C), temperature source Oral, resp. rate 18, height 5\' 4"  (1.626 m), weight 148 lb (67.132 kg), SpO2 100 %.  Well appearing NAD HEENT: Unremarkable,[place], AT Neck:  6 JVD, no thyromegally Back:  No CVA tenderness Lungs:  Clear with no wheezes, rales, or rhonchi HEART:  Regular brady rhythm, no murmurs, no  rubs, no clicks Abd:  soft, positive bowel sounds, no organomegally, no rebound, no guarding Ext:  2 plus pulses, no edema, no cyanosis, no clubbing Skin:  No rashes no nodules Neuro:  CN II through XII intact, motor grossly intact Physical Exam  EKG: normal EKG, normal sinus rhythm, unchanged from previous tracings, normal sinus rhythm with complete heart block  cxr - pending  Assessment/Plan 1. Complete heart block - I have discussed the treatment options with the patient and her husband. The risks/benefits/goals/expectations of PPM insertion were reviewed with the patient and she wishes to proceed. 2. Dyslipidemia - she will continue her current meds. 3. HTN - I  do not think she carries this diagnosis. She may require a beta blocker after her PPM insertion.  Gregg Taylor,M.D. 09/18/2015, 5:54 PM

## 2015-09-19 ENCOUNTER — Encounter (HOSPITAL_COMMUNITY)
Admission: EM | Disposition: A | Discharge status: Home / Self Care | Payer: Self-pay | Source: Home / Self Care | Attending: Internal Medicine

## 2015-09-19 ENCOUNTER — Encounter (HOSPITAL_COMMUNITY): Payer: Self-pay | Admitting: Cardiology

## 2015-09-19 DIAGNOSIS — Z950 Presence of cardiac pacemaker: Secondary | ICD-10-CM

## 2015-09-19 HISTORY — PX: EP IMPLANTABLE DEVICE: SHX172B

## 2015-09-19 HISTORY — DX: Presence of cardiac pacemaker: Z95.0

## 2015-09-19 LAB — GLUCOSE, CAPILLARY: GLUCOSE-CAPILLARY: 73 mg/dL (ref 65–99)

## 2015-09-19 LAB — SURGICAL PCR SCREEN
MRSA, PCR: NEGATIVE
Staphylococcus aureus: NEGATIVE

## 2015-09-19 SURGERY — PACEMAKER IMPLANT

## 2015-09-19 MED ORDER — ACETAMINOPHEN 325 MG PO TABS
325.0000 mg | ORAL_TABLET | ORAL | Status: DC | PRN
Start: 2015-09-19 — End: 2015-09-20
  Administered 2015-09-19: 650 mg via ORAL
  Filled 2015-09-19: qty 2

## 2015-09-19 MED ORDER — CEFAZOLIN SODIUM-DEXTROSE 2-3 GM-% IV SOLR
INTRAVENOUS | Status: AC
  Filled 2015-09-19: qty 50

## 2015-09-19 MED ORDER — OXYCODONE-ACETAMINOPHEN 5-325 MG PO TABS
1.0000 | ORAL_TABLET | Freq: Four times a day (QID) | ORAL | Status: DC | PRN
  Administered 2015-09-19 – 2015-09-20 (×3): 2 via ORAL
  Filled 2015-09-19 (×3): qty 2

## 2015-09-19 MED ORDER — LIDOCAINE HCL (PF) 1 % IJ SOLN
INTRAMUSCULAR | Status: AC
  Filled 2015-09-19: qty 30

## 2015-09-19 MED ORDER — LIDOCAINE HCL (PF) 1 % IJ SOLN
INTRAMUSCULAR | Status: DC | PRN
  Administered 2015-09-19: 09:00:00

## 2015-09-19 MED ORDER — MIDAZOLAM HCL 5 MG/5ML IJ SOLN
INTRAMUSCULAR | Status: DC | PRN
  Administered 2015-09-19 (×3): 1 mg via INTRAVENOUS

## 2015-09-19 MED ORDER — SODIUM CHLORIDE 0.9 % IR SOLN
Status: AC
  Filled 2015-09-19: qty 2

## 2015-09-19 MED ORDER — CEFAZOLIN SODIUM 1-5 GM-% IV SOLN
1.0000 g | Freq: Four times a day (QID) | INTRAVENOUS | Status: AC
  Administered 2015-09-19 – 2015-09-20 (×3): 1 g via INTRAVENOUS
  Filled 2015-09-19 (×3): qty 50

## 2015-09-19 MED ORDER — FENTANYL CITRATE (PF) 100 MCG/2ML IJ SOLN
INTRAMUSCULAR | Status: AC
  Filled 2015-09-19: qty 2

## 2015-09-19 MED ORDER — FENTANYL CITRATE (PF) 100 MCG/2ML IJ SOLN
INTRAMUSCULAR | Status: DC | PRN
  Administered 2015-09-19: 25 ug via INTRAVENOUS

## 2015-09-19 MED ORDER — HEPARIN (PORCINE) IN NACL 2-0.9 UNIT/ML-% IJ SOLN
INTRAMUSCULAR | Status: AC
  Filled 2015-09-19: qty 500

## 2015-09-19 MED ORDER — ONDANSETRON HCL 4 MG/2ML IJ SOLN: 4.0000 mg | Freq: Four times a day (QID) | INTRAMUSCULAR | Status: DC | PRN

## 2015-09-19 MED ORDER — MIDAZOLAM HCL 5 MG/5ML IJ SOLN
INTRAMUSCULAR | Status: AC
  Filled 2015-09-19: qty 5

## 2015-09-19 MED ORDER — CEFAZOLIN SODIUM-DEXTROSE 2-3 GM-% IV SOLR
INTRAVENOUS | Status: DC | PRN
  Administered 2015-09-19: 2 g via INTRAVENOUS

## 2015-09-19 SURGICAL SUPPLY — 7 items
CABLE SURGICAL S-101-97-12 (CABLE) ×4 IMPLANT
LEAD TENDRIL MRI 52CM LPA1200M (Lead) ×2 IMPLANT
LEAD TENDRIL MRI 58CM LPA1200M (Lead) ×2 IMPLANT
PACEMAKER ASSURITY DR-RF (Pacemaker) ×2 IMPLANT
PAD DEFIB LIFELINK (PAD) ×2 IMPLANT
SHEATH CLASSIC 8F (SHEATH) ×4 IMPLANT
TRAY PACEMAKER INSERTION (PACKS) ×2 IMPLANT

## 2015-09-19 NOTE — Progress Notes (Signed)
SUBJECTIVE: The patient is doing well today.  At this time, she denies chest pain, shortness of breath, or any new concerns.  She has felt sick since January saying that she has been run down.   Marland Kitchen  ceFAZolin (ANCEF) IV  2 g Intravenous To Cath  . gentamicin irrigation  80 mg Irrigation To Cath   . sodium chloride 50 mL/hr at 09/19/15 0617    OBJECTIVE: Physical Exam: Filed Vitals:   09/18/15 1830 09/18/15 1904 09/19/15 0039 09/19/15 0426  BP: 112/58 131/43 126/45 115/35  Pulse: 38 36 30 35  Temp:  98.1 F (36.7 C)  97.9 F (36.6 C)  TempSrc:  Oral  Oral  Resp: 12 18  15   Height:  5\' 4"  (1.626 m)    Weight:  152 lb 1.9 oz (69 kg)    SpO2: 100% 100%  100%   No intake or output data in the 24 hours ending 09/19/15 0724  Telemetry reveals sinus rhythm  GEN- The patient is well appearing, alert and oriented x 3 today.   Head- normocephalic, atraumatic Eyes-  Sclera clear, conjunctiva pink Ears- hearing intact Oropharynx- clear Neck- supple, no JVP Lymph- no cervical lymphadenopathy Lungs- Clear to ausculation bilaterally, normal work of breathing Heart- Regular rate and rhythm, no murmurs, rubs or gallops, PMI not laterally displaced GI- soft, NT, ND, + BS Extremities- no clubbing, cyanosis, or edema Skin- no rash or lesion Psych- euthymic mood, full affect Neuro- strength and sensation are intact  LABS: Basic Metabolic Panel:  Recent Labs  09/18/15 1805 09/18/15 1914  NA 143  --   K 5.2*  --   CL 110  --   CO2 24  --   GLUCOSE 84  --   BUN 14  --   CREATININE 0.95 1.01*  CALCIUM 8.7*  --    Liver Function Tests: No results for input(s): AST, ALT, ALKPHOS, BILITOT, PROT, ALBUMIN in the last 72 hours. No results for input(s): LIPASE, AMYLASE in the last 72 hours. CBC:  Recent Labs  09/18/15 1914  WBC 5.8  HGB 12.9  HCT 40.2  MCV 95.3  PLT 174   Cardiac Enzymes: No results for input(s): CKTOTAL, CKMB, CKMBINDEX, TROPONINI in the last 72  hours. BNP: Invalid input(s): POCBNP D-Dimer: No results for input(s): DDIMER in the last 72 hours. Hemoglobin A1C: No results for input(s): HGBA1C in the last 72 hours. Fasting Lipid Panel: No results for input(s): CHOL, HDL, LDLCALC, TRIG, CHOLHDL, LDLDIRECT in the last 72 hours. Thyroid Function Tests: No results for input(s): TSH, T4TOTAL, T3FREE, THYROIDAB in the last 72 hours.  Invalid input(s): FREET3 Anemia Panel: No results for input(s): VITAMINB12, FOLATE, FERRITIN, TIBC, IRON, RETICCTPCT in the last 72 hours.  RADIOLOGY: Dg Chest Portable 1 View  09/18/2015  CLINICAL DATA:  Shortness of breath today EXAM: PORTABLE CHEST 1 VIEW COMPARISON:  Portable exam 1708 hours without priors for comparison. FINDINGS: External pacing leads project over chest. Lordotic positioning. Normal heart size, mediastinal contours and pulmonary vascularity. Lungs grossly clear, question hyperinflated. No pleural effusion or pneumothorax. Bones demineralized. BILATERAL breast prostheses noted. IMPRESSION: Question hyperinflation. No acute abnormalities. Electronically Signed   By: Lavonia Dana M.D.   On: 09/18/2015 17:16    ASSESSMENT AND PLAN:  Active Problems:   Complete heart block ([place])  Plan for dual chamber pacemaker today for complete heart block.  HR in the 30s currently.  Risks and benefits explained.  Risks include but are not limited to bleeding,  infection, tamponade, pneumothorax.  The patient understands the risks and has agreed to the procedure.   Will Meredith Leeds, MD 09/19/2015 7:24 AM

## 2015-09-19 NOTE — Discharge Summary (Signed)
ELECTROPHYSIOLOGY PROCEDURE DISCHARGE SUMMARY    Patient ID: [name],  MRN: [id number], DOB/AGE: Oct 02, 1950 65 y.o.  Admit date: 09/18/2015 Discharge date: 09/20/2015  Primary Care Physician: [name], MD Electrophysiologist: [facility]  Primary Discharge Diagnosis:  Symptomatic complete heart block status post pacemaker implantation this admission  Secondary Discharge Diagnosis:  1.  Hyperlipidemia  Allergies  Allergen Reactions  . Beta Adrenergic Blockers Shortness Of Breath  . Ambien [Zolpidem Tartrate] Other (See Comments)    Hallucinations  . Biaxin [Clarithromycin] Hives    May have caused hives see 06/19/15     Procedures This Admission:  1.  Implantation of a STJ dual chamber PPM on 09/19/15 by Dr Curt Bears.  The patient received a STJ model number Assurity PPM with model number [id number] right atrial lead and [id number] right ventricular lead. There were no immediate post procedure complications. 2.  CXR on 09/20/15 demonstrated no pneumothorax status post device implantation.   Brief HPI/Hospital Course:  [name] is a 65 y.o. female with no significant past medical history.  She presented to her PCP's office on the day of admission and was found to be in complete heart block. She was referred to [facility] for further evaluation. She was seen by Dr Lovena Le and pacemaker implantation was recommended with no reversible causes identified.  Risks, benefits, and alternatives to PPM implantation were reviewed with the patient who wished to proceed.  The patient underwent implantation of a STJ dual chamber pacemaker with details as outlined above.  She  was monitored on telemetry overnight which demonstrated sinus rhythm with complete heart block.  Left chest was without hematoma or ecchymosis.  The device was interrogated and found to be functioning normally.  CXR was obtained and demonstrated no pneumothorax status post device implantation.  Wound care, arm mobility,  and restrictions were reviewed with the patient.  The patient was examined and considered stable for discharge to home.    Physical Exam: Filed Vitals:   09/19/15 1100 09/19/15 1130 09/19/15 2256 09/20/15 0603  BP: 122/54 104/82 106/49 124/57  Pulse: 64 61 56 59  Temp:   97.8 F (36.6 C) 97.5 F (36.4 C)  TempSrc:   Oral Oral  Resp: 18 18 18 18   Height:      Weight:      SpO2: 100% 100% 95% 99%    GEN- The patient is well appearing, alert and oriented x 3 today.   HEENT: normocephalic, atraumatic; sclera clear, conjunctiva pink; hearing intact; oropharynx clear; neck supple  Lungs- Clear to ausculation bilaterally, normal work of breathing.  No wheezes, rales, rhonchi Heart- Regular rate and rhythm, no murmurs, rubs or gallops  GI- soft, non-tender, non-distended, bowel sounds present  Extremities- no clubbing, cyanosis, or edema; DP/PT/radial pulses 2+ bilaterally MS- no significant deformity or atrophy Skin- warm and dry, no rash or lesion, left chest without hematoma/ecchymosis Psych- euthymic mood, full affect Neuro- strength and sensation are intact   Labs:   Lab Results  Component Value Date   WBC 5.8 09/18/2015   HGB 12.9 09/18/2015   HCT 40.2 09/18/2015   MCV 95.3 09/18/2015   PLT 174 09/18/2015     Recent Labs Lab 09/18/15 1805 09/18/15 1914  NA 143  --   K 5.2*  --   CL 110  --   CO2 24  --   BUN 14  --   CREATININE 0.95 1.01*  CALCIUM 8.7*  --   GLUCOSE 84  --  Discharge Medications:    Medication List    TAKE these medications        alendronate 70 MG tablet  Commonly known as:  FOSAMAX  Take 1 tablet (70 mg total) by mouth every 7 (seven) days. Take with a full glass of water on an empty stomach.     Biotin 5000 MCG Caps  Take 5,000 mcg by mouth daily.     buPROPion 150 MG 12 hr tablet  Commonly known as:  WELLBUTRIN SR  TAKE 1 TABLET (150 MG TOTAL) BY MOUTH 2 (TWO) TIMES DAILY. AT LEAST 8 HOURS APART     cefPROZIL 500 MG tablet    Commonly known as:  CEFZIL  Take 1 tablet (500 mg total) by mouth 2 (two) times daily.     clonazePAM 0.5 MG tablet  Commonly known as:  KLONOPIN  TAKE 1 TABLET BY MOUTH TWO TIMES DAILY AS NEEDED FOR ANXEITY     fexofenadine 180 MG tablet  Commonly known as:  ALLEGRA  Take 180 mg by mouth daily.     lansoprazole 15 MG capsule  Commonly known as:  PREVACID  Take 15 mg by mouth daily at 12 noon.     multivitamin with minerals Tabs tablet  Take 1 tablet by mouth daily.     oxyCODONE-acetaminophen 5-325 MG tablet  Commonly known as:  PERCOCET/ROXICET  Take 1-2 tablets by mouth every 6 (six) hours as needed for moderate pain.     PSYLLIUM PO  Take 6 capsules by mouth at bedtime.     rizatriptan 10 MG disintegrating tablet  Commonly known as:  MAXALT-MLT  TAKE ONE TABLET BY MOUTH AT ONSET OF MIGRAINE MAY REPEAT IN 2 HOURS IF NEEDED MAX 2 PER 24 HOURS     THERATEARS OP  Place 5 drops into both eyes daily.     topiramate 100 MG tablet  Commonly known as:  TOPAMAX  TAKE 1 TABLET (100 MG TOTAL) BY MOUTH 2 (TWO) TIMES DAILY.     triamcinolone 55 MCG/ACT Aero nasal inhaler  Commonly known as:  NASACORT  Place 1 spray into both nostrils 2 (two) times daily.     vitamin C 500 MG tablet  Commonly known as:  ASCORBIC ACID  Take 500 mg by mouth 2 (two) times daily.        Disposition:  Discharge Instructions    Diet - low sodium heart healthy    Complete by:  As directed      Increase activity slowly    Complete by:  As directed           Follow-up Information    Follow up with [facility] On 09/28/2015.   Specialty:  Cardiology   Why:  at 3:30PM for wound check    Contact information:   [address] [place] [phone]      Follow up with Danialle Dement Meredith Leeds, MD On 12/19/2015.   Specialty:  Cardiology   Why:  at 10:45AM    Contact information:   [place]  [address] [phone]       Duration of Discharge Encounter: Greater than 30 minutes including physician time.  Signed, Chanetta Marshall, NP 09/20/2015 7:00 AM   I have seen and examined this patient with Chanetta Marshall.  Agree with above, note added to reflect my findings.  On exam, regular rhythm, no murmurs, lungs clear.  Had dual chamber pacemaker  placed yesterday for complete AV block.  Device interrogation without abnormality this AM, CXR shows stable leads and no pneumothorax.  Plan for discharge home with follow up in device clinic in 10 days..    Prashant Glosser M. Paco Cislo MD 09/20/2015 8:45 AM

## 2015-09-19 NOTE — Discharge Instructions (Signed)
° ° °  Supplemental Discharge Instructions for  Pacemaker/Defibrillator Patients  Activity No heavy lifting or vigorous activity with your left/right arm for 6 to 8 weeks.  Do not raise your left/right arm above your head for one week.  Gradually raise your affected arm as drawn below.           __       09/24/15                      09/25/15                      09/26/15                  09/27/15  NO DRIVING until wound check   WOUND CARE - Keep the wound area clean and dry.  Do not get this area wet for one week. No showers for one week; you may shower on  09/27/15   . - The tape/steri-strips on your wound will fall off; do not pull them off.  No bandage is needed on the site.  DO  NOT apply any creams, oils, or ointments to the wound area. - If you notice any drainage or discharge from the wound, any swelling or bruising at the site, or you develop a fever > 101? F after you are discharged home, call the office at once.  Special Instructions - You are still able to use cellular telephones; use the ear opposite the side where you have your pacemaker/defibrillator.  Avoid carrying your cellular phone near your device. - When traveling through airports, show security personnel your identification card to avoid being screened in the metal detectors.  Ask the security personnel to use the hand wand. - Avoid arc welding equipment, MRI testing (magnetic resonance imaging), TENS units (transcutaneous nerve stimulators).  Call the office for questions about other devices. - Avoid electrical appliances that are in poor condition or are not properly grounded. - Microwave ovens are safe to be near or to operate.

## 2015-09-19 NOTE — Progress Notes (Signed)
Utilization review completed.  

## 2015-09-19 NOTE — Progress Notes (Signed)
Orthopedic Tech Progress Note Patient Details:  [name] [birth year] [id number]  Ortho Devices Type of Ortho Device: Arm sling   [place] Pink 09/19/2015, 9:50 AM

## 2015-09-19 NOTE — Progress Notes (Signed)
Pt received from cath lab s/p pacemaker placement to L chest wall.  Assessment complete, no c/o pain or distress.  Pt education strongly regarding restricted use of LUE, not turning on the affected side and BR with BRP for 24hrs.  She expresses understanding. Family at bedside also educated and encouraged to help remind pt of restrictions.

## 2015-09-20 ENCOUNTER — Inpatient Hospital Stay (HOSPITAL_COMMUNITY): Payer: BLUE CROSS/BLUE SHIELD

## 2015-09-20 ENCOUNTER — Encounter: Payer: Self-pay | Admitting: Nurse Practitioner

## 2015-09-20 MED ORDER — OXYCODONE-ACETAMINOPHEN 5-325 MG PO TABS: 1.0000 | ORAL_TABLET | Freq: Four times a day (QID) | ORAL | Status: DC | PRN

## 2015-09-20 NOTE — Progress Notes (Signed)
Order to discharge received, pt assessment complete and consistent with report received from night shift RN.  IV and tele discontinued, CCMD notified.

## 2015-09-20 NOTE — Progress Notes (Signed)
D/C instructions have been reviewed with pt and spouse.  They express understanding.  Pt expecting PA to leave her a work excuse, which was not in D/C info.  PA notified and she said she will be back in around 21min to get the note for pt.

## 2015-09-21 ENCOUNTER — Ambulatory Visit: Payer: Self-pay | Admitting: Cardiovascular Disease

## 2015-09-23 ENCOUNTER — Emergency Department (HOSPITAL_COMMUNITY)
Admission: EM | Admit: 2015-09-23 | Discharge: 2015-09-23 | Disposition: A | Discharge status: Home / Self Care | Payer: BLUE CROSS/BLUE SHIELD | Attending: Emergency Medicine | Admitting: Emergency Medicine

## 2015-09-23 ENCOUNTER — Encounter (HOSPITAL_COMMUNITY): Payer: Self-pay

## 2015-09-23 DIAGNOSIS — I442 Atrioventricular block, complete: Secondary | ICD-10-CM | POA: Insufficient documentation

## 2015-09-23 DIAGNOSIS — E785 Hyperlipidemia, unspecified: Secondary | ICD-10-CM | POA: Diagnosis not present

## 2015-09-23 DIAGNOSIS — H538 Other visual disturbances: Secondary | ICD-10-CM | POA: Diagnosis not present

## 2015-09-23 DIAGNOSIS — Z79899 Other long term (current) drug therapy: Secondary | ICD-10-CM | POA: Insufficient documentation

## 2015-09-23 DIAGNOSIS — R55 Syncope and collapse: Secondary | ICD-10-CM | POA: Insufficient documentation

## 2015-09-23 DIAGNOSIS — R0981 Nasal congestion: Secondary | ICD-10-CM | POA: Diagnosis not present

## 2015-09-23 DIAGNOSIS — R42 Dizziness and giddiness: Secondary | ICD-10-CM | POA: Diagnosis not present

## 2015-09-23 DIAGNOSIS — Z87891 Personal history of nicotine dependence: Secondary | ICD-10-CM | POA: Insufficient documentation

## 2015-09-23 DIAGNOSIS — K219 Gastro-esophageal reflux disease without esophagitis: Secondary | ICD-10-CM | POA: Insufficient documentation

## 2015-09-23 DIAGNOSIS — R61 Generalized hyperhidrosis: Secondary | ICD-10-CM | POA: Diagnosis not present

## 2015-09-23 LAB — BASIC METABOLIC PANEL
Anion gap: 10 (ref 5–15)
BUN: 11 mg/dL (ref 6–20)
CALCIUM: 8.4 mg/dL — AB (ref 8.9–10.3)
CO2: 20 mmol/L — ABNORMAL LOW (ref 22–32)
Chloride: 110 mmol/L (ref 101–111)
Creatinine, Ser: 0.71 mg/dL (ref 0.44–1.00)
GFR calc Af Amer: >60 mL/min (ref >60)
GLUCOSE: 92 mg/dL (ref 65–99)
Potassium: 3.8 mmol/L (ref 3.5–5.1)
Sodium: 140 mmol/L (ref 135–145)

## 2015-09-23 LAB — CBC WITH DIFFERENTIAL/PLATELET
BASOS ABS: 0 10*3/uL (ref 0.0–0.1)
BASOS PCT: 0 %
EOS PCT: 6 %
Eosinophils Absolute: 0.4 10*3/uL (ref 0.0–0.7)
HCT: 39.5 % (ref 36.0–46.0)
Hemoglobin: 12.6 g/dL (ref 12.0–15.0)
LYMPHS PCT: 28 %
Lymphs Abs: 2.1 10*3/uL (ref 0.7–4.0)
MCH: 30.3 pg (ref 26.0–34.0)
MCHC: 31.9 g/dL (ref 30.0–36.0)
MCV: 95 fL (ref 78.0–100.0)
MONO ABS: 0.6 10*3/uL (ref 0.1–1.0)
Monocytes Relative: 8 %
Neutro Abs: 4.4 10*3/uL (ref 1.7–7.7)
Neutrophils Relative %: 58 %
PLATELETS: 171 10*3/uL (ref 150–400)
RBC: 4.16 MIL/uL (ref 3.87–5.11)
RDW: 15 % (ref 11.5–15.5)
WBC: 7.4 10*3/uL (ref 4.0–10.5)

## 2015-09-23 LAB — URINALYSIS, ROUTINE W REFLEX MICROSCOPIC
Bilirubin Urine: NEGATIVE
Glucose, UA: NEGATIVE mg/dL
Hgb urine dipstick: NEGATIVE
Ketones, ur: NEGATIVE mg/dL
LEUKOCYTES UA: NEGATIVE
NITRITE: NEGATIVE
PROTEIN: NEGATIVE mg/dL
Specific Gravity, Urine: 1.014 (ref 1.005–1.030)
pH: 6.5 (ref 5.0–8.0)

## 2015-09-23 NOTE — Discharge Instructions (Signed)
It will be very important to follow up with your Cardiology appointment on 4/27. You should discuss these events at that time since your pacemaker may need further adjustment if your exertion tolerance remains so drastically decreased. You should limit your exertions until that time since increased demand is likely what has provoked these episodes until now.

## 2015-09-23 NOTE — ED Notes (Signed)
Patient undressed, in gown, on monitor, continuous pulse oximetry and blood pressure cuff 

## 2015-09-23 NOTE — ED Notes (Signed)
Interrogated pacemaker.  BS called and stated no irregularities.  MD notified.

## 2015-09-23 NOTE — ED Provider Notes (Signed)
CSN: [id number]     Arrival date & time 09/23/15  1339 History   First MD Initiated Contact with Patient 09/23/15 1359     Chief Complaint  Patient presents with  . Pacemaker Problem  . Near Syncope    (Consider location/radiation/quality/duration/timing/severity/associated sxs/prior Treatment) Patient is a 65 y.o. female presenting with near-syncope. The history is provided by the patient and the spouse.  Near Syncope Associated symptoms include congestion and diaphoresis. Pertinent negatives include no chest pain or rash.   65 y/o woman with Hx of complete heart block s/p pacemaker placement 09/19/15 presents after a presyncopal episode. She was walking around [place] with her husband for about 15 minutes when she suddenly felt lightheaded, nauseated, diaphoretic, and spots in her vision. She was feeling similar symptoms to a lesser extent daily since her hospitalization each time resolved with rest. She denies any loss of consciousness or fall. She has been feeling somewhat ill lately and is taking several treatments for ongoing sinusitis. She does also suffer periodic migraines with somewhat similar presentations of lightheadedness, nausea, and aura but states this felt different.  Past Medical History  Diagnosis Date  . Allergy   . Hyperlipidemia   . Migraines     Chronic  . Sinusitis   . Acid reflux   . Complete heart block ([place]) 09/2015  . Presence of permanent cardiac pacemaker 09/19/2015   Past Surgical History  Procedure Laterality Date  . Tubal ligation    . Appendectomy    . Cholecystectomy    . Tonsillectomy and adenoidectomy    . Colonoscopy    . Breast enhancement surgery  1985  . Ep implantable device N/A 09/19/2015    Procedure: Pacemaker Implant;  Surgeon: Will Meredith Leeds, MD;  Location: [facility];  Service: Cardiovascular;  Laterality: N/A;   History reviewed. No pertinent family history. Social History  Substance Use Topics  . Smoking status: Former  Research scientist (life sciences)  . Smokeless tobacco: Former Systems developer    Quit date: 01/06/2007  . Alcohol Use: Yes     Comment: occasional    09/19/2015 " I dont drink anymore "   OB History    No data available     Review of Systems  Constitutional: Positive for diaphoresis.  HENT: Positive for congestion and sinus pressure.   Eyes: Positive for visual disturbance.  Respiratory: Negative for chest tightness.   Cardiovascular: Positive for palpitations and near-syncope. Negative for chest pain.  Gastrointestinal: Negative for diarrhea and constipation.  Endocrine: Negative for polydipsia.  Genitourinary: Negative for pelvic pain.  Musculoskeletal: Negative for gait problem.  Skin: Negative for rash.  Neurological: Positive for dizziness and light-headedness.      Allergies  Beta adrenergic blockers; Ambien; and Biaxin  Home Medications   Prior to Admission medications   Medication Sig Start Date End Date Taking? Authorizing Provider  alendronate (FOSAMAX) 70 MG tablet Take 1 tablet (70 mg total) by mouth every 7 (seven) days. Take with a full glass of water on an empty stomach. Patient taking differently: Take 70 mg by mouth every 7 (seven) days. Take with a full glass of water on an empty stomach. - on Sundays 07/15/14  Yes Nilda Simmer, NP  Biotin 5000 MCG CAPS Take 5,000 mcg by mouth daily.   Yes Historical Provider, MD  buPROPion (WELLBUTRIN SR) 150 MG 12 hr tablet TAKE 1 TABLET (150 MG TOTAL) BY MOUTH 2 (TWO) TIMES DAILY. AT LEAST 8 HOURS APART 03/29/15  Yes Scott A  Luking, MD  Carboxymethylcellulose Sodium (THERATEARS OP) Place 5 drops into both eyes daily.   Yes Historical Provider, MD  cefPROZIL (CEFZIL) 500 MG tablet Take 1 tablet (500 mg total) by mouth 2 (two) times daily. Patient taking differently: Take 500 mg by mouth 2 (two) times daily. 10 day course prescribed 09/18/15 09/18/15  Yes Kathyrn Drown, MD  Chlorpheniramine-PSE-Ibuprofen (ADVIL ALLERGY SINUS) 2-30-200 MG TABS Take 2 tablets  by mouth daily as needed (for congestion).   Yes Historical Provider, MD  clonazePAM (KLONOPIN) 0.5 MG tablet TAKE 1 TABLET BY MOUTH TWO TIMES DAILY AS NEEDED FOR ANXEITY Patient taking differently: TAKE 1 TABLET BY MOUTH DAILY AT BEDTIME 08/21/15  Yes Nilda Simmer, NP  fexofenadine (ALLEGRA) 180 MG tablet Take 180 mg by mouth daily.   Yes Historical Provider, MD  lansoprazole (PREVACID) 15 MG capsule Take 15 mg by mouth daily at 12 noon.   Yes Historical Provider, MD  Multiple Vitamin (MULTIVITAMIN WITH MINERALS) TABS tablet Take 1 tablet by mouth daily.   Yes Historical Provider, MD  oxyCODONE-acetaminophen (PERCOCET/ROXICET) 5-325 MG tablet Take 1-2 tablets by mouth every 6 (six) hours as needed for moderate pain. 09/20/15  Yes Amber Sena Slate, NP  PSYLLIUM PO Take 6 capsules by mouth at bedtime.   Yes Historical Provider, MD  rizatriptan (MAXALT-MLT) 10 MG disintegrating tablet TAKE ONE TABLET BY MOUTH AT ONSET OF MIGRAINE MAY REPEAT IN 2 HOURS IF NEEDED MAX 2 PER 24 HOURS 09/01/15  Yes Mikey Kirschner, MD  topiramate (TOPAMAX) 100 MG tablet TAKE 1 TABLET (100 MG TOTAL) BY MOUTH 2 (TWO) TIMES DAILY. 01/09/15  Yes Nilda Simmer, NP  triamcinolone (NASACORT) 55 MCG/ACT AERO nasal inhaler Place 1 spray into both nostrils 2 (two) times daily.   Yes Historical Provider, MD  vitamin C (ASCORBIC ACID) 500 MG tablet Take 500 mg by mouth 2 (two) times daily.   Yes Historical Provider, MD   BP 146/80 mmHg  Pulse 79  Temp(Src) 97.4 F (36.3 C) (Oral)  Resp 16  SpO2 100% Physical Exam GENERAL- alert, co-operative, NAD HEENT- No sinus tenderness to palpation, TMs clear b/l, conjunctiva noninjected, oral mucosa appears moist, no cervical LN enlargement, no JVD CARDIAC- RRR, no murmurs, rubs or gallops. RESP- CTAB, no wheezes or crackles. NEURO- No obvious Cr N abnormality, strength upper and lower extremities- 5/5 EXTREMITIES- symmetric, no pedal edema. SKIN- Warm, dry, No rash or lesion. PSYCH-  Normal mood and affect, appropriate thought content and speech.   ED Course  Procedures (including critical care time) Labs Review Labs Reviewed  CBC WITH DIFFERENTIAL/PLATELET  BASIC METABOLIC PANEL  URINALYSIS, ROUTINE W REFLEX MICROSCOPIC (NOT AT [facility])    Imaging Review No results found. I have personally reviewed and evaluated these images and lab results as part of my medical decision-making.   EKG Interpretation   Date/Time:  Saturday September 23 2015 13:59:57 EDT Ventricular Rate:  77 PR Interval:  220 QRS Duration: 121 QT Interval:  441 QTC Calculation: 499 R Axis:   108 Text Interpretation:  AV sequential or dual chamber electronic pacemaker  Confirmed by Ashok Cordia  MD, Lennette Bihari ([id number]) on 09/23/2015 2:05:23 PM      MDM   Final diagnoses:  Pre-syncope  Complete heart block ([place])   Patient with recent pacemaker placement for symptomatic complete heart block now having presyncopal episodes. Interrogation of the device reveals no abnormalities and EKG is consistent with a paced rhythm. This would suggest against an occult arrhythmia underlying the  episode. Vital signs entirely stable during the encounter. This could be poor exertional tolerance due to the device or from worsened cardiac function. Laboratory tests were obtained to rule out other causes of syncope or worsening exercise capacity. If these findings are benign it is most appropriate for the patient to follow up with her cardiologist in an outpatient setting and limit her exertions until that time.    Collier Salina, MD 09/23/15 [place], MD 09/24/15 [phone]

## 2015-09-23 NOTE — ED Notes (Addendum)
Per EMS - pt seen at [facility] Monday, found to have complete heart block. Pacemaker put in Tuesday. Pt went to [employer] today, became lightheaded/dizzy, initially short of breath. Denies CP/nausea. VSS. Rate 82 bpm regular. cbg 108. bp 158/82.   Pt also recently treated for URI, currently on abx for infection.

## 2015-09-25 ENCOUNTER — Encounter: Payer: Self-pay | Admitting: Family Medicine

## 2015-09-28 ENCOUNTER — Encounter: Payer: Self-pay | Admitting: Cardiology

## 2015-09-28 ENCOUNTER — Ambulatory Visit (INDEPENDENT_AMBULATORY_CARE_PROVIDER_SITE_OTHER): Payer: BLUE CROSS/BLUE SHIELD | Admitting: *Deleted

## 2015-09-28 ENCOUNTER — Other Ambulatory Visit: Payer: Self-pay | Admitting: Nurse Practitioner

## 2015-09-28 DIAGNOSIS — I442 Atrioventricular block, complete: Secondary | ICD-10-CM

## 2015-09-28 LAB — CUP PACEART INCLINIC DEVICE CHECK
Battery Remaining Longevity: 124.8
Date Time Interrogation Session: 20170427155542
Implantable Lead Location: 753860
Lead Channel Impedance Value: 600 Ohm
Lead Channel Pacing Threshold Amplitude: 1 V
Lead Channel Pacing Threshold Pulse Width: 0.5 ms
Lead Channel Setting Pacing Amplitude: 1.5 V
Lead Channel Setting Pacing Pulse Width: 0.5 ms
Lead Channel Setting Sensing Sensitivity: 4 mV
MDC IDC LEAD IMPLANT DT: 20170418
MDC IDC LEAD IMPLANT DT: 20170418
MDC IDC LEAD LOCATION: 753859
MDC IDC MSMT BATTERY VOLTAGE: 3.01 V
MDC IDC MSMT LEADCHNL RA PACING THRESHOLD AMPLITUDE: 0.5 V
MDC IDC MSMT LEADCHNL RA PACING THRESHOLD PULSEWIDTH: 0.5 ms
MDC IDC MSMT LEADCHNL RA SENSING INTR AMPL: 5 mV
MDC IDC MSMT LEADCHNL RV IMPEDANCE VALUE: 525 Ohm
MDC IDC PG SERIAL: 7882213
MDC IDC SET LEADCHNL RV PACING AMPLITUDE: 1.125
MDC IDC STAT BRADY RA PERCENT PACED: 17 %
MDC IDC STAT BRADY RV PERCENT PACED: 99.98 %
Pulse Gen Model: 2272

## 2015-09-28 NOTE — Progress Notes (Signed)
Wound check appointment. Wound without redness or edema. Incision edges approximated, wound well healed. Normal device function. Thresholds, sensing, and impedances consistent with implant measurements. Device programmed with auto capture on for extra safety margin until 3 month visit. Histogram distribution appropriate for patient and level of activity. No mode switches or high ventricular rates noted. Patient educated about wound care, arm mobility, lifting restrictions. ROV with WC 12/19/2015.

## 2015-10-02 ENCOUNTER — Other Ambulatory Visit: Payer: Self-pay | Admitting: Family Medicine

## 2015-10-27 ENCOUNTER — Telehealth: Payer: Self-pay | Admitting: Cardiology

## 2015-10-27 NOTE — Telephone Encounter (Signed)
Pt c/o Shortness Of Breath: STAT if SOB developed within the last 24 hours or pt is noticeably SOB on the phone  1. Are you currently SOB (can you hear that pt is SOB on the phone)? no 2. How long have you been experiencing SOB? week 3. Are you SOB when sitting or when up moving around? Up moving around 4. Are you currently experiencing any other symptoms? Light headedness

## 2015-10-27 NOTE — Telephone Encounter (Signed)
Reports SOB w/ activity the last week/two.  States that Sunday morning she woke up light-headed and the ceiling was spinning.  She had to sit on the side of the bed for a minute before standing up.  She does states that she was doing a lot the day before that she hadn't done in awhile and also didn't stay hydrated as well as she normally does.  States that the last 4 days she has been experiencing light-headedness on and off all day. Appointment made to see Dr. Curt Bears to evaluate concerns. Patient verbalized understanding and agreeable to plan.

## 2015-10-31 ENCOUNTER — Encounter: Payer: BLUE CROSS/BLUE SHIELD | Admitting: Cardiology

## 2015-11-12 ENCOUNTER — Other Ambulatory Visit: Payer: Self-pay | Admitting: Nurse Practitioner

## 2015-12-18 NOTE — Progress Notes (Signed)
Electrophysiology Office Note   Date:  12/19/2015   ID:  [name], DOB [birth year], MRN [id number]  PCP:  [name], MD  Primary Electrophysiologist:  Oriya Kettering Meredith Leeds, MD    Chief Complaint  Patient presents with  . Pacemaker Check    3 months post implant     History of Present Illness: [name] is a 65 y.o. female who presents today for electrophysiology evaluation.   Presented to the hospital after being found in complete heart block.  St. Jude dual chamber pacemaker placed. She has had some dizziness that she feels is related to her sinuses. She says that it feels like the room is spinning. There is no association with changing of position.   Today, she denies symptoms of palpitations, chest pain, shortness of breath, orthopnea, PND, lower extremity edema, claudication, presyncope, syncope, bleeding, or neurologic sequela. The patient is tolerating medications without difficulties and is otherwise without complaint today.    Past Medical History  Diagnosis Date  . Allergy   . Hyperlipidemia   . Migraines     Chronic  . Sinusitis   . Acid reflux   . Complete heart block ([place]) 09/2015  . Presence of permanent cardiac pacemaker 09/19/2015   Past Surgical History  Procedure Laterality Date  . Tubal ligation    . Appendectomy    . Cholecystectomy    . Tonsillectomy and adenoidectomy    . Colonoscopy    . Breast enhancement surgery  1985  . Ep implantable device N/A 09/19/2015    Procedure: Pacemaker Implant;  Surgeon: Antanette Richwine Meredith Leeds, MD;  Location: [facility];  Service: Cardiovascular;  Laterality: N/A;     Current Outpatient Prescriptions  Medication Sig Dispense Refill  . alendronate (FOSAMAX) 70 MG tablet TAKE 1 TAB WITH A FULL GLASS OF WATER ON EMPTY STOMACH AND SIT UPRIGHT FOR 30 MINS EVERY 7 DAYS 12 tablet 1  . Biotin 5000 MCG CAPS Take 5,000 mcg by mouth daily.    Marland Kitchen buPROPion (WELLBUTRIN SR) 150 MG 12 hr tablet TAKE 1  TABLET (150 MG TOTAL) BY MOUTH 2 (TWO) TIMES DAILY. AT LEAST 8 HOURS APART 180 tablet 0  . Carboxymethylcellulose Sodium (THERATEARS OP) Place 5 drops into both eyes daily.    . clonazePAM (KLONOPIN) 0.5 MG tablet TAKE 1 TABLET BY MOUTH TWO TIMES DAILY AS NEEDED FOR ANXEITY (Patient taking differently: TAKE 1 TABLET BY MOUTH DAILY AT BEDTIME) 40 tablet 5  . fexofenadine (ALLEGRA) 180 MG tablet Take 180 mg by mouth daily.    . Multiple Vitamin (MULTIVITAMIN WITH MINERALS) TABS tablet Take 1 tablet by mouth daily.    . PSYLLIUM PO Take 6 capsules by mouth at bedtime.    . rizatriptan (MAXALT-MLT) 10 MG disintegrating tablet TAKE ONE TABLET BY MOUTH AT ONSET OF MIGRAINE MAY REPEAT IN 2 HOURS IF NEEDED MAX 2 PER 24 HOURS 18 tablet 5  . topiramate (TOPAMAX) 100 MG tablet TAKE 1 TABLET (100 MG TOTAL) BY MOUTH 2 (TWO) TIMES DAILY. 180 tablet 1  . triamcinolone (NASACORT) 55 MCG/ACT AERO nasal inhaler Place 1 spray into both nostrils 2 (two) times daily.    . vitamin C (ASCORBIC ACID) 500 MG tablet Take 500 mg by mouth 2 (two) times daily.     No current facility-administered medications for this visit.    Allergies:   Beta adrenergic blockers; Ambien; and Biaxin   Social History:  The patient  reports that she has quit smoking.  She quit smokeless tobacco use about 8 years ago. She reports that she drinks alcohol. She reports that she does not use illicit drugs.   Family History:  The patient's family history includes Cancer in her maternal grandfather; Congestive Heart Failure in her mother; Dementia in her mother; Diabetes in her maternal grandmother, mother, paternal grandmother, and sister; Heart attack in her paternal grandmother; Heart disease in her paternal grandmother; Lung cancer in her father; Parkinson's disease in her mother.    ROS:  Please see the history of present illness.   Otherwise, review of systems is positive for none.   All other systems are reviewed and negative.    PHYSICAL  EXAM: VS:  There were no vitals taken for this visit. , BMI There is no weight on file to calculate BMI. GEN: Well nourished, well developed, in no acute distress HEENT: normal Neck: no JVD, carotid bruits, or masses Cardiac: RRR; no murmurs, rubs, or gallops,no edema  Respiratory:  clear to auscultation bilaterally, normal work of breathing GI: soft, nontender, nondistended, + BS MS: no deformity or atrophy Skin: warm and dry,  device pocket is well healed Neuro:  Strength and sensation are intact Psych: euthymic mood, full affect  EKG:  EKG is ordered today. The ekg ordered today shows sinus rhythm, RBBB, diffuse T wave abnormality   Device interrogation is reviewed today in detail.  See PaceArt for details.   Recent Labs: 04/07/2015: ALT 13; TSH 1.350 09/18/2015: B Natriuretic Peptide 250.6* 09/23/2015: BUN 11; Creatinine, Ser 0.71; Hemoglobin 12.6; Platelets 171; Potassium 3.8; Sodium 140    Lipid Panel     Component Value Date/Time   CHOL 214* 01/19/2014 0918   TRIG 67 01/19/2014 0918   HDL 91 01/19/2014 0918   CHOLHDL 2.4 01/19/2014 0918   VLDL 13 01/19/2014 0918   LDLCALC 110* 01/19/2014 0918     Wt Readings from Last 3 Encounters:  09/18/15 152 lb 1.9 oz (69 kg)  09/18/15 150 lb 9.6 oz (68.312 kg)  07/12/15 155 lb (70.308 kg)      Other studies Reviewed: Additional studies/ records that were reviewed today include: Epic notes  ASSESSMENT AND PLAN:  1.  Complete heart block: dual chamber pacemaker placed 09/19/15.  Currently feeling well without major complaint. The device is working appropriately and no changes were made today. She has had a few episodes of atrial tachycardia but it is unclear whether or not these are causing symptoms. We'll continue with remote monitoring.    Current medicines are reviewed at length with the patient today.   The patient does not have concerns regarding her medicines.  The following changes were made today:  none  Labs/  tests ordered today include:  Orders Placed This Encounter  Procedures  . EKG 12-Lead     Disposition:   FU with Brandan Glauber 9 months  Signed, Emillie Chasen Meredith Leeds, MD  12/19/2015 11:40 AM     [facility] [address] [place] [address] [phone] (office) [phone] (fax)

## 2015-12-19 ENCOUNTER — Encounter: Payer: Self-pay | Admitting: Cardiology

## 2015-12-19 ENCOUNTER — Ambulatory Visit (INDEPENDENT_AMBULATORY_CARE_PROVIDER_SITE_OTHER): Payer: BLUE CROSS/BLUE SHIELD | Admitting: Cardiology

## 2015-12-19 DIAGNOSIS — I442 Atrioventricular block, complete: Secondary | ICD-10-CM

## 2015-12-19 NOTE — Patient Instructions (Signed)
Medication Instructions:  Your physician recommends that you continue on your current medications as directed. Please refer to the Current Medication list given to you today.  Labwork: None ordered  Testing/Procedures: None ordered  Follow-Up: Remote monitoring is used to monitor your Pacemaker of ICD from home. This monitoring reduces the number of office visits required to check your device to one time per year. It allows [place] to keep an eye on the functioning of your device to ensure it is working properly. You are scheduled for a device check from home on 03/19/2016. You may send your transmission at any time that day. If you have a wireless device, the transmission will be sent automatically. After your physician reviews your transmission, you will receive a postcard with your next transmission date.  Your physician wants you to follow-up in: 9 months with Dr. Curt Bears. You will receive a reminder letter in the mail two months in advance. If you don't receive a letter, please call our office to schedule the follow-up appointment.  If you need a refill on your cardiac medications before your next appointment, please call your pharmacy.  Thank you for choosing [facility]!!   Trinidad Curet, RN [phone]

## 2016-01-03 ENCOUNTER — Other Ambulatory Visit: Payer: Self-pay | Admitting: Family Medicine

## 2016-01-12 ENCOUNTER — Emergency Department (HOSPITAL_COMMUNITY)
Admission: EM | Admit: 2016-01-12 | Discharge: 2016-01-12 | Disposition: A | Discharge status: Home / Self Care | Payer: BLUE CROSS/BLUE SHIELD | Attending: Emergency Medicine | Admitting: Emergency Medicine

## 2016-01-12 ENCOUNTER — Emergency Department (HOSPITAL_COMMUNITY): Payer: BLUE CROSS/BLUE SHIELD

## 2016-01-12 ENCOUNTER — Encounter (HOSPITAL_COMMUNITY): Payer: Self-pay | Admitting: Emergency Medicine

## 2016-01-12 ENCOUNTER — Other Ambulatory Visit: Payer: Self-pay | Admitting: Physician Assistant

## 2016-01-12 DIAGNOSIS — T82198A Other mechanical complication of other cardiac electronic device, initial encounter: Secondary | ICD-10-CM | POA: Diagnosis not present

## 2016-01-12 DIAGNOSIS — T82119A Breakdown (mechanical) of unspecified cardiac electronic device, initial encounter: Secondary | ICD-10-CM

## 2016-01-12 DIAGNOSIS — R0602 Shortness of breath: Secondary | ICD-10-CM

## 2016-01-12 DIAGNOSIS — I442 Atrioventricular block, complete: Secondary | ICD-10-CM | POA: Diagnosis not present

## 2016-01-12 DIAGNOSIS — I4719 Other supraventricular tachycardia: Secondary | ICD-10-CM

## 2016-01-12 DIAGNOSIS — R0600 Dyspnea, unspecified: Secondary | ICD-10-CM

## 2016-01-12 DIAGNOSIS — I471 Supraventricular tachycardia: Secondary | ICD-10-CM

## 2016-01-12 DIAGNOSIS — R5383 Other fatigue: Secondary | ICD-10-CM

## 2016-01-12 DIAGNOSIS — R52 Pain, unspecified: Secondary | ICD-10-CM

## 2016-01-12 DIAGNOSIS — Y712 Prosthetic and other implants, materials and accessory cardiovascular devices associated with adverse incidents: Secondary | ICD-10-CM | POA: Diagnosis not present

## 2016-01-12 DIAGNOSIS — R072 Precordial pain: Secondary | ICD-10-CM | POA: Diagnosis present

## 2016-01-12 DIAGNOSIS — Z950 Presence of cardiac pacemaker: Secondary | ICD-10-CM

## 2016-01-12 DIAGNOSIS — R0789 Other chest pain: Secondary | ICD-10-CM

## 2016-01-12 DIAGNOSIS — Z87891 Personal history of nicotine dependence: Secondary | ICD-10-CM | POA: Diagnosis not present

## 2016-01-12 DIAGNOSIS — T82111A Breakdown (mechanical) of cardiac pulse generator (battery), initial encounter: Secondary | ICD-10-CM

## 2016-01-12 HISTORY — DX: Personal history of nicotine dependence: Z87.891

## 2016-01-12 HISTORY — DX: Other supraventricular tachycardia: I47.19

## 2016-01-12 HISTORY — DX: Supraventricular tachycardia: I47.1

## 2016-01-12 LAB — CBC
HCT: 44.5 % (ref 36.0–46.0)
Hemoglobin: 14.5 g/dL (ref 12.0–15.0)
MCH: 31.3 pg (ref 26.0–34.0)
MCHC: 32.6 g/dL (ref 30.0–36.0)
MCV: 95.9 fL (ref 78.0–100.0)
PLATELETS: 217 10*3/uL (ref 150–400)
RBC: 4.64 MIL/uL (ref 3.87–5.11)
RDW: 14.1 % (ref 11.5–15.5)
WBC: 5.8 10*3/uL (ref 4.0–10.5)

## 2016-01-12 LAB — BASIC METABOLIC PANEL
Anion gap: 9 (ref 5–15)
BUN: 17 mg/dL (ref 6–20)
CALCIUM: 9.3 mg/dL (ref 8.9–10.3)
CHLORIDE: 108 mmol/L (ref 101–111)
CO2: 25 mmol/L (ref 22–32)
CREATININE: 0.87 mg/dL (ref 0.44–1.00)
GFR calc non Af Amer: >60 mL/min (ref >60)
Glucose, Bld: 92 mg/dL (ref 65–99)
Potassium: 4 mmol/L (ref 3.5–5.1)
SODIUM: 142 mmol/L (ref 135–145)

## 2016-01-12 LAB — T4, FREE: FREE T4: 1.12 ng/dL (ref 0.61–1.12)

## 2016-01-12 LAB — TSH: TSH: 1.193 u[IU]/mL (ref 0.350–4.500)

## 2016-01-12 LAB — BRAIN NATRIURETIC PEPTIDE: B Natriuretic Peptide: 25.8 pg/mL (ref 0.0–100.0)

## 2016-01-12 LAB — I-STAT TROPONIN, ED: Troponin i, poc: 0 ng/mL (ref 0.00–0.08)

## 2016-01-12 LAB — MAGNESIUM: Magnesium: 2.3 mg/dL (ref 1.7–2.4)

## 2016-01-12 NOTE — ED Notes (Signed)
[place] [id number]

## 2016-01-12 NOTE — ED Provider Notes (Signed)
[facility] DEPT Provider Note   CSN: [id number] Arrival date & time: 01/12/16  [id number]  First Provider Contact:  None       History   Chief Complaint Chief Complaint  Patient presents with  . Chest Pain  . Shortness of Breath  . Dizziness    HPI [name] is a 65 y.o. female.  HPI 65 year old female with past medical history of complete heart block status post pacemaker placement who presents with a one-day history of palpitations, chest pain, shortness of breath. The patient states that she has been recovering fairly well since her recent pacemaker placement. However, earlier today the patient noticed sensation of strong palpitations followed by lightheadedness. She states that she feels her heart beat irregularly, and then she gets lightheaded. She has had some mild, dull, substernal chest pressure associated with this. The symptoms feel similar to her previous heart block, so she presents for evaluation. Denies any pain or redness at her pacemaker site. Denies any recent fevers or chills. She has had some general fatigue. No abdominal pain, nausea or vomiting  Past Medical History:  Diagnosis Date  . Acid reflux   . Allergy   . Complete heart block ([place]) 09/2015   a. s/p St. Jude PPM 09/2015.  Marland Kitchen Former tobacco use   . Hyperlipidemia   . Migraines    Chronic  . PAT (paroxysmal atrial tachycardia) ([place])   . Presence of permanent cardiac pacemaker 09/19/2015  . Sinusitis     Patient Active Problem List   Diagnosis Date Noted  . Fatigue 01/12/2016  . Dyspnea 01/12/2016  . Chest tightness 01/12/2016  . PAT (paroxysmal atrial tachycardia) ([place]) 01/12/2016  . S/P placement of cardiac pacemaker 01/12/2016  . Complete heart block ([place]) 09/18/2015  . Constipation 07/14/2015  . Urticaria 07/14/2015  . Insomnia 04/10/2015  . Depression with anxiety 04/10/2015  . Osteoporosis 01/27/2014  . Pain in joint, lower leg 08/30/2013  . Migraines 01/21/2013    Past  Surgical History:  Procedure Laterality Date  . APPENDECTOMY    . BREAST ENHANCEMENT SURGERY  1985  . CHOLECYSTECTOMY    . COLONOSCOPY    . EP IMPLANTABLE DEVICE N/A 09/19/2015   Procedure: Pacemaker Implant;  Surgeon: Will Meredith Leeds, MD;  Location: [facility];  Service: Cardiovascular;  Laterality: N/A;  . TONSILLECTOMY AND ADENOIDECTOMY    . TUBAL LIGATION      OB History    No data available       Home Medications    Prior to Admission medications   Medication Sig Start Date End Date Taking? Authorizing Provider  alendronate (FOSAMAX) 70 MG tablet TAKE 1 TAB WITH A FULL GLASS OF WATER ON EMPTY STOMACH AND SIT UPRIGHT FOR 30 MINS EVERY 7 DAYS 09/29/15  Yes Nilda Simmer, NP  Biotin 5000 MCG CAPS Take 5,000 mcg by mouth daily.   Yes Historical Provider, MD  buPROPion (WELLBUTRIN SR) 150 MG 12 hr tablet TAKE 1 TABLET (150 MG TOTAL) BY MOUTH 2 (TWO) TIMES DAILY. AT LEAST 8 HOURS APART 01/03/16  Yes Kathyrn Drown, MD  Carboxymethylcellulose Sodium (THERATEARS OP) Place 5 drops into both eyes daily.   Yes Historical Provider, MD  clonazePAM (KLONOPIN) 0.5 MG tablet TAKE 1 TABLET BY MOUTH TWO TIMES DAILY AS NEEDED FOR ANXEITY Patient taking differently: TAKE 1 TABLET BY MOUTH DAILY AT BEDTIME 08/21/15  Yes Nilda Simmer, NP  fexofenadine (ALLEGRA) 180 MG tablet Take 180 mg by mouth daily.  Yes Historical Provider, MD  Multiple Vitamin (MULTIVITAMIN WITH MINERALS) TABS tablet Take 1 tablet by mouth daily.   Yes Historical Provider, MD  PSYLLIUM PO Take 6 capsules by mouth at bedtime.   Yes Historical Provider, MD  rizatriptan (MAXALT-MLT) 10 MG disintegrating tablet TAKE ONE TABLET BY MOUTH AT ONSET OF MIGRAINE MAY REPEAT IN 2 HOURS IF NEEDED MAX 2 PER 24 HOURS 09/01/15  Yes Mikey Kirschner, MD  topiramate (TOPAMAX) 100 MG tablet TAKE 1 TABLET (100 MG TOTAL) BY MOUTH 2 (TWO) TIMES DAILY. 11/13/15  Yes Nilda Simmer, NP  triamcinolone (NASACORT) 55 MCG/ACT AERO nasal  inhaler Place 1 spray into both nostrils 2 (two) times daily.   Yes Historical Provider, MD  vitamin C (ASCORBIC ACID) 500 MG tablet Take 500 mg by mouth 2 (two) times daily.   Yes Historical Provider, MD    Family History Family History  Problem Relation Age of Onset  . Congestive Heart Failure Mother   . Diabetes Mother   . Dementia Mother   . Parkinson's disease Mother   . Lung cancer Father   . Diabetes Sister   . Diabetes Maternal Grandmother   . Cancer Maternal Grandfather   . Diabetes Paternal Grandmother   . Heart attack Paternal Grandmother   . Heart disease Paternal Grandmother     Social History Social History  Substance Use Topics  . Smoking status: Former Research scientist (life sciences)  . Smokeless tobacco: Former Systems developer    Quit date: 01/06/2007  . Alcohol use No     Allergies   Beta adrenergic blockers; Ambien [zolpidem tartrate]; and Biaxin [clarithromycin]   Review of Systems Review of Systems  Constitutional: Positive for fatigue. Negative for chills and fever.  HENT: Negative for congestion and rhinorrhea.   Eyes: Negative for visual disturbance.  Respiratory: Positive for chest tightness and shortness of breath. Negative for cough and wheezing.   Cardiovascular: Positive for chest pain and palpitations. Negative for leg swelling.  Gastrointestinal: Negative for abdominal pain, diarrhea, nausea and vomiting.  Genitourinary: Negative for dysuria and flank pain.  Musculoskeletal: Negative for neck pain and neck stiffness.  Skin: Negative for rash and wound.  Allergic/Immunologic: Negative for immunocompromised state.  Neurological: Negative for syncope, weakness, light-headedness and headaches.  All other systems reviewed and are negative.    Physical Exam Updated Vital Signs BP 101/60   Pulse 61   Temp 97.9 F (36.6 C) (Oral)   Resp 17   Ht 5' 4.5" (1.638 m)   Wt 148 lb (67.1 kg)   SpO2 99%   BMI 25.01 kg/m   Physical Exam  Constitutional: She is oriented to  person, place, and time. She appears well-developed and well-nourished. No distress.  HENT:  Head: Normocephalic and atraumatic.  Mouth/Throat: Oropharynx is clear and moist.  Eyes: Conjunctivae are normal. Pupils are equal, round, and reactive to light.  Neck: Neck supple.  Cardiovascular: Normal rate and normal heart sounds.  An irregular rhythm present.  Extrasystoles are present. Exam reveals no friction rub.   No murmur heard. Pulmonary/Chest: Effort normal and breath sounds normal. No respiratory distress. She has no wheezes. She has no rales.  Abdominal: She exhibits no distension. There is no tenderness.  Musculoskeletal: She exhibits no edema.  Neurological: She is alert and oriented to person, place, and time. She exhibits normal muscle tone.  Skin: Skin is warm. Capillary refill takes less than 2 seconds.  Nursing note and vitals reviewed.    ED Treatments / Results  Labs (all labs ordered are listed, but only abnormal results are displayed) Labs Reviewed  CULTURE, BLOOD (ROUTINE X 2)  CULTURE, BLOOD (ROUTINE X 2)  BASIC METABOLIC PANEL  CBC  BRAIN NATRIURETIC PEPTIDE  MAGNESIUM  TSH  T4, FREE  I-STAT TROPOININ, ED    EKG  EKG Interpretation  Date/Time:  Friday January 12 2016 09:46:46 EDT Ventricular Rate:  64 PR Interval:    QRS Duration: 125 QT Interval:  445 QTC Calculation: 460 R Axis:   91 Text Interpretation:  Sinus rhythm RBBB and LPFB When compared to previous Patient is now in sinus rhythm, no pacing Confirmed by Jacqueline Heckmann MD, Shardea Cwynar [phone]) on 01/12/2016 1:12:03 PM       Radiology Dg Chest Port 1 View  Result Date: 01/12/2016 CLINICAL DATA:  65 year old female with a history of chest tightness and rapid heart rate EXAM: PORTABLE CHEST 1 VIEW COMPARISON:  09/20/2015 FINDINGS: Cardiomediastinal silhouette unchanged in size and contour. Unchanged position of left chest wall pacing device with 2 leads in place. No confluent airspace disease,  pneumothorax, or pleural effusion. IMPRESSION: No radiographic evidence of acute cardiopulmonary disease. Signed, Dulcy Fanny. Earleen Newport, DO [facility] Radiology Electronically Signed   By: Corrie Mckusick D.O.   On: 01/12/2016 14:07    Procedures Procedures (including critical care time)  Medications Ordered in ED Medications - No data to display   Initial Impression / Assessment and Plan / ED Course  I have reviewed the triage vital signs and the nursing notes.  Pertinent labs & imaging results that were available during my care of the patient were reviewed by me and considered in my medical decision making (see chart for details).  Clinical Course   65 yo F with PMHx of third degree HB s/p PM placement who presents with symptomatic palpitations, fatigue, and lightheadedness. See HPI above. On arrival, VSS with normal BP but pt noted to have irregular HR with ? PVCs versus V-Paced beats. She also appears to have a new, underlying intrinsic sinus rhythm which has not been previously present. I suspect her sx are 2/2 PM dysfunction versus symptomatic PVCs. Will consult Cardiology for evaluation. Otherwise, lab work obtained and is unremarkable. Lytes acceptable. No signs of ischemia.  Cardiology, Dr. Caryl Comes, has personally evaluated pt in ED. PM interrogated and settings adjusted by representative in ED. Pt is now asymptomatic. Per discussion with Cardiology, pt's sx were due to overcoming of PM by intrinsic SR with persistent paced beats leading to inadequate filling. This has now been corrected. Pt ambulatory without difficulty. Per Cardiology and discussion with family, will d/c with return precautions, outpt follow-up for further work-up  Final Clinical Impressions(s) / ED Diagnoses   Final diagnoses:  Pain  Pacemaker malfunction, initial encounter    New Prescriptions Discharge Medication List as of 01/12/2016  2:57 PM       Duffy Bruce,  MD 01/13/16 1204

## 2016-01-12 NOTE — H&P (Deleted)
See consult note

## 2016-01-12 NOTE — ED Notes (Signed)
Pacemaker interrogated by Aretta Nip, RN Charge.  Report given to Dr,. Bland Span.

## 2016-01-12 NOTE — Consult Note (Signed)
Cardiology History & Physical    Patient ID: [name] MRN: [id number], DOB: Feb 21, 1951 Date of Encounter: 01/12/2016, 1:20 PM Primary Physician: [name], MD Primary Cardiologist: Dr. Curt Bears  Chief Complaint: SOB, chest tightness Reason for Admission: above Requesting MD: Dr. Ellender Hose  HPI: Ms. [name] is a 65 y/o F with history of former tobacco abuse, HLD, migraines, acid reflux, complete heart block 09/2015 s/p St. Jude PPM 09/2015, atrial tachycardia by PPM interrogation who presented to [facility] today with paroxysmal dyspnea and chest tightness.  Per review of chart, remote nuc report 2004 reports ST depression with exercise but normal perfusion. She has no history of cath or echo. She presented in 09/2015 with syncope and HR 30's/CHB and underwent uneventful PPM placement. She reports fatigue and dyspnea prior to the pacemaker that improved s/p ppm. Last month she saw Dr. Curt Bears and was noting intermittent dizziness. Pacer revealed runs of atrial tach but unclear if any correlation with symptoms.  For the past week she has noticed resurgence of heavy fatigue, so much that she has felt like she needs to pull over to the side of the road because she's worried about falling asleep. She has also noticed that when walking to her office yesterday she felt her heart pounding more prominently in her throat than usual. This resolved with rest. She planned to call her PCP today to discuss possible sleep study due to profound fatigue. This morning while walking to her office she noticed dyspnea, chest tightness, and that same sensation of elevated HR. She subsequently proceeded to the hospital for further workup. Since being in the ER she's had brief recurrent episodes of these symptoms lasting brief periods of time without apparent trigger. She also reports a h/o intermittent dizziness, sometimes when she goes to lie down or sometimes when she looks upwards. There are other times when it's  completely random. Workup notable for normal BMET (K 4), trop neg, BNP normal, CBC normal. CXR pending. VSS - not tachycardic, tachypneic or hypoxic. Telemetry notable for intermittent widening of her QRS still in line with same sinus rate, with apparent P waves that seem to dissociate from the QRS complex. The EDP noticed a clear association between this rhythm and an episode of dyspnea/tightness she was having. CXR and pacer interrogation are pending. No fevers, chills, LEE, orthopnea, PND or syncope. She is not tachycardic, tachypneic or hypoxic.  Past Medical History:  Diagnosis Date  . Acid reflux   . Allergy   . Complete heart block ([place]) 09/2015   a. s/p St. Jude PPM 09/2015.  Marland Kitchen Former tobacco use   . Hyperlipidemia   . Migraines    Chronic  . PAT (paroxysmal atrial tachycardia) ([place])   . Presence of permanent cardiac pacemaker 09/19/2015  . Sinusitis      Surgical History:  Past Surgical History:  Procedure Laterality Date  . APPENDECTOMY    . BREAST ENHANCEMENT SURGERY  1985  . CHOLECYSTECTOMY    . COLONOSCOPY    . EP IMPLANTABLE DEVICE N/A 09/19/2015   Procedure: Pacemaker Implant;  Surgeon: Will Meredith Leeds, MD;  Location: [facility];  Service: Cardiovascular;  Laterality: N/A;  . TONSILLECTOMY AND ADENOIDECTOMY    . TUBAL LIGATION       Home Meds: Prior to Admission medications   Medication Sig Start Date End Date Taking? Authorizing Provider  alendronate (FOSAMAX) 70 MG tablet TAKE 1 TAB WITH A FULL GLASS OF WATER ON EMPTY STOMACH AND SIT UPRIGHT  FOR 30 MINS EVERY 7 DAYS 09/29/15  Yes Nilda Simmer, NP  Biotin 5000 MCG CAPS Take 5,000 mcg by mouth daily.   Yes Historical Provider, MD  buPROPion (WELLBUTRIN SR) 150 MG 12 hr tablet TAKE 1 TABLET (150 MG TOTAL) BY MOUTH 2 (TWO) TIMES DAILY. AT LEAST 8 HOURS APART 01/03/16  Yes Kathyrn Drown, MD  Carboxymethylcellulose Sodium (THERATEARS OP) Place 5 drops into both eyes daily.   Yes Historical Provider, MD    clonazePAM (KLONOPIN) 0.5 MG tablet TAKE 1 TABLET BY MOUTH TWO TIMES DAILY AS NEEDED FOR ANXEITY Patient taking differently: TAKE 1 TABLET BY MOUTH DAILY AT BEDTIME 08/21/15  Yes Nilda Simmer, NP  fexofenadine (ALLEGRA) 180 MG tablet Take 180 mg by mouth daily.   Yes Historical Provider, MD  Multiple Vitamin (MULTIVITAMIN WITH MINERALS) TABS tablet Take 1 tablet by mouth daily.   Yes Historical Provider, MD  PSYLLIUM PO Take 6 capsules by mouth at bedtime.   Yes Historical Provider, MD  rizatriptan (MAXALT-MLT) 10 MG disintegrating tablet TAKE ONE TABLET BY MOUTH AT ONSET OF MIGRAINE MAY REPEAT IN 2 HOURS IF NEEDED MAX 2 PER 24 HOURS 09/01/15  Yes Mikey Kirschner, MD  topiramate (TOPAMAX) 100 MG tablet TAKE 1 TABLET (100 MG TOTAL) BY MOUTH 2 (TWO) TIMES DAILY. 11/13/15  Yes Nilda Simmer, NP  triamcinolone (NASACORT) 55 MCG/ACT AERO nasal inhaler Place 1 spray into both nostrils 2 (two) times daily.   Yes Historical Provider, MD  vitamin C (ASCORBIC ACID) 500 MG tablet Take 500 mg by mouth 2 (two) times daily.   Yes Historical Provider, MD    Allergies:  Allergies  Allergen Reactions  . Beta Adrenergic Blockers Shortness Of Breath  . Ambien [Zolpidem Tartrate] Other (See Comments)    Hallucinations  . Biaxin [Clarithromycin] Hives    May have caused hives see 06/19/15    Social History   Social History  . Marital status: Married    Spouse name: N/A  . Number of children: N/A  . Years of education: N/A   Occupational History  . Not on file.   Social History Main Topics  . Smoking status: Former Research scientist (life sciences)  . Smokeless tobacco: Former Systems developer    Quit date: 01/06/2007  . Alcohol use No  . Drug use: No  . Sexual activity: Not on file   Other Topics Concern  . Not on file   Social History Narrative  . No narrative on file     Family History  Problem Relation Age of Onset  . Congestive Heart Failure Mother   . Diabetes Mother   . Dementia Mother   . Parkinson's disease  Mother   . Lung cancer Father   . Diabetes Sister   . Diabetes Maternal Grandmother   . Cancer Maternal Grandfather   . Diabetes Paternal Grandmother   . Heart attack Paternal Grandmother   . Heart disease Paternal Grandmother     Review of Systems: All other systems reviewed and are otherwise negative except as noted above.  Labs:   Lab Results  Component Value Date   WBC 5.8 01/12/2016   HGB 14.5 01/12/2016   HCT 44.5 01/12/2016   MCV 95.9 01/12/2016   PLT 217 01/12/2016     Recent Labs Lab 01/12/16 0956  NA 142  K 4.0  CL 108  CO2 25  BUN 17  CREATININE 0.87  CALCIUM 9.3  GLUCOSE 92   No results for input(s): CKTOTAL, CKMB, TROPONINI  in the last 72 hours. Lab Results  Component Value Date   CHOL 214 (H) 01/19/2014   HDL 91 01/19/2014   LDLCALC 110 (H) 01/19/2014   TRIG 67 01/19/2014   No results found for: DDIMER  Radiology/Studies:  No results found. Wt Readings from Last 3 Encounters:  01/12/16 148 lb (67.1 kg)  09/18/15 152 lb 1.9 oz (69 kg)  09/18/15 150 lb 9.6 oz (68.3 kg)    EKG: NSR 64bpm RBBB LAFB  Physical Exam: Blood pressure (!) 100/51, pulse 64, temperature 97.8 F (36.6 C), temperature source Oral, resp. rate 20, height 5' 4.5" (1.638 m), weight 148 lb (67.1 kg), SpO2 100 %. Body mass index is 25.01 kg/m. General: Well developed, well nourished WF in no acute distress. Lying flat in bed  Head: Normocephalic, atraumatic, sclera non-icteric, no xanthomas, nares are without discharge.  Neck: Negative for carotid bruits. JVD not elevated. Lungs: Clear bilaterally to auscultation without wheezes, rales, or rhonchi. Breathing is unlabored. Heart: RRR with S1 S2. No murmurs, rubs, or gallops appreciated. Pacemaker site without any redness or swelling Abdomen: Soft, non-tender, non-distended with normoactive bowel sounds. No hepatomegaly. No rebound/guarding. No obvious abdominal masses. Msk:  Strength and tone appear normal for  age. Extremities: No clubbing or cyanosis. No edema.  Distal pedal pulses are 2+ and equal bilaterally. Neuro: Alert and oriented X 3. No focal deficit. No facial asymmetry. Moves all extremities spontaneously. Psych:  Responds to questions appropriately with a normal affect.    Assessment and Plan   1. Fatigue, dyspnea, chest discomfort - await pacer interrogation and CXR before determining further plans. The EDP witnessed an association between wider complex paced rhythm and her dyspnea/chest tightness. She does need structural eval of her heart with an echo at some point. Fatigue is constant therefore I am not sure if it is related to any rhythm issues - will need updating thyroid function.  2. Dizziness - some features sound vertiginous in nature. This did not change with pacemaker. She has history of chronic sinus issues and may benefit from seeing ENT for attempt at Epley maneuver.  3. CHB s/p St. Jude PPM 09/2015 - pending interrogation.  4. H/o atrial tach - she had an episode of SOB while I was in the room and telemetry did not show any tachycardia at that time. Unclear if there is relationship of this to her dizziness. Follow.  Signed, Dayna N Dunn PA-C 01/12/2016, 1:20 PM Pager: [phone]  Addendum: pacemaker rep came down and was able to reproduce similar symptoms as noticed above with changing different pacemaker algorithms. See Dr. Olin Pia note for his interpretation of what issue is occurring here. Per our discussion will send thyroid function and blood cultures (to r/o infection causing her fatigue), and schedule for outpt echo and EP f/u. Echo is scheduled for 8/22, and I sent a message to EP scheduler to arrange EP f/u. Office will call her with this appt. Will also f/u CXR result before she leaves. She was also made aware to contact our office if CP/SOB recurs. Melina Copa PA-C  Patient seen and examined. There are overriding issues. 1-abrupt onset of significant fatigue over  the last 10 days. #2-palpitations with exertion #3-lightheadedness.  Many of her symptoms are potentially explained by his pacemaker programming peculiarities.  In this context it is notable that her complete heart block has resolved. She has normal sinus rhythm with intrinsic conduction. Heart rate excursion appears adequate;  measured rapid rates appear to be related  to functional atrial under sensingn with atrial pacing and non-capture as well as  overzealous atrial pacing. Rate response has been inactivated capture management has been inactivated although P Eugenio Hoes been inactivated and the device has been programmed DDD at a lower rate of 50  Have to see how her symptoms persist despite this.  As noted above, I'm concerned about the fatigue as a potential manifestation of device infection for which there are no obvious signs. We will obtain blood cultures.  We will also obtain an echocardiogram as this was not obtained prior to device implantation

## 2016-01-12 NOTE — Progress Notes (Signed)
   Instructions given to patient:  - 2D echo scheduled on Tuesday August 22nd at 9:30am at [facility] - please register at 9:15am at the main entrance of [facility]. - The office will call you with information about your follow-up appointment. - If symptoms recur or do not resolve, please call your cardiologist.

## 2016-01-12 NOTE — ED Triage Notes (Signed)
Pt c/o chest pain with shortness of breath onset while walking up stairs at work. Pt reports similar episode of same while walking up stairs. Pt reports dizziness for past week. Pt had a pacemaker placed in April for 3rd degree heart block.  Pt took 325 mg of ASA and 1 nitro PTA.

## 2016-01-15 ENCOUNTER — Telehealth: Payer: Self-pay | Admitting: Cardiology

## 2016-01-15 NOTE — Telephone Encounter (Signed)
New Message  Pt verbalized she was in ED on 8.11 forgot to get doctors not.  Pt verbalized she returns to work on Wednesday and would like the note posted on mychart or for someone to contact her.  Please follow up with pt. Thanks!

## 2016-01-15 NOTE — Telephone Encounter (Signed)
Left message on machine for pt to contact the office. Pt was seen in the ER 01/12/16.  Need to clarify if the pt missed additional days other than 01/12/16.

## 2016-01-16 NOTE — Telephone Encounter (Signed)
If asymptomatic, can return to work.

## 2016-01-16 NOTE — Telephone Encounter (Signed)
I spoke with the pt and she needs a note stating that she can return to work on 01/17/2016 and if she has any restrictions. The pt was treated in the ER on 01/12/16. The pt would like note faxed to Hospice, Attn: Marylouise Stacks, [phone].

## 2016-01-16 NOTE — Telephone Encounter (Signed)
Follow Up: ° ° ° °Returning your call from yesterday. °

## 2016-01-16 NOTE — Telephone Encounter (Signed)
When I spoke with the pt this morning she was doing well. Letter completed and I will fax to employer per pt's request.

## 2016-01-17 LAB — CULTURE, BLOOD (ROUTINE X 2)
CULTURE: NO GROWTH
Culture: NO GROWTH

## 2016-01-22 ENCOUNTER — Encounter: Payer: Self-pay | Admitting: Family Medicine

## 2016-01-23 ENCOUNTER — Ambulatory Visit (HOSPITAL_COMMUNITY)
Admission: RE | Admit: 2016-01-23 | Discharge: 2016-01-23 | Disposition: A | Discharge status: Home / Self Care | Payer: BLUE CROSS/BLUE SHIELD | Source: Ambulatory Visit | Attending: Physician Assistant | Admitting: Physician Assistant

## 2016-01-23 DIAGNOSIS — I442 Atrioventricular block, complete: Secondary | ICD-10-CM | POA: Diagnosis not present

## 2016-01-23 DIAGNOSIS — R0602 Shortness of breath: Secondary | ICD-10-CM | POA: Diagnosis not present

## 2016-01-23 DIAGNOSIS — I071 Rheumatic tricuspid insufficiency: Secondary | ICD-10-CM | POA: Insufficient documentation

## 2016-01-23 DIAGNOSIS — I5189 Other ill-defined heart diseases: Secondary | ICD-10-CM | POA: Diagnosis not present

## 2016-01-23 DIAGNOSIS — I358 Other nonrheumatic aortic valve disorders: Secondary | ICD-10-CM | POA: Diagnosis not present

## 2016-01-23 DIAGNOSIS — R0600 Dyspnea, unspecified: Secondary | ICD-10-CM | POA: Diagnosis present

## 2016-01-23 NOTE — Progress Notes (Signed)
*  PRELIMINARY RESULTS* Echocardiogram 2D Echocardiogram has been performed.  [name] 01/23/2016, 10:08 AM

## 2016-01-26 ENCOUNTER — Encounter: Payer: Self-pay | Admitting: Cardiology

## 2016-01-28 NOTE — Progress Notes (Signed)
Electrophysiology Office Note   Date:  01/29/2016   ID:  [name], DOB 1950-10-07, MRN [id number]  PCP:  Jacqueline Lange, MD  Primary Electrophysiologist:  Jacqueline Inscore Meredith Leeds, MD    Chief Complaint  Patient presents with  . Pacemaker Check  . Shortness of Breath  . Fatigue     History of Present Illness: [name] is a 65 y.o. female who presents today for electrophysiology evaluation.   Presented to the hospital after being found in complete heart block.  St. Jude dual chamber pacemaker placed. Presented to the hospital with fatigue and had her pacemaker settings changed. She returns to clinic today with continued symptoms of shortness of breath and fatigue. When she presented to the hospital, she had extreme shortness of breath when walking into work. Since that time, she has continued to have episodic fatigue and shortness of breath throughout the day. She says that there is no pattern to this and no exacerbating or alleviating factors. She also says that she has been dizzy, but has not felt like she would pass out.   Today, she denies symptoms of chest pain, shortness of breath, orthopnea, PND, lower extremity edema, claudication, presyncope, syncope, bleeding, or neurologic sequela. The patient is tolerating medications without difficulties and is otherwise without complaint today.    Past Medical History:  Diagnosis Date  . Acid reflux   . Allergy   . Complete heart block ([place]) 09/2015   a. s/p St. Jude PPM 09/2015.  Marland Kitchen Former tobacco use   . Hyperlipidemia   . Migraines    Chronic  . PAT (paroxysmal atrial tachycardia) ([place])   . Presence of permanent cardiac pacemaker 09/19/2015  . Sinusitis    Past Surgical History:  Procedure Laterality Date  . APPENDECTOMY    . BREAST ENHANCEMENT SURGERY  1985  . CHOLECYSTECTOMY    . COLONOSCOPY    . EP IMPLANTABLE DEVICE N/A 09/19/2015   Procedure: Pacemaker Implant;  Surgeon: [name] Meredith Leeds, MD;  Location:  [facility];  Service: Cardiovascular;  Laterality: N/A;  . TONSILLECTOMY AND ADENOIDECTOMY    . TUBAL LIGATION       Current Outpatient Prescriptions  Medication Sig Dispense Refill  . alendronate (FOSAMAX) 70 MG tablet TAKE 1 TAB WITH A FULL GLASS OF WATER ON EMPTY STOMACH AND SIT UPRIGHT FOR 30 MINS EVERY 7 DAYS 12 tablet 1  . Biotin 5000 MCG CAPS Take 5,000 mcg by mouth daily.    Marland Kitchen buPROPion (WELLBUTRIN SR) 150 MG 12 hr tablet TAKE 1 TABLET (150 MG TOTAL) BY MOUTH 2 (TWO) TIMES DAILY. AT LEAST 8 HOURS APART 60 tablet 0  . Carboxymethylcellulose Sodium (THERATEARS OP) Place 5 drops into both eyes daily.    . clonazePAM (KLONOPIN) 0.5 MG tablet TAKE 1 TABLET BY MOUTH TWO TIMES DAILY AS NEEDED FOR ANXEITY (Patient taking differently: TAKE 1 TABLET BY MOUTH DAILY AT BEDTIME) 40 tablet 5  . fexofenadine (ALLEGRA) 180 MG tablet Take 180 mg by mouth daily.    . Multiple Vitamin (MULTIVITAMIN WITH MINERALS) TABS tablet Take 1 tablet by mouth daily.    . PSYLLIUM PO Take 6 capsules by mouth at bedtime.    . rizatriptan (MAXALT-MLT) 10 MG disintegrating tablet TAKE ONE TABLET BY MOUTH AT ONSET OF MIGRAINE MAY REPEAT IN 2 HOURS IF NEEDED MAX 2 PER 24 HOURS 18 tablet 5  . topiramate (TOPAMAX) 100 MG tablet TAKE 1 TABLET (100 MG TOTAL) BY MOUTH 2 (TWO) TIMES  DAILY. 180 tablet 1  . vitamin C (ASCORBIC ACID) 500 MG tablet Take 500 mg by mouth 2 (two) times daily.     No current facility-administered medications for this visit.     Allergies:   Beta adrenergic blockers; Ambien [zolpidem tartrate]; and Biaxin [clarithromycin]   Social History:  The patient  reports that she has quit smoking. She quit smokeless tobacco use about 9 years ago. She reports that she does not drink alcohol or use drugs.   Family History:  The patient's family history includes Cancer in her maternal grandfather; Congestive Heart Failure in her mother; Dementia in her mother; Diabetes in her maternal grandmother,  mother, paternal grandmother, and sister; Heart attack in her paternal grandmother; Heart disease in her paternal grandmother; Lung cancer in her father; Parkinson's disease in her mother.    ROS:  Please see the history of present illness.   Otherwise, review of systems is positive for palpitatins, chest pressure, fatigue, DOE, joint swelling, dizziness, easy bruising.   All other systems are reviewed and negative.    PHYSICAL EXAM: VS:  BP 110/66   Pulse 78   Ht 5' 4.5" (1.638 m)   Wt 152 lb 3.2 oz (69 kg)   BMI 25.72 kg/m  , BMI Body mass index is 25.72 kg/m. GEN: Well nourished, well developed, in no acute distress  HEENT: normal  Neck: no JVD, carotid bruits, or masses Cardiac: RRR; no murmurs, rubs, or gallops,no edema  Respiratory:  clear to auscultation bilaterally, normal work of breathing GI: soft, nontender, nondistended, + BS MS: no deformity or atrophy  Skin: warm and dry,  device pocket is well healed Neuro:  Strength and sensation are intact Psych: euthymic mood, full affect  EKG:  EKG is not ordered today. Personal review of the ekg ordered 01/12/16 shows sinus rhythm, RBBB, LPFB   Device interrogation is personally reviewed today in detail.  See PaceArt for details.   Recent Labs: 04/07/2015: ALT 13 01/12/2016: B Natriuretic Peptide 25.8; BUN 17; Creatinine, Ser 0.87; Hemoglobin 14.5; Magnesium 2.3; Platelets 217; Potassium 4.0; Sodium 142; TSH 1.193    Lipid Panel     Component Value Date/Time   CHOL 214 (H) 01/19/2014 0918   TRIG 67 01/19/2014 0918   HDL 91 01/19/2014 0918   CHOLHDL 2.4 01/19/2014 0918   VLDL 13 01/19/2014 0918   LDLCALC 110 (H) 01/19/2014 0918     Wt Readings from Last 3 Encounters:  01/29/16 152 lb 3.2 oz (69 kg)  01/12/16 148 lb (67.1 kg)  09/18/15 152 lb 1.9 oz (69 kg)      Other studies Reviewed: Additional studies/ records that were reviewed today include: TTE 01/23/16 - Left ventricle: The cavity size was normal. Wall  thickness was   normal. Systolic function was normal. The estimated ejection   fraction was in the range of 55% to 60%. Wall motion was normal;   there were no regional wall motion abnormalities. Doppler   parameters are consistent with abnormal left ventricular   relaxation (grade 1 diastolic dysfunction). - Aortic valve: Trileaflet; mildly calcified leaflets. - Right ventricle: Pacer wire or catheter noted in right ventricle. - Right atrium: Central venous pressure (est): 3 mm Hg. - Tricuspid valve: There was trivial regurgitation. - Pulmonary arteries: PA peak pressure: 20 mm Hg (S). - Pericardium, extracardiac: There was no pericardial effusion.  ASSESSMENT AND PLAN:  1.  Complete heart block: dual chamber pacemaker placed 09/19/15.  She is continuing to feel quite poorly  since her device was put in and the settings were changed. It is unclear to me whether or not her symptoms are due to ventricular pacing, as she was quite symptomatic on testing of her threshold. Due to that, we'll fit her with a 48 hour monitor to try and correlate her symptoms with her pacing needs.   Current medicines are reviewed at length with the patient today.   The patient does not have concerns regarding her medicines.  The following changes were made today:  none  Labs/ tests ordered today include:  Orders Placed This Encounter  Procedures  . Holter monitor - 48 hour     Disposition:   FU with Lyndi Holbein 3 months  Signed, Royalty Domagala Meredith Leeds, MD  01/29/2016 2:28 PM     [facility] [address] [place] [address] [phone] (office) [phone] (fax)

## 2016-01-29 ENCOUNTER — Other Ambulatory Visit: Payer: Self-pay | Admitting: Family Medicine

## 2016-01-29 ENCOUNTER — Encounter: Payer: Self-pay | Admitting: Cardiology

## 2016-01-29 ENCOUNTER — Ambulatory Visit (INDEPENDENT_AMBULATORY_CARE_PROVIDER_SITE_OTHER): Payer: BLUE CROSS/BLUE SHIELD | Admitting: Cardiology

## 2016-01-29 VITALS — BP 110/66 | HR 78 | Ht 64.5 in | Wt 152.2 lb

## 2016-01-29 DIAGNOSIS — R0602 Shortness of breath: Secondary | ICD-10-CM

## 2016-01-29 DIAGNOSIS — I442 Atrioventricular block, complete: Secondary | ICD-10-CM

## 2016-01-29 DIAGNOSIS — R5383 Other fatigue: Secondary | ICD-10-CM | POA: Diagnosis not present

## 2016-01-29 NOTE — Patient Instructions (Signed)
Medication Instructions:  Your physician recommends that you continue on your current medications as directed. Please refer to the Current Medication list given to you today.  Labwork: None ordered  Testing/Procedures: Your physician has recommended that you wear a holter monitor. Holter monitors are medical devices that record the heart's electrical activity. Doctors most often use these monitors to diagnose arrhythmias. Arrhythmias are problems with the speed or rhythm of the heartbeat. The monitor is a small, portable device. You can wear one while you do your normal daily activities. This is usually used to diagnose what is causing palpitations/syncope (passing out).  Follow-Up: Your physician recommends that you schedule a follow-up appointment in: 3 months with Dr. Curt Bears.  If you need a refill on your cardiac medications before your next appointment, please call your pharmacy.  Thank you for choosing [facility]!!   Trinidad Curet, RN [phone]  Any Other Special Instructions Will Be Listed Below (If Applicable).   Holter Monitoring A Holter monitor is a small device that is used to detect abnormal heart rhythms. It clips to your clothing and is connected by wires to flat, sticky disks (electrodes) that attach to your chest. It is worn continuously for 24-48 hours. HOME CARE INSTRUCTIONS  Wear your Holter monitor at all times, even while exercising and sleeping, for as long as directed by your health care provider.  Make sure that the Holter monitor is safely clipped to your clothing or close to your body as recommended by your health care provider.  Do not get the monitor or wires wet.  Do not put body lotion or moisturizer on your chest.  Keep your skin clean.  Keep a diary of your daily activities, such as walking and doing chores. If you feel that your heartbeat is abnormal or that your heart is fluttering or skipping a beat:  Record what you are doing when it  happens.  Record what time of day the symptoms occur.  Return your Holter monitor as directed by your health care provider.  Keep all follow-up visits as directed by your health care provider. This is important. SEEK IMMEDIATE MEDICAL CARE IF:  You feel lightheaded or you faint.  You have trouble breathing.  You feel pain in your chest, upper arm, or jaw.  You feel sick to your stomach and your skin is pale, cool, or damp.  You heartbeat feels unusual or abnormal.   This information is not intended to replace advice given to you by your health care provider. Make sure you discuss any questions you have with your health care provider.   Document Released: 02/16/2004 Document Revised: 06/10/2014 Document Reviewed: 12/27/2013 Elsevier Interactive Patient Education [employer].

## 2016-02-06 ENCOUNTER — Telehealth: Payer: Self-pay | Admitting: Cardiology

## 2016-02-06 LAB — CUP PACEART INCLINIC DEVICE CHECK
Battery Voltage: 3.01 V
Brady Statistic RA Percent Paced: 0.07 %
Date Time Interrogation Session: 20170828175849
Implantable Lead Implant Date: 20170418
Implantable Lead Location: 753859
Lead Channel Impedance Value: 462.5 Ohm
Lead Channel Impedance Value: 525 Ohm
Lead Channel Pacing Threshold Pulse Width: 0.5 ms
Lead Channel Sensing Intrinsic Amplitude: 4 mV
Lead Channel Sensing Intrinsic Amplitude: 5 mV
Lead Channel Setting Pacing Amplitude: 2 V
Lead Channel Setting Pacing Pulse Width: 0.5 ms
MDC IDC LEAD IMPLANT DT: 20170418
MDC IDC LEAD LOCATION: 753860
MDC IDC MSMT BATTERY REMAINING LONGEVITY: 126
MDC IDC MSMT LEADCHNL RA PACING THRESHOLD AMPLITUDE: 0.75 V
MDC IDC MSMT LEADCHNL RA PACING THRESHOLD PULSEWIDTH: 0.5 ms
MDC IDC MSMT LEADCHNL RV PACING THRESHOLD AMPLITUDE: 1 V
MDC IDC SET LEADCHNL RV PACING AMPLITUDE: 2.5 V
MDC IDC SET LEADCHNL RV SENSING SENSITIVITY: 2 mV
MDC IDC STAT BRADY RV PERCENT PACED: 33 %
Pulse Gen Model: 2272
Pulse Gen Serial Number: 7882213

## 2016-02-06 NOTE — Telephone Encounter (Signed)
New message     Patient calling stating someone name Anderson Malta called left a message on Thursday. Seen Dr. Curt Bears on 8/28.

## 2016-02-06 NOTE — Telephone Encounter (Signed)
See 8/22 echo results for documentation related to this.

## 2016-02-12 ENCOUNTER — Ambulatory Visit (INDEPENDENT_AMBULATORY_CARE_PROVIDER_SITE_OTHER): Payer: BLUE CROSS/BLUE SHIELD

## 2016-02-12 DIAGNOSIS — R5383 Other fatigue: Secondary | ICD-10-CM

## 2016-02-12 DIAGNOSIS — I442 Atrioventricular block, complete: Secondary | ICD-10-CM | POA: Diagnosis not present

## 2016-02-12 DIAGNOSIS — R0602 Shortness of breath: Secondary | ICD-10-CM | POA: Diagnosis not present

## 2016-02-27 ENCOUNTER — Other Ambulatory Visit: Payer: Self-pay | Admitting: Nurse Practitioner

## 2016-02-28 NOTE — Telephone Encounter (Signed)
May have this in 2 refills, will need office visit before further refills

## 2016-03-12 ENCOUNTER — Ambulatory Visit (INDEPENDENT_AMBULATORY_CARE_PROVIDER_SITE_OTHER): Payer: BLUE CROSS/BLUE SHIELD | Admitting: Family Medicine

## 2016-03-12 ENCOUNTER — Encounter: Payer: Self-pay | Admitting: Family Medicine

## 2016-03-12 VITALS — BP 114/72 | Ht 65.0 in | Wt 156.0 lb

## 2016-03-12 DIAGNOSIS — M810 Age-related osteoporosis without current pathological fracture: Secondary | ICD-10-CM | POA: Diagnosis not present

## 2016-03-12 DIAGNOSIS — K5900 Constipation, unspecified: Secondary | ICD-10-CM | POA: Diagnosis not present

## 2016-03-12 DIAGNOSIS — Z780 Asymptomatic menopausal state: Secondary | ICD-10-CM | POA: Diagnosis not present

## 2016-03-12 DIAGNOSIS — L509 Urticaria, unspecified: Secondary | ICD-10-CM

## 2016-03-12 DIAGNOSIS — T783XXA Angioneurotic edema, initial encounter: Secondary | ICD-10-CM | POA: Diagnosis not present

## 2016-03-12 MED ORDER — RANITIDINE HCL 300 MG PO TABS: 300.0000 mg | ORAL_TABLET | Freq: Every day | ORAL | 3 refills | Status: DC

## 2016-03-12 NOTE — Progress Notes (Signed)
   Subjective:    Patient ID: [name], female    DOB: 01-07-1951, 65 y.o.   MRN: [id number]  Urticaria  This is a recurrent problem. Location: face and neck. The rash is characterized by itchiness and swelling. She was exposed to nothing. Associated symptoms include facial edema. Pertinent negatives include no cough, fatigue, fever or shortness of breath. Treatments tried: benadryl.  happened in January and  then again sept 15th. Has had three more break outs since sept 15th.    Pt has notice sometimes when she is under stress is when she has the break out. She has not noticed any type of correlation with what she eats or drinks or medications. She has been under a lot of stress. She states she gets these more when she gets stressed. She is tried Benadryl with this. Has tried fexofenadine with this.  Constipation. Taking miralax. She is up-to-date on colonoscopy she denies rectal bleeding she wonders if it could be medication causing this. She states she uses MiraLAX it doesn't really seem to do a lot.  Wants to get shingles vaccine and flu vaccine.   Review of Systems  Constitutional: Negative for activity change, fatigue and fever.  Respiratory: Negative for cough and shortness of breath.   Cardiovascular: Negative for chest pain and leg swelling.  Neurological: Negative for headaches.       Objective:   Physical Exam  Constitutional: She appears well-nourished. No distress.  Cardiovascular: Normal rate, regular rhythm and normal heart sounds.   No murmur heard. Pulmonary/Chest: Effort normal and breath sounds normal. No respiratory distress.  Musculoskeletal: She exhibits no edema.  Lymphadenopathy:    She has no cervical adenopathy.  Neurological: She is alert. She exhibits normal muscle tone.  Psychiatric: Her behavior is normal.  Vitals reviewed.         Assessment & Plan:  Idiopathic angioedema add Zantac daily referral to allergist I would recommend  fexofenadine daily also recommend Zantac daily. If allergist does testing and does not find any specific findings then I would not recommend any further interventions other then using these medicines.  Constipation bowel diary over the next few weeks also use stool softeners on a regular basis up-to-date on colonoscopy await the results of all these tests Hemoccult cards 3  Bone density in the upcoming weeks await the results of this. Continue medication for now.  Moods are doing better although stressed with a lot of dizziness she is having to take care of grandkids on the weekend work during the week can help her husband who is had some health issues therefore we will stop the Wellbutrin but the patient was encouraged to follow-up if any signs of depression she denies being depressed currently  Physical fatigue and stress due to over demands

## 2016-03-14 ENCOUNTER — Encounter: Payer: Self-pay | Admitting: Family Medicine

## 2016-03-28 ENCOUNTER — Ambulatory Visit (INDEPENDENT_AMBULATORY_CARE_PROVIDER_SITE_OTHER): Payer: BLUE CROSS/BLUE SHIELD | Admitting: Family Medicine

## 2016-03-28 ENCOUNTER — Encounter: Payer: Self-pay | Admitting: Family Medicine

## 2016-03-28 VITALS — BP 136/82 | Temp 97.5°F | Ht 65.0 in | Wt 153.2 lb

## 2016-03-28 DIAGNOSIS — L509 Urticaria, unspecified: Secondary | ICD-10-CM

## 2016-03-28 MED ORDER — PREDNISONE 20 MG PO TABS: ORAL_TABLET | ORAL | 0 refills | Status: DC

## 2016-03-28 MED ORDER — EPINEPHRINE 0.3 MG/0.3ML IJ SOAJ: 0.3000 mg | Freq: Once | INTRAMUSCULAR | 6 refills | Status: AC

## 2016-03-28 NOTE — Progress Notes (Signed)
   Subjective:    Patient ID: [name], female    DOB: 08-20-1950, 65 y.o.   MRN: [id number]  Urticaria  This is a recurrent problem. The affected locations include the neck and face. The rash is characterized by itchiness, redness, swelling and peeling. It is unknown if there was an exposure to a precipitant. Pertinent negatives include no cough, fatigue, fever or shortness of breath. Treatments tried: Benadryl, calamine lotion, cortisone cream.   Patients had 2 separate spells since last being seen. We referred her to allergist but she has not heard anything from the referral at this point Patient in today with c/o insomnia. Review of Systems  Constitutional: Negative for fatigue and fever.  Respiratory: Negative for cough and shortness of breath.   Cardiovascular: Negative for chest pain.  Gastrointestinal: Negative for abdominal pain.  Skin: Positive for rash.   She denies getting to the point of shortness of breath or inability to swallow or breathe.    Objective:   Physical Exam  Constitutional: She appears well-developed.  HENT:  Head: Normocephalic.  Cardiovascular: Normal rate and regular rhythm.   Pulmonary/Chest: Effort normal. No respiratory distress.  Abdominal: She exhibits no distension.   She does have mild angioedema which started up on her neck does not appear to be severe       Assessment & Plan:  Prednisone taper if the area starts getting worse Benadryl Zantac Allegra on a regular basis EpiPen prescribed use if life threatening allergy issues We will look into that allergy consultation and try to move forward on this.

## 2016-03-30 ENCOUNTER — Other Ambulatory Visit: Payer: Self-pay | Admitting: Nurse Practitioner

## 2016-04-01 ENCOUNTER — Other Ambulatory Visit (HOSPITAL_COMMUNITY): Payer: BLUE CROSS/BLUE SHIELD

## 2016-04-08 ENCOUNTER — Ambulatory Visit (HOSPITAL_COMMUNITY)
Admission: RE | Admit: 2016-04-08 | Discharge: 2016-04-08 | Disposition: A | Discharge status: Home / Self Care | Payer: BLUE CROSS/BLUE SHIELD | Source: Ambulatory Visit | Attending: Family Medicine | Admitting: Family Medicine

## 2016-04-08 DIAGNOSIS — Z780 Asymptomatic menopausal state: Secondary | ICD-10-CM | POA: Diagnosis not present

## 2016-04-08 DIAGNOSIS — M810 Age-related osteoporosis without current pathological fracture: Secondary | ICD-10-CM | POA: Diagnosis present

## 2016-04-15 NOTE — Progress Notes (Signed)
Electrophysiology Office Note   Date:  04/16/2016   ID:  [name], DOB 1951-02-24, MRN [id number]  PCP:  Jacqueline Lange, MD  Primary Electrophysiologist:  Caleigh Rabelo Meredith Leeds, MD    Chief Complaint  Patient presents with  . Pacemaker Check    Complete Heart Block     History of Present Illness: [name] is a 65 y.o. female who presents today for electrophysiology evaluation.   Presented to the hospital after being found in complete heart block.  St. Jude dual chamber pacemaker placed. Presented to the hospital with fatigue and had her pacemaker settings changed.  Since that time, she is felt much improved. She has not had any further symptoms of fatigue. She is planning on a hiking the [place] trail this spring and summer.   Today, she denies symptoms of chest pain, shortness of breath, orthopnea, PND, lower extremity edema, claudication, presyncope, syncope, bleeding, or neurologic sequela. The patient is tolerating medications without difficulties and is otherwise without complaint today.    Past Medical History:  Diagnosis Date  . Acid reflux   . Allergy   . Complete heart block ([place]) 09/2015   a. s/p St. Jude PPM 09/2015.  Marland Kitchen Former tobacco use   . Hyperlipidemia   . Migraines    Chronic  . PAT (paroxysmal atrial tachycardia) ([place])   . Presence of permanent cardiac pacemaker 09/19/2015  . Sinusitis   . Urticaria    Past Surgical History:  Procedure Laterality Date  . APPENDECTOMY    . BREAST ENHANCEMENT SURGERY  1985  . CHOLECYSTECTOMY    . COLONOSCOPY    . EP IMPLANTABLE DEVICE N/A 09/19/2015   Procedure: Pacemaker Implant;  Surgeon: Zenita Kister Meredith Leeds, MD;  Location: [facility];  Service: Cardiovascular;  Laterality: N/A;  . TONSILLECTOMY AND ADENOIDECTOMY    . TUBAL LIGATION       Current Outpatient Prescriptions  Medication Sig Dispense Refill  . alendronate (FOSAMAX) 70 MG tablet TAKE 1 TAB WITH A FULL GLASS OF WATER ON EMPTY  STOMACH AND SIT UPRIGHT FOR 30 MINS EVERY 7 DAYS 12 tablet 1  . Biotin 5000 MCG CAPS Take 5,000 mcg by mouth daily.    . Carboxymethylcellulose Sodium (THERATEARS OP) Place 5 drops into both eyes daily.    . clonazePAM (KLONOPIN) 0.5 MG tablet TAKE 1 TABLET BY MOUTH TWO TIMES DAILY AS NEEDED FOR ANXEITY 40 tablet 1  . levocetirizine (XYZAL) 5 MG tablet Take 1 tablet (5 mg total) by mouth every evening. 30 tablet 5  . mometasone (NASONEX) 50 MCG/ACT nasal spray Place 2 sprays into the nose daily. Two sprays each in each nostril 17 g 5  . montelukast (SINGULAIR) 10 MG tablet Take 1 tablet (10 mg total) by mouth at bedtime. 30 tablet 5  . Multiple Vitamin (MULTIVITAMIN WITH MINERALS) TABS tablet Take 1 tablet by mouth daily.    . Olopatadine HCl (PATADAY) 0.2 % SOLN Place 1 drop into both eyes 1 day or 1 dose. 1 Bottle 5  . PSYLLIUM PO Take 6 capsules by mouth at bedtime.    . ranitidine (ZANTAC) 300 MG tablet Take 1 tablet (300 mg total) by mouth at bedtime. 90 tablet 3  . rizatriptan (MAXALT-MLT) 10 MG disintegrating tablet TAKE ONE TABLET BY MOUTH AT ONSET OF MIGRAINE MAY REPEAT IN 2 HOURS IF NEEDED MAX 2 PER 24 HOURS 18 tablet 5  . topiramate (TOPAMAX) 100 MG tablet TAKE 1 TABLET (100 MG TOTAL) BY  MOUTH 2 (TWO) TIMES DAILY. 180 tablet 1  . vitamin C (ASCORBIC ACID) 500 MG tablet Take 500 mg by mouth 2 (two) times daily.     No current facility-administered medications for this visit.     Allergies:   Beta adrenergic blockers; Ambien [zolpidem tartrate]; and Biaxin [clarithromycin]   Social History:  The patient  reports that she has quit smoking. She quit smokeless tobacco use about 9 years ago. She reports that she does not drink alcohol or use drugs.   Family History:  The patient's family history includes Allergic rhinitis in her maternal grandmother; Asthma in her maternal grandmother and paternal grandmother; Cancer in her maternal grandfather; Congestive Heart Failure in her mother;  Dementia in her mother; Diabetes in her maternal grandmother, mother, paternal grandmother, and sister; Heart attack in her paternal grandmother; Heart disease in her paternal grandmother; Lung cancer in her father; Parkinson's disease in her mother.    ROS:  Please see the history of present illness.   Otherwise, review of systems is positive for none.   All other systems are reviewed and negative.    PHYSICAL EXAM: VS:  BP 120/72   Pulse 62   Ht 5\' 4"  (1.626 m)   Wt 157 lb (71.2 kg)   BMI 26.95 kg/m  , BMI Body mass index is 26.95 kg/m. GEN: Well nourished, well developed, in no acute distress  HEENT: normal  Neck: no JVD, carotid bruits, or masses Cardiac: RRR; no murmurs, rubs, or gallops,no edema  Respiratory:  clear to auscultation bilaterally, normal work of breathing GI: soft, nontender, nondistended, + BS MS: no deformity or atrophy  Skin: warm and dry,  device pocket is well healed Neuro:  Strength and sensation are intact Psych: euthymic mood, full affect  EKG:  EKG is not ordered today. Personal review of the ekg ordered 01/12/16 shows sinus rhythm, RBBB, LPFB   Device interrogation is personally reviewed today in detail.  See PaceArt for details.   Recent Labs: 01/12/2016: B Natriuretic Peptide 25.8; BUN 17; Creatinine, Ser 0.87; Hemoglobin 14.5; Magnesium 2.3; Platelets 217; Potassium 4.0; Sodium 142; TSH 1.193    Lipid Panel     Component Value Date/Time   CHOL 214 (H) 01/19/2014 0918   TRIG 67 01/19/2014 0918   HDL 91 01/19/2014 0918   CHOLHDL 2.4 01/19/2014 0918   VLDL 13 01/19/2014 0918   LDLCALC 110 (H) 01/19/2014 0918     Wt Readings from Last 3 Encounters:  04/16/16 157 lb (71.2 kg)  04/16/16 157 lb (71.2 kg)  03/28/16 153 lb 4 oz (69.5 kg)      Other studies Reviewed: Additional studies/ records that were reviewed today include: TTE 01/23/16 - Left ventricle: The cavity size was normal. Wall thickness was   normal. Systolic function was  normal. The estimated ejection   fraction was in the range of 55% to 60%. Wall motion was normal;   there were no regional wall motion abnormalities. Doppler   parameters are consistent with abnormal left ventricular   relaxation (grade 1 diastolic dysfunction). - Aortic valve: Trileaflet; mildly calcified leaflets. - Right ventricle: Pacer wire or catheter noted in right ventricle. - Right atrium: Central venous pressure (est): 3 mm Hg. - Tricuspid valve: There was trivial regurgitation. - Pulmonary arteries: PA peak pressure: 20 mm Hg (S). - Pericardium, extracardiac: There was no pericardial effusion.  Holter 02/12/16 Sinus rhythm 1 degree AV block Minimum HR: 50 BPM at 12:59:11 AM Maximum HR: 118 BPM at  8:00:47 AM Average HR: 74 BPM.  ASSESSMENT AND PLAN:  1.  Complete heart block: dual chamber pacemaker placed 09/19/15.  She paces 60% of the time in the ventricle. She was having episodes of fatigue, but that has improved. We'll continue current management.   Current medicines are reviewed at length with the patient today.   The patient does not have concerns regarding her medicines.  The following changes were made today:  none  Labs/ tests ordered today include:  No orders of the defined types were placed in this encounter.    Disposition:   FU with Jawaan Adachi 6 months  Signed, Jonathon Tan Meredith Leeds, MD  04/16/2016 11:30 AM     [facility] [address] [place] [address] [phone] (office) [phone] (fax)

## 2016-04-16 ENCOUNTER — Ambulatory Visit (INDEPENDENT_AMBULATORY_CARE_PROVIDER_SITE_OTHER): Payer: BLUE CROSS/BLUE SHIELD | Admitting: Cardiology

## 2016-04-16 ENCOUNTER — Ambulatory Visit (INDEPENDENT_AMBULATORY_CARE_PROVIDER_SITE_OTHER): Payer: BLUE CROSS/BLUE SHIELD | Admitting: Allergy and Immunology

## 2016-04-16 ENCOUNTER — Encounter: Payer: Self-pay | Admitting: Cardiology

## 2016-04-16 ENCOUNTER — Encounter: Payer: Self-pay | Admitting: Allergy and Immunology

## 2016-04-16 VITALS — BP 108/68 | HR 84 | Temp 98.0°F | Ht 63.0 in | Wt 157.0 lb

## 2016-04-16 VITALS — BP 120/72 | HR 62 | Ht 64.0 in | Wt 157.0 lb

## 2016-04-16 DIAGNOSIS — J3089 Other allergic rhinitis: Secondary | ICD-10-CM

## 2016-04-16 DIAGNOSIS — L509 Urticaria, unspecified: Secondary | ICD-10-CM

## 2016-04-16 DIAGNOSIS — I442 Atrioventricular block, complete: Secondary | ICD-10-CM

## 2016-04-16 DIAGNOSIS — H1013 Acute atopic conjunctivitis, bilateral: Secondary | ICD-10-CM

## 2016-04-16 DIAGNOSIS — T7840XA Allergy, unspecified, initial encounter: Secondary | ICD-10-CM | POA: Insufficient documentation

## 2016-04-16 DIAGNOSIS — H1010 Acute atopic conjunctivitis, unspecified eye: Secondary | ICD-10-CM | POA: Insufficient documentation

## 2016-04-16 DIAGNOSIS — T783XXA Angioneurotic edema, initial encounter: Secondary | ICD-10-CM

## 2016-04-16 LAB — CUP PACEART INCLINIC DEVICE CHECK
Battery Remaining Longevity: 114 mo
Battery Voltage: 3.01 V
Brady Statistic RA Percent Paced: 0.19 %
Brady Statistic RV Percent Paced: 60 %
Date Time Interrogation Session: 20171114113605
Implantable Lead Implant Date: 20170418
Implantable Lead Implant Date: 20170418
Implantable Pulse Generator Implant Date: 20170418
Lead Channel Impedance Value: 475 Ohm
Lead Channel Pacing Threshold Amplitude: 0.75 V
Lead Channel Pacing Threshold Amplitude: 1 V
Lead Channel Pacing Threshold Pulse Width: 0.5 ms
Lead Channel Pacing Threshold Pulse Width: 0.5 ms
Lead Channel Sensing Intrinsic Amplitude: 4 mV
Lead Channel Setting Pacing Amplitude: 2 V
Lead Channel Setting Sensing Sensitivity: 2 mV
MDC IDC LEAD LOCATION: 753859
MDC IDC LEAD LOCATION: 753860
MDC IDC MSMT LEADCHNL RA IMPEDANCE VALUE: 562.5 Ohm
MDC IDC MSMT LEADCHNL RA PACING THRESHOLD AMPLITUDE: 0.75 V
MDC IDC MSMT LEADCHNL RA SENSING INTR AMPL: 5 mV
MDC IDC MSMT LEADCHNL RV PACING THRESHOLD AMPLITUDE: 1 V
MDC IDC MSMT LEADCHNL RV PACING THRESHOLD PULSEWIDTH: 0.5 ms
MDC IDC MSMT LEADCHNL RV PACING THRESHOLD PULSEWIDTH: 0.5 ms
MDC IDC SET LEADCHNL RV PACING AMPLITUDE: 2.5 V
MDC IDC SET LEADCHNL RV PACING PULSEWIDTH: 0.5 ms
Pulse Gen Model: 2272
Pulse Gen Serial Number: 7882213

## 2016-04-16 MED ORDER — MOMETASONE FUROATE 50 MCG/ACT NA SUSP: 2.0000 | Freq: Every day | NASAL | 5 refills | Status: DC

## 2016-04-16 MED ORDER — MONTELUKAST SODIUM 10 MG PO TABS: 10.0000 mg | ORAL_TABLET | Freq: Every day | ORAL | 5 refills | Status: DC

## 2016-04-16 MED ORDER — OLOPATADINE HCL 0.2 % OP SOLN: 1.0000 [drp] | OPHTHALMIC | 5 refills | Status: DC

## 2016-04-16 MED ORDER — LEVOCETIRIZINE DIHYDROCHLORIDE 5 MG PO TABS: 5.0000 mg | ORAL_TABLET | Freq: Every evening | ORAL | 5 refills | Status: DC

## 2016-04-16 NOTE — Patient Instructions (Signed)
Medication Instructions: - Your physician recommends that you continue on your current medications as directed. Please refer to the Current Medication list given to you today.  Labwork: - none ordered  Procedures/Testing: - none ordered  Follow-Up: - Remote monitoring is used to monitor your Pacemaker of ICD from home. This monitoring reduces the number of office visits required to check your device to one time per year. It allows [place] to keep an eye on the functioning of your device to ensure it is working properly. You are scheduled for a device check from home on 07/16/16. You may send your transmission at any time that day. If you have a wireless device, the transmission will be sent automatically. After your physician reviews your transmission, you will receive a postcard with your next transmission date.  - Your physician wants you to follow up in: April 2018 with Dr. Curt Bears. You will receive a reminder letter in the mail two months in advance. If you don't receive a letter, please call our office to schedule the follow-up appointment.  Any Additional Special Instructions Will Be Listed Below (If Applicable).     If you need a refill on your cardiac medications before your next appointment, please call your pharmacy.

## 2016-04-16 NOTE — Progress Notes (Signed)
New Patient Note  RE: [name] MRN: [id number] DOB: 1951-02-20 Date of Office Visit: 04/16/2016  Referring provider: Kathyrn Drown, MD Primary care provider: [name], MD  Chief Complaint: Allergic Reaction; Urticaria; Angioedema; and Allergic Rhinitis    History of present illness: [name] is a 65 y.o. female seen today in consultation requested by [name], MD.  She reports that on September 15 she woke up at 3 am with large hives on her anterior neck.  The hives were pruritic and developed into swelling of the skin on the neck and jaw area.  In addition, she developed significant swelling of the left upper and lower eyelid. She did not experience concomitant cardiopulmonary or GI symptoms.  She had consumed shrimp and scallops for dinner at 8 PM.  She also reports that she is exposed to environmental mold at the time of symptom onset.  Since September 15 she has had 7 additional more mild episodes.  She has not consumed shellfish prior to any of these subsequent episodes though she is uncertain about mold exposure.  On one occasion she was consuming [place] yogurt at the time of symptom onset, otherwise she is unable to identify any other specific or common triggers.  She received systemic steroids from her primary care physician in an attempt to control the symptoms on 2 occasions, otherwise she has been taking fexofenadine, ranitidine, and diphenhydramine.  She reports that she had food allergies to chocolate, eggs, and milk as a child but has outgrown those allergies.  She experiences nasal congestion, rhinorrhea, sneezing, postnasal drainage, ocular pruritus "all the time", as well as occasional sinus pressure.  She takes fexofenadine year around in an attempt to control these symptoms.   Assessment and plan: Allergic reactions Mariabelen's history suggests allergic reaction with an unclear trigger versus allergic urticaria/angioedema secondary to mold exposure versus  idiopathic urticaria with associated angioedema. Food allergen skin tests were negative today despite a positive histamine control. The negative predictive value for skin tests is excellent (greater than 95%). We will proceed with in vitro lab studies to clarify the etiology.  The following labs have been ordered: FCeRI antibody, TSH, anti-thyroglobulin antibody, thyroid peroxidase antibody, baseline serum tryptase, C4 level, CBC, CMP, ESR, ANA, as well as serum specific IgE against shellfish panel and galactose-alpha-1,3-galactose. An additional lab order for serum tryptase has been provided which is to be kept by the patient to be drawn in the emergency department within 4 hours of symptom onset should symptoms recur.  Instructions have been discussed and provided for H1/H2 receptor blockade with titration to find lowest effective dose.  A prescription has been provided for levocetirizine, 107m daily as needed.  A prescription has been provided for montelukast 10 mg daily at bedtime.  Should symptoms recur, the patient has been asked to keep a journal to record any foods eaten, beverages consumed, medications taken within a 6 hour period prior to the onset of symptoms, as well as record activities being performed, and environmental conditions. For any symptoms concerning for anaphylaxis, epinephrine is to be administered and 911 is to be called immediately.  A prescription has been provided for epinephrine 0.3 mg autoinjector 2 pack along with instructions for its proper administration.  Perennial allergic rhinitis  Aeroallergen avoidance measures have been discussed and provided in written form.  Persistent have been provided for levocetirizine and montelukast (as above).  A prescription has been provided for Nasonex nasal spray, one spray per nostril 1-2 times daily  as needed. Proper nasal spray technique has been discussed and demonstrated.  I have also recommended nasal saline spray (i.e.,  Simply Saline) or nasal saline lavage (i.e., NeilMed) as needed prior to medicated nasal sprays.  Allergic conjunctivitis  Treatment plan as outlined above for allergic rhinitis.  A prescription has been provided for Pazeo, one drop per eye daily as needed.  I have also recommended eye lubricant drops (i.e., Natural Tears) as needed.   Meds ordered this encounter  Medications  . montelukast (SINGULAIR) 10 MG tablet    Sig: Take 1 tablet (10 mg total) by mouth at bedtime.    Dispense:  30 tablet    Refill:  5  . levocetirizine (XYZAL) 5 MG tablet    Sig: Take 1 tablet (5 mg total) by mouth every evening.    Dispense:  30 tablet    Refill:  5  . mometasone (NASONEX) 50 MCG/ACT nasal spray    Sig: Place 2 sprays into the nose daily. Two sprays each in each nostril    Dispense:  17 g    Refill:  5  . Olopatadine HCl (PATADAY) 0.2 % SOLN    Sig: Place 1 drop into both eyes 1 day or 1 dose.    Dispense:  1 Bottle    Refill:  5    Diagnostics: Environmental skin testing: Positive to multiple molds, cat hair, and dog epithelia. Food allergen skin testing:  Negative despite a positive histamine control.    Physical examination: Blood pressure 108/68, pulse 84, temperature 98 F (36.7 C), temperature source Oral, height 5' 3"  (1.6 m), weight 157 lb (71.2 kg).  General: Alert, interactive, in no acute distress. HEENT: TMs pearly gray, turbinates moderately edematous without discharge, post-pharynx moderately erythematous. Neck: Supple without lymphadenopathy. Lungs: Clear to auscultation without wheezing, rhonchi or rales. CV: Normal S1, S2 without murmurs. Abdomen: Nondistended, nontender. Skin: Warm and dry, without lesions or rashes. Extremities:  No clubbing, cyanosis or edema. Neuro:   Grossly intact.  Review of systems:  Review of systems negative except as noted in HPI / PMHx or noted below: Review of Systems  Constitutional: Negative.   HENT: Negative.   Eyes:  Negative.   Respiratory: Negative.   Cardiovascular: Negative.   Gastrointestinal: Negative.   Genitourinary: Negative.   Musculoskeletal: Negative.   Skin: Negative.   Neurological: Negative.   Endo/Heme/Allergies: Negative.   Psychiatric/Behavioral: Negative.     Past medical history:  Past Medical History:  Diagnosis Date  . Acid reflux   . Allergy   . Complete heart block ([place]) 09/2015   a. s/p St. Jude PPM 09/2015.  Marland Kitchen Former tobacco use   . Hyperlipidemia   . Migraines    Chronic  . PAT (paroxysmal atrial tachycardia) ([place])   . Presence of permanent cardiac pacemaker 09/19/2015  . Sinusitis   . Urticaria     Past surgical history:  Past Surgical History:  Procedure Laterality Date  . APPENDECTOMY    . BREAST ENHANCEMENT SURGERY  1985  . CHOLECYSTECTOMY    . COLONOSCOPY    . EP IMPLANTABLE DEVICE N/A 09/19/2015   Procedure: Pacemaker Implant;  Surgeon: Will Meredith Leeds, MD;  Location: [facility];  Service: Cardiovascular;  Laterality: N/A;  . TONSILLECTOMY AND ADENOIDECTOMY    . TUBAL LIGATION      Family history: Family History  Problem Relation Age of Onset  . Congestive Heart Failure Mother   . Diabetes Mother   . Dementia Mother   .  Parkinson's disease Mother   . Lung cancer Father   . Diabetes Sister   . Diabetes Maternal Grandmother   . Allergic rhinitis Maternal Grandmother   . Asthma Maternal Grandmother   . Cancer Maternal Grandfather   . Diabetes Paternal Grandmother   . Heart attack Paternal Grandmother   . Heart disease Paternal Grandmother   . Asthma Paternal Grandmother   . Angioedema Neg Hx   . Eczema Neg Hx   . Urticaria Neg Hx     Social history: Social History   Social History  . Marital status: Married    Spouse name: N/A  . Number of children: N/A  . Years of education: N/A   Occupational History  . Not on file.   Social History Main Topics  . Smoking status: Former Research scientist (life sciences)  . Smokeless tobacco: Former Systems developer      Quit date: 01/06/2007  . Alcohol use No  . Drug use: No  . Sexual activity: Not on file   Other Topics Concern  . Not on file   Social History Narrative  . No narrative on file   Environmental History: The patient lives in a 64 year old house with carpeting the bedroom and central air/heat.  She has no pets.  She is a former cigarette smoker having quit in 2007 with a 35-pack-year history.    Medication List       Accurate as of 04/16/16  1:17 PM. Always use your most recent med list.          alendronate 70 MG tablet Commonly known as:  FOSAMAX TAKE 1 TAB WITH A FULL GLASS OF WATER ON EMPTY STOMACH AND SIT UPRIGHT FOR 30 MINS EVERY 7 DAYS   Biotin 5000 MCG Caps Take 5,000 mcg by mouth daily.   clonazePAM 0.5 MG tablet Commonly known as:  KLONOPIN TAKE 1 TABLET BY MOUTH TWO TIMES DAILY AS NEEDED FOR ANXEITY   levocetirizine 5 MG tablet Commonly known as:  XYZAL Take 1 tablet (5 mg total) by mouth every evening.   mometasone 50 MCG/ACT nasal spray Commonly known as:  NASONEX Place 2 sprays into the nose daily. Two sprays each in each nostril   montelukast 10 MG tablet Commonly known as:  SINGULAIR Take 1 tablet (10 mg total) by mouth at bedtime.   multivitamin with minerals Tabs tablet Take 1 tablet by mouth daily.   Olopatadine HCl 0.2 % Soln Commonly known as:  PATADAY Place 1 drop into both eyes 1 day or 1 dose.   PSYLLIUM PO Take 6 capsules by mouth at bedtime.   ranitidine 300 MG tablet Commonly known as:  ZANTAC Take 1 tablet (300 mg total) by mouth at bedtime.   rizatriptan 10 MG disintegrating tablet Commonly known as:  MAXALT-MLT TAKE ONE TABLET BY MOUTH AT ONSET OF MIGRAINE MAY REPEAT IN 2 HOURS IF NEEDED MAX 2 PER 24 HOURS   THERATEARS OP Place 5 drops into both eyes daily.   topiramate 100 MG tablet Commonly known as:  TOPAMAX TAKE 1 TABLET (100 MG TOTAL) BY MOUTH 2 (TWO) TIMES DAILY.   vitamin C 500 MG tablet Commonly known as:   ASCORBIC ACID Take 500 mg by mouth 2 (two) times daily.       Known medication allergies: Allergies  Allergen Reactions  . Beta Adrenergic Blockers Shortness Of Breath  . Ambien [Zolpidem Tartrate] Other (See Comments)    Hallucinations  . Biaxin [Clarithromycin] Hives    May have caused hives see 06/19/15  I appreciate the opportunity to take part in Diane's care. Please do not hesitate to contact me with questions.  Sincerely,   R. Edgar Frisk, MD

## 2016-04-16 NOTE — Assessment & Plan Note (Addendum)
Avia's history suggests allergic reaction with an unclear trigger versus allergic urticaria/angioedema secondary to mold exposure versus idiopathic urticaria with associated angioedema. Food allergen skin tests were negative today despite a positive histamine control. The negative predictive value for skin tests is excellent (greater than 95%). We will proceed with in vitro lab studies to clarify the etiology.  The following labs have been ordered: FCeRI antibody, TSH, anti-thyroglobulin antibody, thyroid peroxidase antibody, baseline serum tryptase, C4 level, CBC, CMP, ESR, ANA, as well as serum specific IgE against shellfish panel and galactose-alpha-1,3-galactose. An additional lab order for serum tryptase has been provided which is to be kept by the patient to be drawn in the emergency department within 4 hours of symptom onset should symptoms recur.  Instructions have been discussed and provided for H1/H2 receptor blockade with titration to find lowest effective dose.  A prescription has been provided for levocetirizine, 80m daily as needed.  A prescription has been provided for montelukast 10 mg daily at bedtime.  Should symptoms recur, the patient has been asked to keep a journal to record any foods eaten, beverages consumed, medications taken within a 6 hour period prior to the onset of symptoms, as well as record activities being performed, and environmental conditions. For any symptoms concerning for anaphylaxis, epinephrine is to be administered and 911 is to be called immediately.  A prescription has been provided for epinephrine 0.3 mg autoinjector 2 pack along with instructions for its proper administration.

## 2016-04-16 NOTE — Patient Instructions (Addendum)
Allergic reactions [name]'s history suggests allergic reaction with an unclear trigger versus allergic urticaria/angioedema secondary to mold exposure versus idiopathic urticaria with associated angioedema. Food allergen skin tests were negative today despite a positive histamine control. The negative predictive value for skin tests is excellent (greater than 95%). We will proceed with in vitro lab studies to clarify the etiology.  The following labs have been ordered: FCeRI antibody, TSH, anti-thyroglobulin antibody, thyroid peroxidase antibody, baseline serum tryptase, C4 level, CBC, CMP, ESR, ANA, as well as serum specific IgE against shellfish panel and galactose-alpha-1,3-galactose. An additional lab order for serum tryptase has been provided which is to be kept by the patient to be drawn in the emergency department within 4 hours of symptom onset should symptoms recur.  Instructions have been discussed and provided for H1/H2 receptor blockade with titration to find lowest effective dose.  A prescription has been provided for levocetirizine, 38m daily as needed.  A prescription has been provided for montelukast 10 mg daily at bedtime.  Should symptoms recur, the patient has been asked to keep a journal to record any foods eaten, beverages consumed, medications taken within a 6 hour period prior to the onset of symptoms, as well as record activities being performed, and environmental conditions. For any symptoms concerning for anaphylaxis, epinephrine is to be administered and 911 is to be called immediately.  A prescription has been provided for epinephrine 0.3 mg autoinjector 2 pack along with instructions for its proper administration.  Perennial allergic rhinitis  Aeroallergen avoidance measures have been discussed and provided in written form.  Persistent have been provided for levocetirizine and montelukast (as above).  A prescription has been provided for Nasonex nasal spray, one spray  per nostril 1-2 times daily as needed. Proper nasal spray technique has been discussed and demonstrated.  I have also recommended nasal saline spray (i.e., Simply Saline) or nasal saline lavage (i.e., NeilMed) as needed prior to medicated nasal sprays.  Allergic conjunctivitis  Treatment plan as outlined above for allergic rhinitis.  A prescription has been provided for Pazeo, one drop per eye daily as needed.  I have also recommended eye lubricant drops (i.e., Natural Tears) as needed.   When lab results have returned the patient will be called with further recommendations and follow up instructions.  Urticaria (Hives)  . Levocetirizine (Xyzal) 5 mg twice a day and ranitidine (Zantac) 150 mg twice a day. If no symptoms for 7-14 days then decrease to. . Levocetirizine (Xyzal) 5 mg twice a day and ranitidine (Zantac) 150 mg once a day.  If no symptoms for 7-14 days then decrease to. . Levocetirizine (Xyzal) 5 mg twice a day.  If no symptoms for 7-14 days then decrease to. . Levocetirizine (Xyzal) 5 mg once a day.  May use Benadryl (diphenhydramine) as needed for breakthrough symptoms       If symptoms return, then step up dosage  Control of Mold Allergen  Mold and fungi can grow on a variety of surfaces provided certain temperature and moisture conditions exist.  Outdoor molds grow on plants, decaying vegetation and soil.  The major outdoor mold, Alternaria and Cladosporium, are found in very high numbers during hot and dry conditions.  Generally, a late Summer - Fall peak is seen for common outdoor fungal spores.  Rain will temporarily lower outdoor mold spore count, but counts rise rapidly when the rainy period ends.  The most important indoor molds are Aspergillus and Penicillium.  Dark, humid and poorly ventilated basements are ideal  sites for mold growth.  The next most common sites of mold growth are the bathroom and the kitchen.  Outdoor [employer] 4. Use air conditioning and  keep windows closed 5. Avoid exposure to decaying vegetation. 6. Avoid leaf raking. 7. Avoid grain handling. 8. Consider wearing a face mask if working in moldy areas.  Indoor Mold Control 1. Maintain humidity below 50%. 2. Clean washable surfaces with 5% bleach solution. 3. Remove sources e.g. Contaminated carpets.  Control of Dog or Cat Allergen  Avoidance is the best way to manage a dog or cat allergy. If you have a dog or cat and are allergic to dog or cats, consider removing the dog or cat from the home. If you have a dog or cat but don't want to find it a new home, or if your family wants a pet even though someone in the household is allergic, here are some strategies that may help keep symptoms at bay:  1. Keep the pet out of your bedroom and restrict it to only a few rooms. Be advised that keeping the dog or cat in only one room will not limit the allergens to that room. 2. Don't pet, hug or kiss the dog or cat; if you do, wash your hands with soap and water. 3. High-efficiency particulate air (HEPA) cleaners run continuously in a bedroom or living room can reduce allergen levels over time. 4. Regular use of a high-efficiency vacuum cleaner or a central vacuum can reduce allergen levels. 5. Giving your dog or cat a bath at least once a week can reduce airborne allergen.

## 2016-04-16 NOTE — Assessment & Plan Note (Signed)
   Aeroallergen avoidance measures have been discussed and provided in written form.  Persistent have been provided for levocetirizine and montelukast (as above).  A prescription has been provided for Nasonex nasal spray, one spray per nostril 1-2 times daily as needed. Proper nasal spray technique has been discussed and demonstrated.  I have also recommended nasal saline spray (i.e., Simply Saline) or nasal saline lavage (i.e., NeilMed) as needed prior to medicated nasal sprays.

## 2016-04-16 NOTE — Assessment & Plan Note (Signed)
   Treatment plan as outlined above for allergic rhinitis.  A prescription has been provided for Pazeo, one drop per eye daily as needed.  I have also recommended eye lubricant drops (i.e., Natural Tears) as needed. 

## 2016-04-17 ENCOUNTER — Other Ambulatory Visit: Payer: Self-pay | Admitting: Nurse Practitioner

## 2016-04-19 LAB — CBC WITH DIFFERENTIAL/PLATELET
Basophils Absolute: 0 cells/uL (ref 0–200)
Basophils Relative: 0 %
Eosinophils Absolute: 236 cells/uL (ref 15–500)
Eosinophils Relative: 4 %
HCT: 43 % (ref 35.0–45.0)
Hemoglobin: 13.7 g/dL (ref 11.7–15.5)
Lymphocytes Relative: 48 %
Lymphs Abs: 2832 cells/uL (ref 850–3900)
MCH: 31 pg (ref 27.0–33.0)
MCHC: 31.9 g/dL — ABNORMAL LOW (ref 32.0–36.0)
MCV: 97.3 fL (ref 80.0–100.0)
MPV: 10.4 fL (ref 7.5–12.5)
Monocytes Absolute: 413 cells/uL (ref 200–950)
Monocytes Relative: 7 %
Neutro Abs: 2419 cells/uL (ref 1500–7800)
Neutrophils Relative %: 41 %
Platelets: 250 10*3/uL (ref 140–400)
RBC: 4.42 MIL/uL (ref 3.80–5.10)
RDW: 13.3 % (ref 11.0–15.0)
WBC: 5.9 10*3/uL (ref 3.8–10.8)

## 2016-04-19 LAB — COMPREHENSIVE METABOLIC PANEL
ALT: 10 U/L (ref 6–29)
AST: 17 U/L (ref 10–35)
Albumin: 3.9 g/dL (ref 3.6–5.1)
Alkaline Phosphatase: 54 U/L (ref 33–130)
BUN: 18 mg/dL (ref 7–25)
CO2: 25 mmol/L (ref 20–31)
Calcium: 8.8 mg/dL (ref 8.6–10.4)
Chloride: 107 mmol/L (ref 98–110)
Creat: 0.92 mg/dL (ref 0.50–0.99)
Glucose, Bld: 78 mg/dL (ref 65–99)
Potassium: 4 mmol/L (ref 3.5–5.3)
Sodium: 139 mmol/L (ref 135–146)
Total Bilirubin: 0.3 mg/dL (ref 0.2–1.2)
Total Protein: 6.1 g/dL (ref 6.1–8.1)

## 2016-04-19 LAB — C4 COMPLEMENT: C4 Complement: 28 mg/dL (ref 16–47)

## 2016-04-19 LAB — ANA: Anti Nuclear Antibody(ANA): NEGATIVE

## 2016-04-19 LAB — ALLERGY-SHELLFISH PANEL
Clams: <0.1 kU/L
Crab: <0.1 kU/L
Lobster: <0.1 kU/L
Shrimp IgE: <0.1 kU/L

## 2016-04-19 LAB — SEDIMENTATION RATE: Sed Rate: 1 mm/hr (ref 0–30)

## 2016-04-19 LAB — TRYPTASE: Tryptase: 20.1 ug/L — ABNORMAL HIGH (ref <11)

## 2016-04-22 ENCOUNTER — Other Ambulatory Visit: Payer: Self-pay | Admitting: Family Medicine

## 2016-04-23 LAB — ALPHA-GAL PANEL
Beef IgE: <0.1 kU/L (ref <0.35)
Class: 0
Class: 0
Class: 0
Galactose-alpha-1,3-galactose IgE*: 0.35 kU/L — ABNORMAL HIGH (ref <0.35)
Lamb/Mutton IgE: <0.1 kU/L (ref <0.35)
Pork IgE: <0.1 kU/L (ref <0.35)

## 2016-04-24 LAB — CP CHRONIC URTICARIA INDEX PANEL
Histamine Release: <16 % (ref <16)
TSH: 1.45 mIU/L
Thyroglobulin Ab: <1 IU/mL (ref <2)
Thyroperoxidase Ab SerPl-aCnc: 1 IU/mL (ref <9)

## 2016-04-30 ENCOUNTER — Telehealth: Payer: Self-pay

## 2016-04-30 NOTE — Telephone Encounter (Signed)
Clld pt - LMOVMTC re lab results. 

## 2016-04-30 NOTE — Telephone Encounter (Signed)
-----   Message from Adelina Mings, MD sent at 04/29/2016  6:01 PM EST ----- Elevated tryptase level and boderline elevated alph-gal. Please inform the patient that she is to avoid mammalian meat and have access to epinephrine autoinjector. We will recheck tryptase level and alpha-gal IgE (not the whole alpha gal panel though). Thanks.

## 2016-05-01 ENCOUNTER — Telehealth: Payer: Self-pay

## 2016-05-01 DIAGNOSIS — T783XXA Angioneurotic edema, initial encounter: Secondary | ICD-10-CM

## 2016-05-01 MED ORDER — RANITIDINE HCL 150 MG PO TABS: ORAL_TABLET | ORAL | 5 refills | Status: DC

## 2016-05-01 NOTE — Telephone Encounter (Addendum)
Per result note, forwarded order for labs for recheck of Tryptase and AlphaGal (not whole panel) to Dr. Verlin Fester.  Awaiting him to sign and print then will fax to patient to get drawn today.  Also will  fax AVS to patient for clarification on how to take medications for hives. Patient fax: [phone]

## 2016-05-02 LAB — TRYPTASE: Tryptase: 21.7 ug/L — ABNORMAL HIGH (ref <11)

## 2016-05-02 NOTE — Telephone Encounter (Signed)
Noted, orders signed

## 2016-05-06 LAB — GALACTOSE-ALPHA-1,3-GALACTOSE IGE: Galactose-alpha-1,3-galactose IgE: 0.34 kU/L (ref <0.35)

## 2016-05-14 ENCOUNTER — Encounter: Payer: Self-pay | Admitting: Allergy and Immunology

## 2016-05-14 ENCOUNTER — Telehealth: Payer: Self-pay | Admitting: Allergy and Immunology

## 2016-05-14 ENCOUNTER — Ambulatory Visit (INDEPENDENT_AMBULATORY_CARE_PROVIDER_SITE_OTHER): Payer: BLUE CROSS/BLUE SHIELD | Admitting: Allergy and Immunology

## 2016-05-14 VITALS — BP 110/70 | HR 81 | Temp 98.2°F | Resp 16 | Ht 62.5 in | Wt 156.2 lb

## 2016-05-14 DIAGNOSIS — J3089 Other allergic rhinitis: Secondary | ICD-10-CM | POA: Diagnosis not present

## 2016-05-14 DIAGNOSIS — L509 Urticaria, unspecified: Secondary | ICD-10-CM

## 2016-05-14 DIAGNOSIS — T7840XD Allergy, unspecified, subsequent encounter: Secondary | ICD-10-CM | POA: Diagnosis not present

## 2016-05-14 DIAGNOSIS — R748 Abnormal levels of other serum enzymes: Secondary | ICD-10-CM | POA: Diagnosis not present

## 2016-05-14 MED ORDER — EPINEPHRINE 0.3 MG/0.3ML IJ SOAJ: 0.3000 mg | Freq: Once | INTRAMUSCULAR | 0 refills | Status: AC

## 2016-05-14 NOTE — Telephone Encounter (Signed)
Pt called and said the dermatology she once seen is not practicing and need you to send her to another dermatology for skin biopsy and would like it for this year because they have reach there out of pocket. [phone]

## 2016-05-14 NOTE — Assessment & Plan Note (Addendum)
Stable.  Continue appropriate allergen avoidance measures, levocetirizine, and Nasonex as needed.

## 2016-05-14 NOTE — Patient Instructions (Addendum)
Urticaria/elevated serum tryptase  A laboratory order form has been provided for c-kit mutation and 24 hour urine histamine, 24 hour urine PGD-2, and 24 hour 11B-protaglandin F2a (11 beta, PGD F2 alpha)  Referral to dermatology for skin biopsy.  Continue H1/H2 receptor blockade, titrating to the lowest effective dose necessary to suppress urticaria.  For any symptoms concerning for anaphylaxis, administer epinephrine and called 911 immediately.  A prescription has been provided for epinephrine 0.3 mg autoinjector 2 pack along with instructions for its proper administration.  Perennial allergic rhinitis Stable.  Continue appropriate allergen avoidance measures, levocetirizine, and Nasonex as needed.   When lab results have returned the patient will be called with further recommendations and follow up instructions.

## 2016-05-14 NOTE — Assessment & Plan Note (Addendum)
   A laboratory order form has been provided for c-kit mutation and 24 hour urine histamine, 24 hour urine PGD-2, and 24 hour 11B-protaglandin F2a (11 beta, PGD F2 alpha)  Referral to dermatology for skin biopsy.  Continue H1/H2 receptor blockade, titrating to the lowest effective dose necessary to suppress urticaria.  For any symptoms concerning for anaphylaxis, administer epinephrine and called 911 immediately.  A prescription has been provided for epinephrine 0.3 mg autoinjector 2 pack along with instructions for its proper administration.

## 2016-05-14 NOTE — Telephone Encounter (Signed)
The patient needs a dermatology referral for a skin biopsy.  She has requested that this procedure be done before the end of the year because she has reached her out-of-pocket deductible.

## 2016-05-14 NOTE — Progress Notes (Signed)
Follow-up Note  RE: [name] MRN: [id number] DOB: 06-28-1950 Date of Office Visit: 05/14/2016  Primary care provider: [name], MD Referring provider: Kathyrn Drown, MD  History of present illness: [name] is a 65 y.o. female with recurrent urticaria and angioedema presenting today for follow up.  She is previously seen in this clinic for her initial evaluation on 04/16/2016.  She reports that her urticaria and angioedema have improved significantly after having started daily levocetirizine and ranitidine.  She still has occasional "outbreaks" of hives on her neck, though these are minor relative to previous urticaria/angioedema.  She has not had symptoms consistent with anaphylaxis.  She has had 2 elevated tryptase levels, 20.1 g/L and 21.7 g/L.  Her nasal symptoms are controlled with levocetirizine and Nasonex as needed.  She has no nasal symptom complaints today.   Assessment and plan: Urticaria/elevated serum tryptase  A laboratory order form has been provided for c-kit mutation and 24 hour urine histamine, 24 hour urine PGD-2, and 24 hour 11B-protaglandin F2a (11 beta, PGD F2 alpha)  Referral to dermatology for skin biopsy.  Continue H1/H2 receptor blockade, titrating to the lowest effective dose necessary to suppress urticaria.  For any symptoms concerning for anaphylaxis, administer epinephrine and called 911 immediately.  A prescription has been provided for epinephrine 0.3 mg autoinjector 2 pack along with instructions for its proper administration.  Perennial allergic rhinitis Stable.  Continue appropriate allergen avoidance measures, levocetirizine, and Nasonex as needed.   Meds ordered this encounter  Medications  . EPINEPHrine (EPIPEN 2-PAK) 0.3 mg/0.3 mL IJ SOAJ injection    Sig: Inject 0.3 mLs (0.3 mg total) into the muscle once.    Dispense:  2 Device    Refill:  0    Dispense generic Mylan if brand is denied     Diagnostics: Tryptase level 3 weeks ago: 20.1 g/L Tryptase level 13 days ago: 21.7 g/L Darier's sign: Indeterminate    Physical examination: Blood pressure 110/70, pulse 81, temperature 98.2 F (36.8 C), temperature source Oral, resp. rate 16, height 5' 2.5" (1.588 m), weight 156 lb 3.2 oz (70.9 kg), SpO2 98 %.  General: Alert, interactive, in no acute distress. HEENT: TMs pearly gray, turbinates mildly edematous without discharge, post-pharynx mildly erythematous. Neck: Supple without lymphadenopathy. Lungs: Clear to auscultation without wheezing, rhonchi or rales. CV: Normal S1, S2 without murmurs. Skin: Erythematous, slightly raised patches on the anterior neck.  The following portions of the patient's history were reviewed and updated as appropriate: allergies, current medications, past family history, past medical history, past social history, past surgical history and problem list.    Medication List       Accurate as of 05/14/16  7:31 PM. Always use your most recent med list.          alendronate 70 MG tablet Commonly known as:  FOSAMAX TAKE 1 TAB WITH A FULL GLASS OF WATER ON EMPTY STOMACH AND SIT UPRIGHT FOR 30 MINS EVERY 7 DAYS   Biotin 5000 MCG Caps Take 5,000 mcg by mouth daily.   clonazePAM 0.5 MG tablet Commonly known as:  KLONOPIN TAKE 1 TABLET BY MOUTH TWO TIMES DAILY AS NEEDED FOR ANXEITY   EPINEPHrine 0.3 mg/0.3 mL Soaj injection Commonly known as:  EPIPEN 2-PAK Inject 0.3 mLs (0.3 mg total) into the muscle once.   levocetirizine 5 MG tablet Commonly known as:  XYZAL Take 1 tablet (5 mg total) by mouth every evening.   mometasone 50 MCG/ACT nasal spray  Commonly known as:  NASONEX Place 2 sprays into the nose daily. Two sprays each in each nostril   montelukast 10 MG tablet Commonly known as:  SINGULAIR Take 1 tablet (10 mg total) by mouth at bedtime.   multivitamin with minerals Tabs tablet Take 1 tablet by mouth daily.   Olopatadine  HCl 0.2 % Soln Commonly known as:  PATADAY Place 1 drop into both eyes 1 day or 1 dose.   PSYLLIUM PO Take 6 capsules by mouth at bedtime.   ranitidine 150 MG tablet Commonly known as:  ZANTAC One tablet one to two times a day as directed   rizatriptan 10 MG disintegrating tablet Commonly known as:  MAXALT-MLT TAKE ONE TABLET BY MOUTH AT ONSET OF MIGRAINE MAY REPEAT IN 2 HOURS IF NEEDED MAX 2 PER 24 HOURS   THERATEARS OP Place 5 drops into both eyes daily.   topiramate 100 MG tablet Commonly known as:  TOPAMAX TAKE 1 TABLET (100 MG TOTAL) BY MOUTH 2 (TWO) TIMES DAILY.   vitamin C 500 MG tablet Commonly known as:  ASCORBIC ACID Take 500 mg by mouth 2 (two) times daily.       Allergies  Allergen Reactions  . Beta Adrenergic Blockers Shortness Of Breath  . Ambien [Zolpidem Tartrate] Other (See Comments)    Hallucinations  . Biaxin [Clarithromycin] Hives    May have caused hives see 06/19/15   Review of systems: Review of systems negative except as noted in HPI / PMHx or noted below: Constitutional: Negative.  HENT: Negative.   Eyes: Negative.  Respiratory: Negative.   Cardiovascular: Negative.  Gastrointestinal: Negative.  Genitourinary: Negative.  Musculoskeletal: Negative.  Neurological: Negative.  Endo/Heme/Allergies: Negative.  Cutaneous: Negative.  Past Medical History:  Diagnosis Date  . Acid reflux   . Allergy   . Complete heart block ([place]) 09/2015   a. s/p St. Jude PPM 09/2015.  Marland Kitchen Former tobacco use   . Hyperlipidemia   . Migraines    Chronic  . PAT (paroxysmal atrial tachycardia) ([place])   . Presence of permanent cardiac pacemaker 09/19/2015  . Sinusitis   . Urticaria     Family History  Problem Relation Age of Onset  . Congestive Heart Failure Mother   . Diabetes Mother   . Dementia Mother   . Parkinson's disease Mother   . Lung cancer Father   . Diabetes Sister   . Diabetes Maternal Grandmother   . Allergic rhinitis Maternal Grandmother    . Asthma Maternal Grandmother   . Cancer Maternal Grandfather   . Diabetes Paternal Grandmother   . Heart attack Paternal Grandmother   . Heart disease Paternal Grandmother   . Asthma Paternal Grandmother   . Angioedema Neg Hx   . Eczema Neg Hx   . Urticaria Neg Hx     Social History   Social History  . Marital status: Married    Spouse name: N/A  . Number of children: N/A  . Years of education: N/A   Occupational History  . Not on file.   Social History Main Topics  . Smoking status: Former Research scientist (life sciences)  . Smokeless tobacco: Former Systems developer    Quit date: 01/06/2007  . Alcohol use No  . Drug use: No  . Sexual activity: Yes    Partners: Male    Birth control/ protection: Surgical   Other Topics Concern  . Not on file   Social History Narrative  . No narrative on file    I appreciate the  opportunity to take part in [name]'s care. Please do not hesitate to contact me with questions.  Sincerely,   R. Edgar Frisk, MD

## 2016-05-15 ENCOUNTER — Telehealth: Payer: Self-pay | Admitting: Allergy and Immunology

## 2016-05-15 NOTE — Telephone Encounter (Signed)
Spoke to [place], [place] in regards to this. She states that she mailed them yesterday to pt. Called pt and advised her that they will be coming in the mail.

## 2016-05-15 NOTE — Telephone Encounter (Signed)
Please refer. Thank you.

## 2016-05-15 NOTE — Telephone Encounter (Signed)
She went [facility] to have blood drawn but she didn't have a form to have them done. Was she supposed to take one? She didn't get them done.  Please call her at [phone]. If you cant get her there please call her at work [phone].

## 2016-05-22 ENCOUNTER — Telehealth: Payer: Self-pay | Admitting: Allergy and Immunology

## 2016-05-22 NOTE — Telephone Encounter (Signed)
She went to the Dermatologist yesterday and was told to call back here and speak with a nurse. She was supposed to have a biopsy on her neck but when she got there it was cleared up and the PA looked at her neck and chin and told her that she didn't have enough break out to be able to do a biopsy. She would like to speak with a nurse please. Please call her at [phone] and if you cant get her at this number please call [phone]. This is her work phone number.

## 2016-05-23 NOTE — Telephone Encounter (Signed)
If patient needs additional test patient would like to get everything done next week if possible. Patient completed 24-urine yesterday.

## 2016-05-23 NOTE — Telephone Encounter (Signed)
Noted. We will probably not have test results until next week and I will not be back in the office until next Wednesday but will do my best to order any additional tests before the end of the year if needed and if possible.

## 2016-05-29 LAB — HISTAMINE, 24 HOUR URINE
Creatinine, 24 Hr: 0.7 g/(24.h) (ref 0.63–2.50)
Histamine, 24 hour Ur: 0.008 mg/24 h (ref 0.006–0.131)

## 2016-05-30 LAB — C-KIT MUTATION ANALYSIS, CELL-BASED: C-KIT Mutation,Cell-based: NOT DETECTED

## 2016-05-30 LAB — URINE 2,3-DINOR-11 B-PROSTAGLANDIN F2A: 2,3-dinor11B-Prostagl F2a: 1280 pg/mg Cr (ref <5205)

## 2016-06-07 NOTE — Telephone Encounter (Signed)
Patient called and said she had called last week to get test results and had not heard anything yet and was wondering if she could get those results.

## 2016-06-08 LAB — PROSTAGLANDINS D2, URINE

## 2016-06-11 ENCOUNTER — Telehealth: Payer: Self-pay | Admitting: Allergy and Immunology

## 2016-06-11 NOTE — Telephone Encounter (Signed)
Patient had testing done 05-23-16 and would like to know the results. Said she has called two other times and never got a return call and she was wondering if she needed to go have the testing done again.

## 2016-06-11 NOTE — Telephone Encounter (Signed)
Left message that we got her results back and that we are waiting on the Dr to review them.

## 2016-06-13 ENCOUNTER — Telehealth: Payer: Self-pay | Admitting: Allergy and Immunology

## 2016-06-13 NOTE — Telephone Encounter (Signed)
Pt husband called and said that she had blood work done [employer].21 and has not heard anything, she would like for you to call her [phone] cell or [phone] work.

## 2016-06-13 NOTE — Telephone Encounter (Signed)
I spoke with the patient on the telephone this afternoon and informed her that all laboratory results were negative.  I have encouraged her to reattempt seeing the dermatologist for a skin biopsy. If unable to get a skin biopsy we may have to consider an intestinal biopsy or bone marrow biopsy.  I have also asked her to see her gynecologist for an examination and possible ultrasound of her one remaining ovary as recurrent angioedema has been associated with ovarian carcinoma.

## 2016-06-17 ENCOUNTER — Telehealth: Payer: Self-pay | Admitting: Family Medicine

## 2016-06-17 DIAGNOSIS — T783XXA Angioneurotic edema, initial encounter: Secondary | ICD-10-CM

## 2016-06-17 NOTE — Telephone Encounter (Signed)
Pt is needing a pelvic ultrasound to rule out  A mass on her ovary. Pt was told she needed this by her allergist and he is unable to order it. Pt is wanting to know what she needs to do to get that scheduled.

## 2016-06-17 NOTE — Telephone Encounter (Signed)
Pelvic ultrasound to look at her ovary-apparently has 1 left-reason for this is per request of allergist stating that angioedema can be associated with ovarian cancer. Annual female wellness checkup recommended as well

## 2016-06-18 NOTE — Telephone Encounter (Signed)
Pelvic ultrasound with transvaginal at [facility] 06/25/16 at 1:15pm-full bladder-Patient notified and verbalized understanding.

## 2016-06-25 ENCOUNTER — Ambulatory Visit (HOSPITAL_COMMUNITY)
Admission: RE | Admit: 2016-06-25 | Discharge: 2016-06-25 | Disposition: A | Discharge status: Home / Self Care | Payer: 59 | Source: Ambulatory Visit | Attending: Family Medicine | Admitting: Family Medicine

## 2016-06-25 DIAGNOSIS — T783XXA Angioneurotic edema, initial encounter: Secondary | ICD-10-CM | POA: Diagnosis not present

## 2016-06-25 DIAGNOSIS — X58XXXA Exposure to other specified factors, initial encounter: Secondary | ICD-10-CM | POA: Insufficient documentation

## 2016-06-25 DIAGNOSIS — Z90721 Acquired absence of ovaries, unilateral: Secondary | ICD-10-CM | POA: Insufficient documentation

## 2016-06-26 ENCOUNTER — Telehealth: Payer: Self-pay | Admitting: Allergy and Immunology

## 2016-06-26 NOTE — Telephone Encounter (Signed)
Pt called and said that you wanted her to call when she had lab done.[phone].

## 2016-06-26 NOTE — Telephone Encounter (Signed)
I spoke with her on Jan 11th, letting her know that her labs were within normal limits. She was asked to have a pelvic ultrasound done. I'm confused about the call.

## 2016-06-27 NOTE — Telephone Encounter (Signed)
She want to talk with you about the bone marrow biopsy and the ultrsound .[phone].

## 2016-07-07 ENCOUNTER — Other Ambulatory Visit: Payer: Self-pay | Admitting: Family Medicine

## 2016-07-08 NOTE — Telephone Encounter (Signed)
May have this +2 refills 

## 2016-07-08 NOTE — Telephone Encounter (Signed)
I spoke with her about her labs and the next steps in her evaluation.

## 2016-07-10 ENCOUNTER — Other Ambulatory Visit: Payer: Self-pay | Admitting: Family Medicine

## 2016-07-13 NOTE — Telephone Encounter (Signed)
May have this +2 additional refills will need follow-up office visit later in the spring

## 2016-07-15 NOTE — Telephone Encounter (Signed)
Please see previous

## 2016-07-16 ENCOUNTER — Ambulatory Visit (INDEPENDENT_AMBULATORY_CARE_PROVIDER_SITE_OTHER): Payer: 59 | Admitting: *Deleted

## 2016-07-16 DIAGNOSIS — I442 Atrioventricular block, complete: Secondary | ICD-10-CM | POA: Diagnosis not present

## 2016-07-16 NOTE — Progress Notes (Signed)
Remote pacemaker transmission.   

## 2016-07-17 ENCOUNTER — Encounter: Payer: Self-pay | Admitting: Cardiology

## 2016-07-19 LAB — CUP PACEART REMOTE DEVICE CHECK
Battery Remaining Longevity: 109 mo
Battery Remaining Percentage: 95.5 %
Brady Statistic AP VS Percent: 1 %
Brady Statistic AS VS Percent: 24 %
Date Time Interrogation Session: 20180213070014
Implantable Lead Implant Date: 20170418
Implantable Lead Location: 753859
Lead Channel Impedance Value: 560 Ohm
Lead Channel Pacing Threshold Amplitude: 1 V
Lead Channel Pacing Threshold Pulse Width: 0.5 ms
Lead Channel Sensing Intrinsic Amplitude: 5 mV
Lead Channel Setting Pacing Pulse Width: 0.5 ms
MDC IDC LEAD IMPLANT DT: 20170418
MDC IDC LEAD LOCATION: 753860
MDC IDC MSMT BATTERY VOLTAGE: 3.01 V
MDC IDC MSMT LEADCHNL RA PACING THRESHOLD AMPLITUDE: 0.75 V
MDC IDC MSMT LEADCHNL RA PACING THRESHOLD PULSEWIDTH: 0.5 ms
MDC IDC MSMT LEADCHNL RV IMPEDANCE VALUE: 480 Ohm
MDC IDC MSMT LEADCHNL RV SENSING INTR AMPL: 3.6 mV
MDC IDC PG IMPLANT DT: 20170418
MDC IDC SET LEADCHNL RA PACING AMPLITUDE: 2 V
MDC IDC SET LEADCHNL RV PACING AMPLITUDE: 2.5 V
MDC IDC SET LEADCHNL RV SENSING SENSITIVITY: 2 mV
MDC IDC STAT BRADY AP VP PERCENT: 1 %
MDC IDC STAT BRADY AS VP PERCENT: 75 %
MDC IDC STAT BRADY RA PERCENT PACED: 1 %
MDC IDC STAT BRADY RV PERCENT PACED: 76 %
Pulse Gen Model: 2272
Pulse Gen Serial Number: 7882213

## 2016-08-12 ENCOUNTER — Encounter: Payer: 59 | Admitting: Oncology

## 2016-08-19 ENCOUNTER — Ambulatory Visit (INDEPENDENT_AMBULATORY_CARE_PROVIDER_SITE_OTHER): Payer: 59 | Admitting: Oncology

## 2016-08-19 ENCOUNTER — Encounter: Payer: Self-pay | Admitting: Oncology

## 2016-08-19 VITALS — BP 116/43 | HR 74 | Temp 97.5°F | Ht 64.5 in | Wt 165.9 lb

## 2016-08-19 DIAGNOSIS — Z8489 Family history of other specified conditions: Secondary | ICD-10-CM

## 2016-08-19 DIAGNOSIS — Z87891 Personal history of nicotine dependence: Secondary | ICD-10-CM | POA: Diagnosis not present

## 2016-08-19 DIAGNOSIS — Z820 Family history of epilepsy and other diseases of the nervous system: Secondary | ICD-10-CM | POA: Diagnosis not present

## 2016-08-19 DIAGNOSIS — Z825 Family history of asthma and other chronic lower respiratory diseases: Secondary | ICD-10-CM

## 2016-08-19 DIAGNOSIS — Z801 Family history of malignant neoplasm of trachea, bronchus and lung: Secondary | ICD-10-CM

## 2016-08-19 DIAGNOSIS — Z833 Family history of diabetes mellitus: Secondary | ICD-10-CM | POA: Diagnosis not present

## 2016-08-19 DIAGNOSIS — Z950 Presence of cardiac pacemaker: Secondary | ICD-10-CM

## 2016-08-19 DIAGNOSIS — Z881 Allergy status to other antibiotic agents status: Secondary | ICD-10-CM

## 2016-08-19 DIAGNOSIS — Z888 Allergy status to other drugs, medicaments and biological substances status: Secondary | ICD-10-CM

## 2016-08-19 DIAGNOSIS — Z8679 Personal history of other diseases of the circulatory system: Secondary | ICD-10-CM

## 2016-08-19 DIAGNOSIS — L509 Urticaria, unspecified: Secondary | ICD-10-CM

## 2016-08-19 DIAGNOSIS — Z79899 Other long term (current) drug therapy: Secondary | ICD-10-CM | POA: Diagnosis not present

## 2016-08-19 DIAGNOSIS — Z8249 Family history of ischemic heart disease and other diseases of the circulatory system: Secondary | ICD-10-CM

## 2016-08-19 LAB — SAVE SMEAR

## 2016-08-19 MED ORDER — CROMOLYN SODIUM 5.2 MG/ACT NA AERS: 1.0000 | INHALATION_SPRAY | Freq: Four times a day (QID) | NASAL | 3 refills | Status: DC

## 2016-08-19 NOTE — Patient Instructions (Signed)
To lab today Return visit 2-3 months

## 2016-08-20 LAB — C1 ESTERASE INHIBITOR: C1INH SerPl-mCnc: 29 mg/dL (ref 21–39)

## 2016-08-20 NOTE — Progress Notes (Signed)
New Patient Hematology   [name] [id number] 1950-10-26 66 y.o. 08/20/2016  CC: Dr. [name]; Dr. Rosezetta Schlatter; Dr Golda Acre   Reason for referral: Evaluate for possible systemic mastocytosis   HPI:  Pleasant 66 year old woman who has had lifelong problems with allergies and intermittent urticaria.  In September 2017 when she was at the beach she had a acute episode of facial and neck swelling and erythema which did not improve with Benadryl, minimal improvement with steroids.  She was started on H1 and H2 blockers in October 2017.  (fenfoxidine and Zantac).  She was referred to an allergist Dr. Golda Acre.  Impression was allergic urticaria/angioedema.  Food allergen skin tests were negative with a positive histamine control.  In November 2017 levocetirizine 5 mg daily as needed and montelukast 10 mg at bedtime were initiated.  An EpiPen was prescribed for use in the event of an acute anaphylactic reaction.  Nasonex nasal spray also prescribed. Laboratory studies done included a tryptase initial value 20.1, normal less than 11 mcg/L on November 16.  Follow-up value after symptoms subsided on November 29 remained elevated at 21.7.  ANA negative.  C4 complement normal at 28 (16-47 mg percent), ESR 1 mm, alpha galactose for beef and pork allergy upper normal at 0.35.  TSH 1.45.  Shellfish panel not reactive. Liver chemistries normal.  Total protein 6.1.  Albumin 3.9.  Bilirubin 0.3.  AST 17.  ALT 10. Alkaline phosphatase 54  Renal function normal with BUN 18, creatinine 0.9.  No recent urinalysis but urine sample in April 2017 was negative for protein.  .  CBC normal with hemoglobin 14, hematocrit 43, MCV 97, white count 5900, slight shift to the right 41 neutrophils, 48 lymphocytes, 7 monocytes, 4 eosinophils, 0 basophils, platelet count 250,000.  Eosinophils have never been more than 8% of the differential.  Other white count differentials have not shown lymphocytosis. 24 hour  urine histamines not elevated.  Urine prostaglandins not elevated. Peripheral blood sent for C-KIT mutation analysis and showed no mutations in gene exons 8, 9, 11, 13, and 17 done on May 22, 2016. Statistician labs -sent to [facility] in [place]).  I do not know the specifics of this analysis and I do not see that an assay for the most important mutation in systemic mastocytosis in the D816V codon was assessed.  In April 2017 she developed symptomatic bradycardia with a syncopal episode and was found to be in complete heart block and had to have an urgent pacemaker placed on April 18.  She continues to have intermittent cutaneous eruptions now more severe than in the past.  She is developed progressive profound fatigue, significant decrease in exercise tolerance, a 20 pound weight gain, intermittent dyspnea, total body discomfort which she describes as a deep achiness right down to the bones, small and large joint pain, right upper quadrant abdominal pain, easy bruisability, dysesthesias of the soles of her feet over the last month, increased frequency of chronic migraine headaches.  Severe constipation with bowel movements on the average of every 5 days despite laxative use.  She was formerly very active and now struggling to do routine activities of daily living.  There is no history of hepatitis, yellow jaundice, mononucleosis, ulcers, thyroid disease, seizure, stroke, or diabetes.  Her sister had a severe allergic reaction felt to be secondary to mold in a [place] where she was swimming when she was 66 years old.  She has 5 brothers who are alive and  well.  No one else in the family have had problems with allergies.  Father died at age 10 of complications of lung cancer.  Mother died age 53 complications of Lewy body dementia and diabetes.  A paternal grandmother had unspecified allergies.  A 22 year old son is healthy.  A 103 year old daughter has insulin-dependent diabetes which developed  5 years ago.  PMH: Past Medical History:  Diagnosis Date  . Acid reflux   . Allergy   . Complete heart block ([place]) 09/2015   a. s/p St. Jude PPM 09/2015.  Marland Kitchen Former tobacco use   . Hyperlipidemia   . Migraines    Chronic  . PAT (paroxysmal atrial tachycardia) ([place])   . Presence of permanent cardiac pacemaker 09/19/2015  . Sinusitis   . Urticaria     Past Surgical History:  Procedure Laterality Date  . APPENDECTOMY    . BREAST ENHANCEMENT SURGERY  1985  . CHOLECYSTECTOMY    . COLONOSCOPY    . EP IMPLANTABLE DEVICE N/A 09/19/2015   Procedure: Pacemaker Implant;  Surgeon: Will Meredith Leeds, MD;  Location: [facility];  Service: Cardiovascular;  Laterality: N/A;  . TONSILLECTOMY AND ADENOIDECTOMY    . TUBAL LIGATION      Allergies: Allergies  Allergen Reactions  . Beta Adrenergic Blockers Shortness Of Breath  . Ambien [Zolpidem Tartrate] Other (See Comments)    Hallucinations  . Biaxin [Clarithromycin] Hives    May have caused hives see 06/19/15    Medications:  Current Outpatient Prescriptions:  .  alendronate (FOSAMAX) 70 MG tablet, TAKE 1 TAB WITH A FULL GLASS OF WATER ON EMPTY STOMACH AND SIT UPRIGHT FOR 30 MINS EVERY 7 DAYS, Disp: 12 tablet, Rfl: 1 .  Biotin 5000 MCG CAPS, Take 5,000 mcg by mouth daily., Disp: , Rfl:  .  Carboxymethylcellulose Sodium (THERATEARS OP), Place 5 drops into both eyes daily., Disp: , Rfl:  .  clonazePAM (KLONOPIN) 0.5 MG tablet, TAKE 1 TABLET BY MOUTH TWICE A DAY AS NEEDED FOR ANXIETY, Disp: 40 tablet, Rfl: 2 .  cromolyn (NASALCROM) 5.2 MG/ACT nasal spray, Place 1 spray into both nostrils 4 (four) times daily., Disp: 26 mL, Rfl: 3 .  levocetirizine (XYZAL) 5 MG tablet, Take 1 tablet (5 mg total) by mouth every evening., Disp: 30 tablet, Rfl: 5 .  mometasone (NASONEX) 50 MCG/ACT nasal spray, Place 2 sprays into the nose daily. Two sprays each in each nostril, Disp: 17 g, Rfl: 5 .  montelukast (SINGULAIR) 10 MG tablet, Take 1 tablet  (10 mg total) by mouth at bedtime., Disp: 30 tablet, Rfl: 5 .  Multiple Vitamin (MULTIVITAMIN WITH MINERALS) TABS tablet, Take 1 tablet by mouth daily., Disp: , Rfl:  .  Olopatadine HCl (PATADAY) 0.2 % SOLN, Place 1 drop into both eyes 1 day or 1 dose., Disp: 1 Bottle, Rfl: 5 .  PSYLLIUM PO, Take 6 capsules by mouth at bedtime., Disp: , Rfl:  .  ranitidine (ZANTAC) 150 MG tablet, One tablet one to two times a day as directed, Disp: 60 tablet, Rfl: 5 .  rizatriptan (MAXALT-MLT) 10 MG disintegrating tablet, TAKE ONE TABLET BY MOUTH AT ONSET OF MIGRAINE MAY REPEAT IN 2 HOURS IF NEEDED MAX 2 PER 24 HOURS, Disp: 18 tablet, Rfl: 5 .  topiramate (TOPAMAX) 100 MG tablet, TAKE 1 TABLET (100 MG TOTAL) BY MOUTH 2 (TWO) TIMES DAILY., Disp: 180 tablet, Rfl: 1 .  vitamin C (ASCORBIC ACID) 500 MG tablet, Take 500 mg by mouth 2 (two) times  daily., Disp: , Rfl:   Social History: Married and her husband accompanies her today.  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  she has quit smoking. She quit smokeless tobacco use about 9 years ago. She reports that she does not drink alcohol or use drugs.  Family History: Family History  Problem Relation Age of Onset  . Congestive Heart Failure Mother   . Diabetes Mother   . Dementia Mother   . Parkinson's disease Mother   . Lung cancer Father   . Diabetes Sister   . Diabetes Maternal Grandmother   . Allergic rhinitis Maternal Grandmother   . Asthma Maternal Grandmother   . Cancer Maternal Grandfather   . Diabetes Paternal Grandmother   . Heart attack Paternal Grandmother   . Heart disease Paternal Grandmother   . Asthma Paternal Grandmother   . Angioedema Neg Hx   . Eczema Neg Hx   . Urticaria Neg Hx     Review of Systems: See HPI Remaining ROS negative.  Physical Exam: Blood pressure (!) 116/43, pulse 74, temperature 97.5 F (36.4 C), temperature source Oral, height 5' 4.5" (1.638 m), weight 165 lb 14.4 oz (75.3 kg), SpO2 99  %. Wt Readings from Last 3 Encounters:  08/19/16 165 lb 14.4 oz (75.3 kg)  05/14/16 156 lb 3.2 oz (70.9 kg)  04/16/16 157 lb (71.2 kg)     General appearance: Well-nourished Caucasian woman HENNT: Pharynx no erythema, exudate, mass, or ulcer. No thyromegaly or thyroid nodules Lymph nodes: No cervical, supraclavicular, or axillary lymphadenopathy Breasts: Lungs: Clear to auscultation, resonant to percussion throughout Heart: Regular rhythm, no murmur, no gallop, no rub, no click, no edema Abdomen: Soft, nontender, normal bowel sounds, no mass, no organomegaly Extremities: No edema, no calf tenderness Musculoskeletal: no joint deformities GU:  Vascular: Carotid pulses 2+, no bruits, distal pulses: Dorsalis pedis 1+ symmetric Neurologic: Alert, oriented, PERRLA, optic discs sharp and vessels normal, no hemorrhage or exudate, cranial nerves grossly normal, motor strength 5 over 5, reflexes 1+ symmetric, upper body coordination normal, gait normal, Skin: Diffuse erythema involving her anterior neck.  See photograph in chart.  No urticaria at present.  No typical urticaria pigmentosa rash.    Lab Results: White count differential: 43 neutrophils, 39 lymphocytes, 9 monocytes, 1 basophil, 8 eosinophils, Lab Results:  Component Value Date   WBC 7.5 08/19/2016   HGB 13.7 04/18/2016   HCT 43.7 08/19/2016   MCV 94 08/19/2016   PLT 249 08/19/2016  ESR 4 mm   Chemistry      Component Value Date/Time   NA 139 04/18/2016 1615   NA 142 04/07/2015 1509   K 4.0 04/18/2016 1615   CL 107 04/18/2016 1615   CO2 25 04/18/2016 1615   BUN 18 04/18/2016 1615   BUN 14 04/07/2015 1509   CREATININE 0.92 04/18/2016 1615      Component Value Date/Time   CALCIUM 8.8 04/18/2016 1615   ALKPHOS 54 04/18/2016 1615   AST 17 04/18/2016 1615   ALT 10 04/18/2016 1615   BILITOT 0.3 04/18/2016 1615   BILITOT 0.2 04/07/2015 1509     LDH: 110 IgG 600 mg percent ([phone]); IgA 160; IgM 77.  IFE: no  monoclonal proteins Kappa/lambda serum free light chains normal with normal ratio 0.99 IgE pending C1 esterase inhibitor: 29 (21-39 mg%)  Review of peripheral blood film: Normochromic normocytic red cells.  No spherocytes, schistocytes, or inclusions.  Mixed population of white cells: Mature neutrophils, mature lymphocytes with a  subpopulation of benign reactive lymphocytes, increased benign appearing eosinophils, a single basophil noted, platelets normal in number and morphology with an occasional large platelet.  Radiological Studies: No results found.    Impression: Idiopathic allergic/immune condition with symptoms in multiple organ systems. No clear unifying diagnosis at present.  Borderline elevation of tryptase levels suggestive but not by itself diagnostic of systemic mastocytosis, (meets 1 minor criteria).  She meets other minor criteria as well with clinical symptoms in the pulmonary and GI tracts as well as skin and possibly cardiac.  Certainly no signs of infiltrative marrow disease with normal hemoglobin, total white count, and platelet count.  Shift to the right with increased lymphocytes on some differentials.  Eosinophils never higher than 8% on any differential.  ESR low. Although bone marrow biopsy will help clarify whether or not this is in fact systemic mastocytosis, before I put her through an invasive procedure, I would like to discuss her case with our hematopathologist to make sure we can get all of the important studies necessary from the bone marrow and I would like to get the advice of a mastocytosis expert.  If it is in fact mastocytosis, there have been some recent advances in the field recently presented at our December 2017 annual hematology meeting with a drug called [id number], which I believe is the same drug with phase 3 clinical trial data already published with the name of Masitinib, a small molecule inhibitor of the c-KIT gene product. Pending further advice from  an expert, I am adding nebulized cromolyn to her regimen.  This is supposed to be a mast cell stabilizing agent.  Her allergist recommended a skin biopsy when she has a active lesion.  Her skin rash today would be something we could target. Her allergist is also considering referral to a mastocytosis expert, a Dr. Finis Bud in [place].  This may be more convenient for her than going to the [facility] in [place] for a consultation with Dr. Posey Boyer or to [place], [place], to see Dr Carita Pian.  I recently had the opportunity to hear a lecture on eosinophilia and mastocytosis by Dr. Benjaman Lobe at a consultative hematology course as part of the annual [facility] of hematology meeting in December 2017.  Dr. Posey Boyer presented a paper in the Plenary Session with clinical trial results on [id number] a KIT Inhibitor drug in development.   Recommendation: See discussion above    Murriel Hopper, MD, [facility]  Hematology-Oncology/Internal Medicine  08/20/2016, 5:03 PM

## 2016-08-23 ENCOUNTER — Other Ambulatory Visit: Payer: Self-pay | Admitting: Oncology

## 2016-08-23 DIAGNOSIS — T7840XD Allergy, unspecified, subsequent encounter: Secondary | ICD-10-CM

## 2016-08-23 DIAGNOSIS — T783XXA Angioneurotic edema, initial encounter: Secondary | ICD-10-CM

## 2016-08-23 DIAGNOSIS — L509 Urticaria, unspecified: Secondary | ICD-10-CM

## 2016-08-25 LAB — CBC WITH DIFFERENTIAL/PLATELET
BASOS: 1 %
Basophils Absolute: 0 10*3/uL (ref 0.0–0.2)
EOS (ABSOLUTE): 0.6 10*3/uL — ABNORMAL HIGH (ref 0.0–0.4)
EOS: 8 %
HEMATOCRIT: 43.7 % (ref 34.0–46.6)
HEMOGLOBIN: 14.4 g/dL (ref 11.1–15.9)
Immature Grans (Abs): 0 10*3/uL (ref 0.0–0.1)
Immature Granulocytes: 0 %
LYMPHS ABS: 2.9 10*3/uL (ref 0.7–3.1)
Lymphs: 39 %
MCH: 31 pg (ref 26.6–33.0)
MCHC: 33 g/dL (ref 31.5–35.7)
MCV: 94 fL (ref 79–97)
MONOCYTES: 9 %
MONOS ABS: 0.6 10*3/uL (ref 0.1–0.9)
Neutrophils Absolute: 3.3 10*3/uL (ref 1.4–7.0)
Neutrophils: 43 %
Platelets: 249 10*3/uL (ref 150–379)
RBC: 4.64 x10E6/uL (ref 3.77–5.28)
RDW: 14.2 % (ref 12.3–15.4)
WBC: 7.5 10*3/uL (ref 3.4–10.8)

## 2016-08-25 LAB — IMMUNOFIXATION ELECTROPHORESIS
IGA/IMMUNOGLOBULIN A, SERUM: 160 mg/dL (ref 87–352)
IGG (IMMUNOGLOBIN G), SERUM: 600 mg/dL — AB (ref 700–1600)
IgM (Immunoglobulin M), Srm: 77 mg/dL (ref 26–217)
TOTAL PROTEIN: 6.4 g/dL (ref 6.0–8.5)

## 2016-08-25 LAB — IGE: IgE (Immunoglobulin E), Serum: 28 IU/mL (ref 0–100)

## 2016-08-25 LAB — KAPPA/LAMBDA LIGHT CHAINS
IG LAMBDA FREE LIGHT CHAIN: 18.6 mg/L (ref 5.7–26.3)
Ig Kappa Free Light Chain: 18.4 mg/L (ref 3.3–19.4)
KAPPA/LAMBDA FLC RATIO: 0.99 (ref 0.26–1.65)

## 2016-08-25 LAB — SEDIMENTATION RATE: Sed Rate: 4 mm/hr (ref 0–40)

## 2016-08-25 LAB — LACTATE DEHYDROGENASE: LDH: 110 IU/L — AB (ref 119–226)

## 2016-08-27 ENCOUNTER — Telehealth: Payer: Self-pay | Admitting: Family Medicine

## 2016-08-27 ENCOUNTER — Other Ambulatory Visit: Payer: Self-pay | Admitting: *Deleted

## 2016-08-27 DIAGNOSIS — Z1231 Encounter for screening mammogram for malignant neoplasm of breast: Secondary | ICD-10-CM

## 2016-08-27 NOTE — Telephone Encounter (Signed)
Left message to return call to ask if she can go any day

## 2016-08-27 NOTE — Telephone Encounter (Signed)
Patient is requesting to have mammogram scheduled to have completed at [facility].

## 2016-08-27 NOTE — Telephone Encounter (Signed)
mammo scheduled aph April 2nd register 9:15. Pt notified of appt.

## 2016-09-02 ENCOUNTER — Encounter (HOSPITAL_COMMUNITY): Payer: Self-pay

## 2016-09-02 ENCOUNTER — Ambulatory Visit (HOSPITAL_COMMUNITY): Payer: 59

## 2016-09-02 ENCOUNTER — Ambulatory Visit (HOSPITAL_COMMUNITY)
Admission: RE | Admit: 2016-09-02 | Discharge: 2016-09-02 | Disposition: A | Discharge status: Home / Self Care | Payer: 59 | Source: Ambulatory Visit | Attending: Family Medicine | Admitting: Family Medicine

## 2016-09-02 ENCOUNTER — Other Ambulatory Visit: Payer: Self-pay | Admitting: Family Medicine

## 2016-09-02 DIAGNOSIS — Z1231 Encounter for screening mammogram for malignant neoplasm of breast: Secondary | ICD-10-CM | POA: Diagnosis present

## 2016-09-03 ENCOUNTER — Other Ambulatory Visit: Payer: Self-pay | Admitting: Student

## 2016-09-04 ENCOUNTER — Encounter (HOSPITAL_COMMUNITY): Payer: Self-pay

## 2016-09-04 ENCOUNTER — Ambulatory Visit (HOSPITAL_COMMUNITY)
Admission: RE | Admit: 2016-09-04 | Discharge: 2016-09-04 | Disposition: A | Discharge status: Home / Self Care | Payer: 59 | Source: Ambulatory Visit | Attending: Oncology | Admitting: Oncology

## 2016-09-04 ENCOUNTER — Other Ambulatory Visit: Payer: Self-pay | Admitting: Oncology

## 2016-09-04 ENCOUNTER — Inpatient Hospital Stay (HOSPITAL_COMMUNITY): Admission: RE | Admit: 2016-09-04 | Payer: Self-pay | Source: Ambulatory Visit

## 2016-09-04 DIAGNOSIS — L509 Urticaria, unspecified: Secondary | ICD-10-CM | POA: Diagnosis present

## 2016-09-04 DIAGNOSIS — E785 Hyperlipidemia, unspecified: Secondary | ICD-10-CM | POA: Diagnosis not present

## 2016-09-04 DIAGNOSIS — Z87891 Personal history of nicotine dependence: Secondary | ICD-10-CM | POA: Diagnosis not present

## 2016-09-04 DIAGNOSIS — Z801 Family history of malignant neoplasm of trachea, bronchus and lung: Secondary | ICD-10-CM | POA: Diagnosis not present

## 2016-09-04 DIAGNOSIS — Z888 Allergy status to other drugs, medicaments and biological substances status: Secondary | ICD-10-CM | POA: Insufficient documentation

## 2016-09-04 DIAGNOSIS — Z7983 Long term (current) use of bisphosphonates: Secondary | ICD-10-CM | POA: Insufficient documentation

## 2016-09-04 DIAGNOSIS — R7989 Other specified abnormal findings of blood chemistry: Secondary | ICD-10-CM | POA: Insufficient documentation

## 2016-09-04 DIAGNOSIS — Z950 Presence of cardiac pacemaker: Secondary | ICD-10-CM | POA: Diagnosis not present

## 2016-09-04 DIAGNOSIS — Z881 Allergy status to other antibiotic agents status: Secondary | ICD-10-CM | POA: Insufficient documentation

## 2016-09-04 DIAGNOSIS — Z79899 Other long term (current) drug therapy: Secondary | ICD-10-CM | POA: Insufficient documentation

## 2016-09-04 DIAGNOSIS — I471 Supraventricular tachycardia: Secondary | ICD-10-CM | POA: Insufficient documentation

## 2016-09-04 DIAGNOSIS — T7840XD Allergy, unspecified, subsequent encounter: Secondary | ICD-10-CM

## 2016-09-04 DIAGNOSIS — T783XXA Angioneurotic edema, initial encounter: Secondary | ICD-10-CM

## 2016-09-04 DIAGNOSIS — K219 Gastro-esophageal reflux disease without esophagitis: Secondary | ICD-10-CM | POA: Diagnosis not present

## 2016-09-04 DIAGNOSIS — Z8249 Family history of ischemic heart disease and other diseases of the circulatory system: Secondary | ICD-10-CM | POA: Insufficient documentation

## 2016-09-04 DIAGNOSIS — I442 Atrioventricular block, complete: Secondary | ICD-10-CM | POA: Diagnosis not present

## 2016-09-04 DIAGNOSIS — Z833 Family history of diabetes mellitus: Secondary | ICD-10-CM | POA: Diagnosis not present

## 2016-09-04 HISTORY — DX: Dyspnea, unspecified: R06.00

## 2016-09-04 LAB — PROTIME-INR
INR: 0.86
Prothrombin Time: 11.7 seconds (ref 11.4–15.2)

## 2016-09-04 LAB — CBC
HCT: 45 % (ref 36.0–46.0)
Hemoglobin: 15.1 g/dL — ABNORMAL HIGH (ref 12.0–15.0)
MCH: 30.9 pg (ref 26.0–34.0)
MCHC: 33.6 g/dL (ref 30.0–36.0)
MCV: 92.2 fL (ref 78.0–100.0)
Platelets: 247 10*3/uL (ref 150–400)
RBC: 4.88 MIL/uL (ref 3.87–5.11)
RDW: 14.1 % (ref 11.5–15.5)
WBC: 7.1 10*3/uL (ref 4.0–10.5)

## 2016-09-04 LAB — APTT: APTT: 26 s (ref 24–36)

## 2016-09-04 MED ORDER — SODIUM CHLORIDE 0.9 % IV SOLN
INTRAVENOUS | Status: DC
  Administered 2016-09-04: 09:00:00 via INTRAVENOUS

## 2016-09-04 MED ORDER — MIDAZOLAM HCL 2 MG/2ML IJ SOLN
INTRAMUSCULAR | Status: AC | PRN
  Administered 2016-09-04 (×2): 1 mg via INTRAVENOUS

## 2016-09-04 MED ORDER — MIDAZOLAM HCL 2 MG/2ML IJ SOLN
INTRAMUSCULAR | Status: AC
  Filled 2016-09-04: qty 6

## 2016-09-04 MED ORDER — FENTANYL CITRATE (PF) 100 MCG/2ML IJ SOLN
INTRAMUSCULAR | Status: AC
  Filled 2016-09-04: qty 2

## 2016-09-04 MED ORDER — FENTANYL CITRATE (PF) 100 MCG/2ML IJ SOLN
INTRAMUSCULAR | Status: AC | PRN
  Administered 2016-09-04 (×2): 25 ug via INTRAVENOUS
  Administered 2016-09-04: 50 ug via INTRAVENOUS

## 2016-09-04 NOTE — H&P (Signed)
Chief Complaint: Patient was seen in consultation today for mastocytosis  Referring Physician(s):  Motley M  Supervising Physician: Corrie Mckusick  Patient Status: [facility] - Out-pt  History of Present Illness: [name] is a 66 y.o. female acid reflux, dyspnea, HLD, and paroxysmal atrial tachycardia who presents with complaint of urticaria and elevated tryptase.   IR consulted for bone marrow biopsy at the request of Dr. Beryle Beams.   Patient has been NPO.  She does not take blood thinners.  She has been in her usual state of health.   Past Medical History:  Diagnosis Date  . Acid reflux   . Allergy   . Complete heart block ([place]) 09/2015   a. s/p St. Jude PPM 09/2015.  Marland Kitchen Dyspnea   . Former tobacco use   . Hyperlipidemia   . Migraines    Chronic  . PAT (paroxysmal atrial tachycardia) ([place])   . Presence of permanent cardiac pacemaker 09/19/2015  . Sinusitis   . Urticaria     Past Surgical History:  Procedure Laterality Date  . APPENDECTOMY    . AUGMENTATION MAMMAPLASTY Bilateral   . BREAST ENHANCEMENT SURGERY  1985  . CHOLECYSTECTOMY    . COLONOSCOPY    . EP IMPLANTABLE DEVICE N/A 09/19/2015   Procedure: Pacemaker Implant;  Surgeon: Will Meredith Leeds, MD;  Location: [facility] [place] [facility];  Service: Cardiovascular;  Laterality: N/A;  . TONSILLECTOMY AND ADENOIDECTOMY    . TUBAL LIGATION      Allergies: Beta adrenergic blockers; Ambien [zolpidem tartrate]; and Biaxin [clarithromycin]  Medications: Prior to Admission medications   Medication Sig Start Date End Date Taking? Authorizing Provider  alendronate (FOSAMAX) 70 MG tablet TAKE 1 TAB WITH A FULL GLASS OF WATER ON EMPTY STOMACH AND SIT UPRIGHT FOR 30 MINS EVERY 7 DAYS 04/17/16  Yes Kathyrn Drown, MD  Biotin 5000 MCG CAPS Take 5,000 mcg by mouth daily.   Yes Historical Provider, MD  Carboxymethylcellulose Sodium (THERATEARS OP) Place 5 drops into both eyes daily.   Yes Historical Provider, MD    clonazePAM (KLONOPIN) 0.5 MG tablet TAKE 1 TABLET BY MOUTH TWICE A DAY AS NEEDED FOR ANXIETY 07/15/16  Yes Kathyrn Drown, MD  cromolyn (NASALCROM) 5.2 MG/ACT nasal spray Place 1 spray into both nostrils 4 (four) times daily. 08/19/16  Yes Annia Belt, MD  levocetirizine (XYZAL) 5 MG tablet Take 1 tablet (5 mg total) by mouth every evening. 04/16/16  Yes Adelina Mings, MD  mometasone (NASONEX) 50 MCG/ACT nasal spray Place 2 sprays into the nose daily. Two sprays each in each nostril 04/16/16  Yes Adelina Mings, MD  montelukast (SINGULAIR) 10 MG tablet Take 1 tablet (10 mg total) by mouth at bedtime. 04/16/16  Yes Adelina Mings, MD  Multiple Vitamin (MULTIVITAMIN WITH MINERALS) TABS tablet Take 1 tablet by mouth daily.   Yes Historical Provider, MD  Olopatadine HCl (PATADAY) 0.2 % SOLN Place 1 drop into both eyes 1 day or 1 dose. 04/16/16  Yes Adelina Mings, MD  PSYLLIUM PO Take 6 capsules by mouth at bedtime.   Yes Historical Provider, MD  ranitidine (ZANTAC) 150 MG tablet One tablet one to two times a day as directed 05/01/16  Yes Adelina Mings, MD  rizatriptan (MAXALT-MLT) 10 MG disintegrating tablet TAKE ONE TABLET BY MOUTH AT ONSET OF MIGRAINE MAY REPEAT IN 2 HOURS IF NEEDED MAX 2 PER 24 HOURS 04/22/16  Yes Mikey Kirschner, MD  topiramate (TOPAMAX) 100  MG tablet TAKE 1 TABLET (100 MG TOTAL) BY MOUTH 2 (TWO) TIMES DAILY. 04/01/16  Yes Kathyrn Drown, MD  vitamin C (ASCORBIC ACID) 500 MG tablet Take 500 mg by mouth daily.    Yes Historical Provider, MD     Family History  Problem Relation Age of Onset  . Congestive Heart Failure Mother   . Diabetes Mother   . Dementia Mother   . Parkinson's disease Mother   . Lung cancer Father   . Diabetes Sister   . Diabetes Maternal Grandmother   . Allergic rhinitis Maternal Grandmother   . Asthma Maternal Grandmother   . Cancer Maternal Grandfather   . Diabetes Paternal Grandmother   . Heart attack Paternal  Grandmother   . Heart disease Paternal Grandmother   . Asthma Paternal Grandmother   . Angioedema Neg Hx   . Eczema Neg Hx   . Urticaria Neg Hx     Social History   Social History  . Marital status: Married    Spouse name: N/A  . Number of children: N/A  . Years of education: N/A   Social History Main Topics  . Smoking status: Former Smoker    Types: Cigarettes    Quit date: 06/03/2004  . Smokeless tobacco: Never Used     Comment: quit x 11 yrs.  . Alcohol use No  . Drug use: No  . Sexual activity: Yes    Partners: Male    Birth control/ protection: Surgical   Other Topics Concern  . None   Social History Narrative  . None    Review of Systems  Constitutional: Negative for fatigue and fever.  Respiratory: Negative for cough and shortness of breath.   Cardiovascular: Negative for chest pain.  Gastrointestinal: Negative for abdominal pain.  Psychiatric/Behavioral: Negative for behavioral problems and confusion.    Vital Signs: BP (!) 106/51 (BP Location: Right Arm)   Pulse 68   Temp 97.9 F (36.6 C) (Oral)   Resp 16   SpO2 100%   Physical Exam  Constitutional: She is oriented to person, place, and time. She appears well-developed.  Cardiovascular: Normal rate, regular rhythm and normal heart sounds.   Pulmonary/Chest: Effort normal and breath sounds normal. No respiratory distress.  Neurological: She is alert and oriented to person, place, and time.  Skin: Skin is warm and dry.  Psychiatric: She has a normal mood and affect. Her behavior is normal. Judgment and thought content normal.  Nursing note and vitals reviewed.   Mallampati Score:  MD Evaluation Airway: WNL Heart: WNL Abdomen: WNL Chest/ Lungs: WNL ASA  Classification: 3 Mallampati/Airway Score: Two  Imaging: Mm Digital Screening W/ Implants Bilateral  Result Date: 09/02/2016 CLINICAL DATA:  Screening. EXAM: DIGITAL SCREENING BILATERAL MAMMOGRAM WITH IMPLANTS AND CAD The patient has  retropectoral implants. Standard and implant displaced views were performed. COMPARISON:  Previous exam(s). ACR Breast Density Category b: There are scattered areas of fibroglandular density. FINDINGS: There are no findings suspicious for malignancy. Images were processed with CAD. IMPRESSION: No mammographic evidence of malignancy. A result letter of this screening mammogram will be mailed directly to the patient. RECOMMENDATION: Screening mammogram in one year. (Code:SM-B-01Y) BI-RADS CATEGORY  1:  Negative. Electronically Signed   By: Lajean Manes M.D.   On: 09/02/2016 12:56    Labs:  CBC:  Recent Labs  09/23/15 1534 01/12/16 0956 04/18/16 1615 08/19/16 1520 09/04/16 0902  WBC 7.4 5.8 5.9 7.5 7.1  HGB 12.6 14.5 13.7  --  15.1*  HCT 39.5 44.5 43.0 43.7 45.0  PLT 171 217 250 249 247    COAGS:  Recent Labs  09/04/16 0902  INR 0.86  APTT 26    BMP:  Recent Labs  09/18/15 1805 09/18/15 1914 09/23/15 1534 01/12/16 0956 04/18/16 1615  NA 143  --  140 142 139  K 5.2*  --  3.8 4.0 4.0  CL 110  --  110 108 107  CO2 24  --  20* 25 25  GLUCOSE 84  --  92 92 78  BUN 14  --  _0 CALCIUM 8.7*  --  8.4* 9.3 8.8  CREATININE 0.95 1.01* 0.71 0.87 0.92  GFRNONAA >60 58* >60 >60  --   GFRAA >60 >60 >60 >60  --     LIVER FUNCTION TESTS:  Recent Labs  04/18/16 1615 08/19/16 1520  BILITOT 0.3  --   AST 17  --   ALT 10  --   ALKPHOS 54  --   PROT 6.1 6.4  ALBUMIN 3.9  --     TUMOR MARKERS: No results for input(s): AFPTM, CEA, CA199, CHROMGRNA in the last 8760 hours.  Assessment and Plan: [name] is a 66 y.o. female acid reflux, dyspnea, HLD, and paroxysmal atrial tachycardia who presents with complaint of urticaria and elevated tryptase. Bone marrow biopsy requested to rule out mastocytosis at the request of Dr. Beryle Beams.  Patient presents for procedure today.  She has been NPO.  She does not take blood thinners and has been in her usual state of  health. Risks and benefits discussed with the patient including, but not limited to bleeding, infection, damage to adjacent structures or low yield requiring additional tests. All of the patient's questions were answered, patient is agreeable to proceed. Consent signed and in chart.  Thank you for this interesting consult.  I greatly enjoyed meeting Tyaira Heward Hummel and look forward to participating in their care.  A copy of this report was sent to the requesting provider on this date.  Electronically Signed: Docia Barrier 09/04/2016, 9:53 AM   I spent a total of  30 Minutes   in face to face in clinical consultation, greater than 50% of which was counseling/coordinating care for urticaria, elevated tryptase

## 2016-09-04 NOTE — Procedures (Signed)
Interventional Radiology Procedure Note  Procedure: CT guided aspirate and core biopsy of posterior right iliac bone Complications: None Recommendations: - Bedrest supine x 1 hrs - OTC's PRN  Pain - Follow biopsy results  Signed,  Quinteria Chisum S. Abria Vannostrand, DO   

## 2016-09-04 NOTE — Discharge Instructions (Addendum)
Moderate Conscious Sedation, Adult, Care After These instructions provide you with information about caring for yourself after your procedure. Your health care provider may also give you more specific instructions. Your treatment has been planned according to current medical practices, but problems sometimes occur. Call your health care provider if you have any problems or questions after your procedure. What can I expect after the procedure? After your procedure, it is common:  To feel sleepy for several hours.  To feel clumsy and have poor balance for several hours.  To have poor judgment for several hours.  To vomit if you eat too soon. Follow these instructions at home: For at least 24 hours after the procedure:    Do not:  Participate in activities where you could fall or become injured.  Drive.  Use heavy machinery.  Drink alcohol.  Take sleeping pills or medicines that cause drowsiness.  Make important decisions or sign legal documents.  Take care of children on your own.  Rest. Eating and drinking   Follow the diet recommended by your health care provider.  If you vomit:  Drink water, juice, or soup when you can drink without vomiting.  Make sure you have little or no nausea before eating solid foods. General instructions   Have a responsible adult stay with you until you are awake and alert.  Take over-the-counter and prescription medicines only as told by your health care provider.  If you smoke, do not smoke without supervision.  Keep all follow-up visits as told by your health care provider. This is important. Contact a health care provider if:  You keep feeling nauseous or you keep vomiting.  You feel light-headed.  You develop a rash.  You have a fever. Get help right away if:  You have trouble breathing. This information is not intended to replace advice given to you by your health care provider. Make sure you discuss any questions you  have with your health care provider. Document Released: 03/10/2013 Document Revised: 10/23/2015 Document Reviewed: 09/09/2015 Elsevier Interactive Patient Education  2017 [place].    Bone Marrow Aspiration and Bone Marrow Biopsy, Adult, Care After This sheet gives you information about how to care for yourself after your procedure. Your health care provider may also give you more specific instructions. If you have problems or questions, contact your health care provider. What can I expect after the procedure? After the procedure, it is common to have:  Mild pain and tenderness.  Swelling.  Bruising. Follow these instructions at home:  Take over-the-counter or prescription medicines only as told by your health care provider.  Do not take baths, swim, or use a hot tub until your health care provider approves. Ask if you can take a shower or have a sponge bath.  Follow instructions from your health care provider about how to take care of the puncture site. Make sure you:  Wash your hands with soap and water before you change your bandage (dressing). If soap and water are not available, use hand sanitizer.  Change your dressing as told by your health care provider.  Check your puncture siteevery day for signs of infection. Check for:  More redness, swelling, or pain.  More fluid or blood.  Warmth.  Pus or a bad smell.  Return to your normal activities as told by your health care provider. Ask your health care provider what activities are safe for you.  Do not drive for 24 hours if you were given a medicine to  help you relax (sedative).  Keep all follow-up visits as told by your health care provider. This is important. Contact a health care provider if:  You have more redness, swelling, or pain around the puncture site.  You have more fluid or blood coming from the puncture site.  Your puncture site feels warm to the touch.  You have pus or a bad smell coming from  the puncture site.  You have a fever.  Your pain is not controlled with medicine. This information is not intended to replace advice given to you by your health care provider. Make sure you discuss any questions you have with your health care provider. Document Released: 12/07/2004 Document Revised: 12/08/2015 Document Reviewed: 11/01/2015 Elsevier Interactive Patient Education  2017 [employer].

## 2016-09-04 NOTE — Sedation Documentation (Signed)
Patient is resting comfortably. 

## 2016-09-12 ENCOUNTER — Encounter (HOSPITAL_COMMUNITY): Payer: Self-pay

## 2016-09-12 LAB — CHROMOSOME ANALYSIS, BONE MARROW

## 2016-09-16 ENCOUNTER — Ambulatory Visit (INDEPENDENT_AMBULATORY_CARE_PROVIDER_SITE_OTHER): Payer: 59 | Admitting: Cardiology

## 2016-09-16 ENCOUNTER — Encounter: Payer: Self-pay | Admitting: Cardiology

## 2016-09-16 VITALS — BP 110/80 | HR 71 | Ht 64.5 in | Wt 169.8 lb

## 2016-09-16 DIAGNOSIS — I442 Atrioventricular block, complete: Secondary | ICD-10-CM

## 2016-09-16 DIAGNOSIS — Z45018 Encounter for adjustment and management of other part of cardiac pacemaker: Secondary | ICD-10-CM

## 2016-09-16 NOTE — Patient Instructions (Addendum)
Medication Instructions:    Your physician recommends that you continue on your current medications as directed. Please refer to the Current Medication list given to you today.  --- If you need a refill on your cardiac medications before your next appointment, please call your pharmacy. ---  Labwork:  None ordered  Testing/Procedures:  None ordered  Follow-Up: Remote monitoring is used to monitor your Pacemaker of ICD from home. This monitoring reduces the number of office visits required to check your device to one time per year. It allows [place] to keep an eye on the functioning of your device to ensure it is working properly. You are scheduled for a device check from home on 12/19/2016. You may send your transmission at any time that day. If you have a wireless device, the transmission will be sent automatically. After your physician reviews your transmission, you will receive a postcard with your next transmission date.   Your physician wants you to follow-up in: 1 year with Dr. Curt Bears.  You will receive a reminder letter in the mail two months in advance. If you don't receive a letter, please call our office to schedule the follow-up appointment.   Thank you for choosing [facility]!!   Trinidad Curet, RN [phone]

## 2016-09-16 NOTE — Progress Notes (Signed)
Electrophysiology Office Note   Date:  09/16/2016   ID:  [name], DOB 1951-03-30, MRN [id number]  PCP:  [name], MD  Primary Electrophysiologist:  Yasmin Bronaugh Meredith Leeds, MD    Chief Complaint  Patient presents with  . Pacemaker Check    Complete heart block     History of Present Illness: [name] is a 66 y.o. female who presents today for electrophysiology evaluation.   Presented to the hospital after being found in complete heart block.  St. Jude dual chamber pacemaker placed 09/19/15. Presented to the hospital with fatigue and had her pacemaker settings changed.  She presents today feeling fatigued, weak. Over the past few months she has had facial and neck swelling and is undergoing allergy workup. She says that she is not having any problems currently with her heart.    Today, she denies symptoms of chest pain, orthopnea, PND, lower extremity edema, claudication, presyncope, syncope, bleeding, or neurologic sequela. The patient is tolerating medications without difficulties and is otherwise without complaint today.    Past Medical History:  Diagnosis Date  . Acid reflux   . Allergy   . Complete heart block ([place]) 09/2015   a. s/p St. Jude PPM 09/2015.  Marland Kitchen Dyspnea   . Former tobacco use   . Hyperlipidemia   . Migraines    Chronic  . PAT (paroxysmal atrial tachycardia) ([place])   . Presence of permanent cardiac pacemaker 09/19/2015  . Sinusitis   . Urticaria    Past Surgical History:  Procedure Laterality Date  . APPENDECTOMY    . AUGMENTATION MAMMAPLASTY Bilateral   . BREAST ENHANCEMENT SURGERY  1985  . CHOLECYSTECTOMY    . COLONOSCOPY    . EP IMPLANTABLE DEVICE N/A 09/19/2015   Procedure: Pacemaker Implant;  Surgeon: Tymon Nemetz Meredith Leeds, MD;  Location: [facility];  Service: Cardiovascular;  Laterality: N/A;  . TONSILLECTOMY AND ADENOIDECTOMY    . TUBAL LIGATION       Current Outpatient Prescriptions  Medication Sig Dispense Refill    . alendronate (FOSAMAX) 70 MG tablet TAKE 1 TAB WITH A FULL GLASS OF WATER ON EMPTY STOMACH AND SIT UPRIGHT FOR 30 MINS EVERY 7 DAYS 12 tablet 1  . Biotin 5000 MCG CAPS Take 5,000 mcg by mouth daily.    . Carboxymethylcellulose Sodium (THERATEARS OP) Place 5 drops into both eyes daily.    . clonazePAM (KLONOPIN) 0.5 MG tablet TAKE 1 TABLET BY MOUTH TWICE A DAY AS NEEDED FOR ANXIETY 40 tablet 2  . mometasone (NASONEX) 50 MCG/ACT nasal spray Place 2 sprays into the nose daily. Two sprays each in each nostril 17 g 5  . montelukast (SINGULAIR) 10 MG tablet Take 1 tablet (10 mg total) by mouth at bedtime. 30 tablet 5  . Multiple Vitamin (MULTIVITAMIN WITH MINERALS) TABS tablet Take 1 tablet by mouth daily.    . PSYLLIUM PO Take 6 capsules by mouth at bedtime.    . ranitidine (ZANTAC) 150 MG tablet One tablet one to two times a day as directed 60 tablet 5  . rizatriptan (MAXALT-MLT) 10 MG disintegrating tablet TAKE ONE TABLET BY MOUTH AT ONSET OF MIGRAINE MAY REPEAT IN 2 HOURS IF NEEDED MAX 2 PER 24 HOURS 18 tablet 5  . topiramate (TOPAMAX) 100 MG tablet TAKE 1 TABLET (100 MG TOTAL) BY MOUTH 2 (TWO) TIMES DAILY. 180 tablet 1  . vitamin C (ASCORBIC ACID) 500 MG tablet Take 500 mg by mouth daily.  No current facility-administered medications for this visit.     Allergies:   Beta adrenergic blockers; Ambien [zolpidem tartrate]; and Biaxin [clarithromycin]   Social History:  The patient  reports that she quit smoking about 12 years ago. Her smoking use included Cigarettes. She has never used smokeless tobacco. She reports that she does not drink alcohol or use drugs.   Family History:  The patient's family history includes Allergic rhinitis in her maternal grandmother; Asthma in her maternal grandmother and paternal grandmother; Cancer in her maternal grandfather; Congestive Heart Failure in her mother; Dementia in her mother; Diabetes in her maternal grandmother, mother, paternal grandmother, and  sister; Heart attack in her paternal grandmother; Heart disease in her paternal grandmother; Lung cancer in her father; Parkinson's disease in her mother.    ROS:  Please see the history of present illness.   Otherwise, review of systems is positive for weight gain, fatigue, facial swelling, shortness of breath with activity, constipation, depression, muscle pain, balance problems, easy bruising.   All other systems are reviewed and negative.    PHYSICAL EXAM: VS:  BP 110/80   Pulse 71   Ht 5' 4.5" (1.638 m)   Wt 169 lb 12.8 oz (77 kg)   BMI 28.70 kg/m  , BMI Body mass index is 28.7 kg/m. GEN: Well nourished, well developed, in no acute distress  HEENT: normal  Neck: no JVD, carotid bruits, or masses Cardiac: RRR; no murmurs, rubs, or gallops,no edema  Respiratory:  clear to auscultation bilaterally, normal work of breathing GI: soft, nontender, nondistended, + BS MS: no deformity or atrophy  Skin: warm and dry,  device pocket is well healed Neuro:  Strength and sensation are intact Psych: euthymic mood, full affect  EKG:  EKG is ordered today. Personal review of the ekg ordered shows A sense, V pace   Device interrogation is personally reviewed today in detail.  See PaceArt for details.   Recent Labs: 01/12/2016: B Natriuretic Peptide 25.8; Magnesium 2.3 04/18/2016: ALT 10; BUN 18; Creat 0.92; Potassium 4.0; Sodium 139; TSH 1.45 09/04/2016: Hemoglobin 15.1; Platelets 247    Lipid Panel     Component Value Date/Time   CHOL 214 (H) 01/19/2014 0918   TRIG 67 01/19/2014 0918   HDL 91 01/19/2014 0918   CHOLHDL 2.4 01/19/2014 0918   VLDL 13 01/19/2014 0918   LDLCALC 110 (H) 01/19/2014 0918     Wt Readings from Last 3 Encounters:  09/16/16 169 lb 12.8 oz (77 kg)  08/19/16 165 lb 14.4 oz (75.3 kg)  05/14/16 156 lb 3.2 oz (70.9 kg)      Other studies Reviewed: Additional studies/ records that were reviewed today include: TTE 01/23/16 - Left ventricle: The cavity size was  normal. Wall thickness was   normal. Systolic function was normal. The estimated ejection   fraction was in the range of 55% to 60%. Wall motion was normal;   there were no regional wall motion abnormalities. Doppler   parameters are consistent with abnormal left ventricular   relaxation (grade 1 diastolic dysfunction). - Aortic valve: Trileaflet; mildly calcified leaflets. - Right ventricle: Pacer wire or catheter noted in right ventricle. - Right atrium: Central venous pressure (est): 3 mm Hg. - Tricuspid valve: There was trivial regurgitation. - Pulmonary arteries: PA peak pressure: 20 mm Hg (S). - Pericardium, extracardiac: There was no pericardial effusion.  Holter 02/12/16 Sinus rhythm 1 degree AV block Minimum HR: 50 BPM at 12:59:11 AM Maximum HR: 118 BPM at 8:00:47  AM Average HR: 74 BPM.  ASSESSMENT AND PLAN:  1.  Complete heart block: dual chamber pacemaker placed 09/19/15.  Vice functioning appropriately without major issue. We'll continue current management. No changes made to the pacemaker today.  2. Hyperlipidemia: Per primary physician  Current medicines are reviewed at length with the patient today.   The patient does not have concerns regarding her medicines.  The following changes were made today:    Labs/ tests ordered today include:  No orders of the defined types were placed in this encounter.    Disposition:   FU with Karmel Patricelli 12 months  Signed, Siani Utke Meredith Leeds, MD  09/16/2016 11:08 AM     [facility] [address] [place] [address] [phone] (office) [phone] (fax)

## 2016-09-20 LAB — CUP PACEART INCLINIC DEVICE CHECK
Battery Voltage: 3.01 V
Date Time Interrogation Session: 20180416145920
Implantable Lead Location: 753860
Implantable Pulse Generator Implant Date: 20170418
Lead Channel Impedance Value: 462.5 Ohm
Lead Channel Pacing Threshold Amplitude: 0.75 V
Lead Channel Pacing Threshold Pulse Width: 0.5 ms
Lead Channel Sensing Intrinsic Amplitude: 5.6 mV
Lead Channel Setting Pacing Amplitude: 2.5 V
Lead Channel Setting Pacing Pulse Width: 0.5 ms
MDC IDC LEAD IMPLANT DT: 20170418
MDC IDC LEAD IMPLANT DT: 20170418
MDC IDC LEAD LOCATION: 753859
MDC IDC MSMT LEADCHNL RA IMPEDANCE VALUE: 525 Ohm
MDC IDC MSMT LEADCHNL RA SENSING INTR AMPL: 5 mV
MDC IDC MSMT LEADCHNL RV PACING THRESHOLD AMPLITUDE: 1.25 V
MDC IDC MSMT LEADCHNL RV PACING THRESHOLD PULSEWIDTH: 0.5 ms
MDC IDC SET LEADCHNL RA PACING AMPLITUDE: 2 V
MDC IDC SET LEADCHNL RV SENSING SENSITIVITY: 2 mV
MDC IDC STAT BRADY RA PERCENT PACED: 0.18 %
MDC IDC STAT BRADY RV PERCENT PACED: 82 %
Pulse Gen Model: 2272
Pulse Gen Serial Number: 7882213

## 2016-09-27 ENCOUNTER — Other Ambulatory Visit: Payer: Self-pay | Admitting: Family Medicine

## 2016-10-03 ENCOUNTER — Other Ambulatory Visit: Payer: Self-pay | Admitting: Allergy and Immunology

## 2016-10-10 ENCOUNTER — Encounter: Payer: Self-pay | Admitting: Family Medicine

## 2016-10-10 ENCOUNTER — Ambulatory Visit (INDEPENDENT_AMBULATORY_CARE_PROVIDER_SITE_OTHER): Payer: 59 | Admitting: Family Medicine

## 2016-10-10 VITALS — BP 118/70 | Ht 64.5 in | Wt 165.0 lb

## 2016-10-10 DIAGNOSIS — R5383 Other fatigue: Secondary | ICD-10-CM

## 2016-10-10 DIAGNOSIS — R1011 Right upper quadrant pain: Secondary | ICD-10-CM | POA: Diagnosis not present

## 2016-10-10 DIAGNOSIS — M2550 Pain in unspecified joint: Secondary | ICD-10-CM | POA: Diagnosis not present

## 2016-10-10 DIAGNOSIS — G629 Polyneuropathy, unspecified: Secondary | ICD-10-CM

## 2016-10-10 DIAGNOSIS — M791 Myalgia, unspecified site: Secondary | ICD-10-CM

## 2016-10-10 NOTE — Progress Notes (Signed)
   Subjective:    Patient ID: [name], female    DOB: 10/02/1950, 66 y.o.   MRN: [id number]  Constipation  This is a recurrent problem. Episode onset: 6 months. Associated symptoms include abdominal pain and bloating. Treatments tried: laxative, epsom salt, fiber tabs, miralax.  Patient relates a fair amount of myalgias and arthralgias and not feeling good. She also relates numbness in her feet in addition to this right upper quadrant abdominal pain and discomfort in the perception that she's not having proper bowel movements she is up-to-date on colonoscopy she denies bleeding denies change and appetite denies weight loss. States her appetite is good denies fever sweats chills. She has been evaluated by oncology for what is referred to as mast syndrome and it was negative. Her specialists felt she had an immune issue or possibly connective tissue disease recommended that she sees a rheumatologist Numbness and tingling in the bottom of feet for 3 months.  This is mainly in the bottom of her feet does not March upper leg no weakness with it. No cough wheezing difficulty breathing no sweats chills.  Review of Systems  Gastrointestinal: Positive for abdominal pain, bloating and constipation.       Objective:   Physical Exam Lungs clear no crackles heart regular pulse normal abdomen subjective right upper quadrant tenderness no mass felt extremities no edema skin warm dry neurologic grossly normal the patient relates right upper quadrant pain and discomfort present for the past 6 months which is been more progressive over the past couple months wakes her up in the middle the night she is RD had gallbladder out. She denies any regurgitation. She just relates significant pain and discomfort       Assessment & Plan:  Very complex patient  Right upper quadrant abdominal pain with constipation change in bowel habits patient is been having pain for approximately 6 months wakes her up at  night denies bleeding as RD had a cholecystectomy. I would recommend CT scan of the abdomen with contrast  Probable connective tissue autoimmune disease referral to rheumatology for further evaluation  Neuropathy in the feet not severe if progressive referral for nerve conduction study neurology consult  Follow-up 6 weeks

## 2016-10-13 LAB — BASIC METABOLIC PANEL
BUN / CREAT RATIO: 25 (ref 12–28)
BUN: 18 mg/dL (ref 8–27)
CO2: 23 mmol/L (ref 18–29)
Calcium: 8.9 mg/dL (ref 8.7–10.3)
Chloride: 104 mmol/L (ref 96–106)
Creatinine, Ser: 0.71 mg/dL (ref 0.57–1.00)
GFR calc Af Amer: 104 mL/min/{1.73_m2} (ref >59)
GFR calc non Af Amer: 90 mL/min/{1.73_m2} (ref >59)
Glucose: 82 mg/dL (ref 65–99)
Potassium: 4.5 mmol/L (ref 3.5–5.2)
Sodium: 142 mmol/L (ref 134–144)

## 2016-10-13 LAB — B. BURGDORFI ANTIBODIES: Lyme IgG/IgM Ab: <0.91 {ISR} (ref 0.00–0.90)

## 2016-10-13 LAB — SEDIMENTATION RATE: SED RATE: 2 mm/h (ref 0–40)

## 2016-10-13 LAB — TISSUE TRANSGLUTAMINASE, IGA

## 2016-10-13 LAB — CK: CK TOTAL: 150 U/L (ref 24–173)

## 2016-10-13 LAB — T4, FREE: FREE T4: 1.18 ng/dL (ref 0.82–1.77)

## 2016-10-13 LAB — VITAMIN B12: Vitamin B-12: 319 pg/mL (ref 232–1245)

## 2016-10-13 LAB — TSH: TSH: 1.08 u[IU]/mL (ref 0.450–4.500)

## 2016-10-15 ENCOUNTER — Encounter: Payer: Self-pay | Admitting: Family Medicine

## 2016-10-15 LAB — SPECIMEN STATUS REPORT

## 2016-10-15 NOTE — Addendum Note (Signed)
Addended by: Launa Grill on: 10/15/2016 09:51 AM   Modules accepted: Orders

## 2016-10-18 ENCOUNTER — Other Ambulatory Visit: Payer: Self-pay | Admitting: *Deleted

## 2016-10-18 DIAGNOSIS — R1011 Right upper quadrant pain: Secondary | ICD-10-CM

## 2016-10-18 LAB — IFOBT (OCCULT BLOOD): IFOBT: NEGATIVE

## 2016-10-18 MED ORDER — VITAMIN B-12 1000 MCG PO TABS: 1000.0000 ug | ORAL_TABLET | Freq: Every day | ORAL | 0 refills | Status: DC

## 2016-10-18 NOTE — Addendum Note (Signed)
Addended by: Dairl Ponder on: 10/18/2016 11:37 AM   Modules accepted: Orders

## 2016-10-21 ENCOUNTER — Other Ambulatory Visit: Payer: Self-pay | Admitting: Allergy

## 2016-10-21 LAB — SPECIMEN STATUS REPORT

## 2016-10-21 LAB — METHYLMALONIC ACID, SERUM: METHYLMALONIC ACID: 121 nmol/L (ref 0–378)

## 2016-10-21 MED ORDER — RANITIDINE HCL 150 MG PO TABS: ORAL_TABLET | ORAL | 5 refills | Status: DC

## 2016-10-22 ENCOUNTER — Ambulatory Visit (HOSPITAL_COMMUNITY): Payer: 59

## 2016-10-28 ENCOUNTER — Emergency Department (HOSPITAL_COMMUNITY)
Admission: EM | Admit: 2016-10-28 | Discharge: 2016-10-28 | Disposition: A | Discharge status: Home / Self Care | Payer: 59 | Attending: Emergency Medicine | Admitting: Emergency Medicine

## 2016-10-28 ENCOUNTER — Encounter (HOSPITAL_COMMUNITY): Payer: Self-pay | Admitting: Emergency Medicine

## 2016-10-28 ENCOUNTER — Emergency Department (HOSPITAL_COMMUNITY): Payer: 59

## 2016-10-28 DIAGNOSIS — Y999 Unspecified external cause status: Secondary | ICD-10-CM | POA: Insufficient documentation

## 2016-10-28 DIAGNOSIS — S92355A Nondisplaced fracture of fifth metatarsal bone, left foot, initial encounter for closed fracture: Secondary | ICD-10-CM | POA: Diagnosis not present

## 2016-10-28 DIAGNOSIS — Y929 Unspecified place or not applicable: Secondary | ICD-10-CM | POA: Diagnosis not present

## 2016-10-28 DIAGNOSIS — S99922A Unspecified injury of left foot, initial encounter: Secondary | ICD-10-CM | POA: Diagnosis present

## 2016-10-28 DIAGNOSIS — Z87891 Personal history of nicotine dependence: Secondary | ICD-10-CM | POA: Insufficient documentation

## 2016-10-28 DIAGNOSIS — Y939 Activity, unspecified: Secondary | ICD-10-CM | POA: Insufficient documentation

## 2016-10-28 DIAGNOSIS — W19XXXA Unspecified fall, initial encounter: Secondary | ICD-10-CM | POA: Diagnosis not present

## 2016-10-28 NOTE — ED Triage Notes (Signed)
Patient states she fell yesterday and is complaining of left foot pain since fall.

## 2016-10-28 NOTE — ED Provider Notes (Signed)
[facility] DEPT Provider Note   CSN: [id number] Arrival date & time: 10/28/16  1012  By signing my name below, I, Collene Leyden, attest that this documentation has been prepared under the direction and in the presence of Helen Hashimoto, PA-C . Electronically Signed: Collene Leyden, Scribe. 10/28/16. 11:35 AM.  History   Chief Complaint Chief Complaint  Patient presents with  . Foot Injury   HPI Comments: [name] is a 66 y.o. female with a history of PAT and a complete heart block s/p pacemaker, who presents to the Emergency Department complaining of sudden-onset, constant left foot pain s/p fall yesterday. Patient states she was turning to talk to someone, when she fell and twisted her left foot. Patient reports associated swelling. No medications taken prior to arrival. Movement/ambulting makes her pain worse, nothing improves her pain. Patient is ambulatory in the emergency department. Patient denies any numbness, weakness, fever, chills, nausea, or vomiting.   The history is provided by the patient. No language interpreter was used.    Past Medical History:  Diagnosis Date  . Acid reflux   . Allergy   . Complete heart block ([place]) 09/2015   a. s/p [employer]. Jude PPM 09/2015.  Marland Kitchen Dyspnea   . Former tobacco use   . Hyperlipidemia   . Migraines    Chronic  . PAT (paroxysmal atrial tachycardia) ([place])   . Presence of permanent cardiac pacemaker 09/19/2015  . Sinusitis   . Urticaria     Patient Active Problem List   Diagnosis Date Noted  . Elevated serum tryptase 05/14/2016  . Allergic reactions 04/16/2016  . Angioedema 04/16/2016  . Perennial allergic rhinitis 04/16/2016  . Allergic conjunctivitis 04/16/2016  . Fatigue 01/12/2016  . Dyspnea 01/12/2016  . Chest tightness 01/12/2016  . PAT (paroxysmal atrial tachycardia) ([place]) 01/12/2016  . S/P placement of cardiac pacemaker 01/12/2016  . Complete heart block ([place]) 09/18/2015  . Constipation 07/14/2015  .  Urticaria/elevated serum tryptase 07/14/2015  . Insomnia 04/10/2015  . Depression with anxiety 04/10/2015  . Osteoporosis 01/27/2014  . Pain in joint, lower leg 08/30/2013  . Migraines 01/21/2013    Past Surgical History:  Procedure Laterality Date  . APPENDECTOMY    . AUGMENTATION MAMMAPLASTY Bilateral   . BREAST ENHANCEMENT SURGERY  1985  . CHOLECYSTECTOMY    . COLONOSCOPY    . EP IMPLANTABLE DEVICE N/A 09/19/2015   Procedure: Pacemaker Implant;  Surgeon: Will Meredith Leeds, MD;  Location: [facility];  Service: Cardiovascular;  Laterality: N/A;  . TONSILLECTOMY AND ADENOIDECTOMY    . TUBAL LIGATION      OB History    No data available       Home Medications    Prior to Admission medications   Medication Sig Start Date End Date Taking? Authorizing Provider  alendronate (FOSAMAX) 70 MG tablet TAKE 1 TAB EVERY 7 DAYS WITH FULL GLASS OF WATER ON EMPTY STOMACH AND SIT UPRIGHT FOR 30 MIN 09/27/16   Kathyrn Drown, MD  Biotin 5000 MCG CAPS Take 5,000 mcg by mouth daily.    [provider]  Carboxymethylcellulose Sodium (THERATEARS OP) Place 5 drops into both eyes daily.    [provider]  clonazePAM (KLONOPIN) 0.5 MG tablet TAKE 1 TABLET BY MOUTH TWICE A DAY AS NEEDED FOR ANXIETY 07/15/16   Kathyrn Drown, MD  levocetirizine (XYZAL) 5 MG tablet TAKE 1 TABLET BY MOUTH EVERY EVENING 10/03/16   Bobbitt, Sedalia Muta, MD  montelukast (SINGULAIR) 10 MG  tablet Take 1 tablet (10 mg total) by mouth at bedtime. 04/16/16   Bobbitt, Sedalia Muta, MD  Multiple Vitamin (MULTIVITAMIN WITH MINERALS) TABS tablet Take 1 tablet by mouth daily.    [provider]  NASALCROM 5.2 MG/ACT nasal spray PLACE 1 SPRAY INTO BOTH NOSTRILS 4 (FOUR) TIMES DAILY. 09/16/16   [provider]  PSYLLIUM PO Take 6 capsules by mouth at bedtime.    [provider]  ranitidine (ZANTAC) 150 MG tablet One tablet one to two times a day as directed 10/21/16   Bobbitt, Sedalia Muta, MD  rizatriptan (MAXALT-MLT) 10 MG disintegrating tablet TAKE ONE TABLET BY MOUTH AT ONSET OF MIGRAINE MAY REPEAT IN 2 HOURS IF NEEDED MAX 2 PER 24 HOURS 04/22/16   Mikey Kirschner, MD  topiramate (TOPAMAX) 100 MG tablet TAKE 1 TABLET (100 MG TOTAL) BY MOUTH 2 (TWO) TIMES DAILY. 04/01/16   Kathyrn Drown, MD  vitamin B-12 (CYANOCOBALAMIN) 1000 MCG tablet Take 1 tablet (1,000 mcg total) by mouth daily. 10/18/16   Kathyrn Drown, MD  vitamin C (ASCORBIC ACID) 500 MG tablet Take 500 mg by mouth daily.     [provider]    Family History Family History  Problem Relation Age of Onset  . Congestive Heart Failure Mother   . Diabetes Mother   . Dementia Mother   . Parkinson's disease Mother   . Lung cancer Father   . Diabetes Sister   . Diabetes Maternal Grandmother   . Allergic rhinitis Maternal Grandmother   . Asthma Maternal Grandmother   . Cancer Maternal Grandfather   . Diabetes Paternal Grandmother   . Heart attack Paternal Grandmother   . Heart disease Paternal Grandmother   . Asthma Paternal Grandmother   . Angioedema Neg Hx   . Eczema Neg Hx   . Urticaria Neg Hx     Social History Social History  Substance Use Topics  . Smoking status: Former Smoker    Types: Cigarettes    Quit date: 06/03/2004  . Smokeless tobacco: Never Used     Comment: quit x 11 yrs.  . Alcohol use No     Allergies   Beta adrenergic blockers; Ambien [zolpidem tartrate]; and Biaxin [clarithromycin]   Review of Systems Review of Systems  Constitutional: Negative for chills and fever.  Gastrointestinal: Negative for nausea and vomiting.  Musculoskeletal: Positive for arthralgias (left foot) and joint swelling (left foot). Negative for gait problem.  Neurological: Negative for weakness and numbness.  All other systems reviewed and are negative.    Physical Exam Updated Vital Signs BP (!) 102/58 (BP Location: Right Arm)   Pulse 66   Temp 98.4 F (36.9 C) (Oral)   Resp  18   Ht 5\' 4"  (1.626 m)   Wt 165 lb (74.8 kg)   SpO2 100%   BMI 28.32 kg/m   Physical Exam  Constitutional: She is oriented to person, place, and time. She appears well-developed and well-nourished.  HENT:  Head: Normocephalic and atraumatic.  Eyes: EOM are normal. Pupils are equal, round, and reactive to light.  Neck: Normal range of motion. Neck supple.  Cardiovascular: Normal rate and regular rhythm.   Pulmonary/Chest: Effort normal and breath sounds normal.  Musculoskeletal: Normal range of motion. She exhibits edema and tenderness. She exhibits no deformity.  Ecchymosis and edema of the left foot. Tenderness to the 5th metatarsal. Neurosensory and neurovascularly intact.   Neurological: She is alert and oriented to person, place, and time.  Skin: Skin is warm and dry.  Psychiatric: She has a normal mood and affect.  Nursing note and vitals reviewed.    ED Treatments / Results  DIAGNOSTIC STUDIES: Oxygen Saturation is 100% on RA, normal by my interpretation.    COORDINATION OF CARE: 11:31 AM Discussed treatment plan with pt at bedside and pt agreed to plan, which includes a cam walker, pain medication, crutches, ice, elevate, and follow-up with Dr. Guy Franco.     Labs (all labs ordered are listed, but only abnormal results are displayed) Labs Reviewed - No data to display  EKG  EKG Interpretation None       Radiology No results found.  Procedures Procedures (including critical care time)  Medications Ordered in ED Medications - No data to display   Initial Impression / Assessment and Plan / ED Course  I have reviewed the triage vital signs and the nursing notes.  Pertinent labs & imaging results that were available during my care of the patient were reviewed by me and considered in my medical decision making (see chart for details).      Plan will be to apply a cam walker, crutches, ice, elevate, and follow-up with Dr. Guy Franco.       Final Clinical  Impressions(s) / ED Diagnoses   Final diagnoses:  Closed nondisplaced fracture of fifth metatarsal bone of left foot, initial encounter    New Prescriptions Discharge Medication List as of 10/28/2016 11:34 AM     Allergies as of 10/28/2016      Reactions   Beta Adrenergic Blockers Shortness Of Breath   Ambien [zolpidem Tartrate] Other (See Comments)   Hallucinations   Biaxin [clarithromycin] Hives   May have caused hives see 06/19/15      Medication List    ASK your doctor about these medications   alendronate 70 MG tablet Commonly known as:  FOSAMAX TAKE 1 TAB EVERY 7 DAYS WITH FULL GLASS OF WATER ON EMPTY STOMACH AND SIT UPRIGHT FOR 30 MIN   Biotin 5000 MCG Caps Take 5,000 mcg by mouth daily.   clonazePAM 0.5 MG tablet Commonly known as:  KLONOPIN TAKE 1 TABLET BY MOUTH TWICE A DAY AS NEEDED FOR ANXIETY   levocetirizine 5 MG tablet Commonly known as:  XYZAL TAKE 1 TABLET BY MOUTH EVERY EVENING   montelukast 10 MG tablet Commonly known as:  SINGULAIR Take 1 tablet (10 mg total) by mouth at bedtime.   multivitamin with minerals Tabs tablet Take 1 tablet by mouth daily.   NASALCROM 5.2 MG/ACT nasal spray Generic drug:  cromolyn PLACE 1 SPRAY INTO BOTH NOSTRILS 4 (FOUR) TIMES DAILY.   PSYLLIUM PO Take 6 capsules by mouth at bedtime.   ranitidine 150 MG tablet Commonly known as:  ZANTAC One tablet one to two times a day as directed   rizatriptan 10 MG disintegrating tablet Commonly known as:  MAXALT-MLT TAKE ONE TABLET BY MOUTH AT ONSET OF MIGRAINE MAY REPEAT IN 2 HOURS IF NEEDED MAX 2 PER 24 HOURS   THERATEARS OP Place 5 drops into both eyes daily.   topiramate 100 MG tablet Commonly known as:  TOPAMAX TAKE 1 TABLET (100 MG TOTAL) BY MOUTH 2 (TWO) TIMES DAILY.   vitamin B-12 1000 MCG tablet Commonly known as:  CYANOCOBALAMIN Take 1 tablet (1,000 mcg total) by mouth daily.   vitamin C 500 MG tablet Commonly known as:  ASCORBIC ACID Take 500 mg by  mouth daily.      I personally performed the  services in this documentation, which was scribed in my presence.  The recorded information has been reviewed and considered.   Ronnald Collum.   Fransico Meadow, [place] 10/28/16 1552    Elnora Morrison, MD 10/29/16 [phone]

## 2016-10-28 NOTE — ED Notes (Signed)
Pt declined crutches.  Able to walk in cam walker

## 2016-10-28 NOTE — Discharge Instructions (Signed)
Schedule to see Dr. Mardelle Matte for evaluation in 1 week   Ice to area of swelling.  Elevate

## 2016-10-28 NOTE — ED Notes (Signed)
No crutches

## 2016-11-01 ENCOUNTER — Ambulatory Visit (HOSPITAL_COMMUNITY)
Admission: RE | Admit: 2016-11-01 | Discharge: 2016-11-01 | Disposition: A | Discharge status: Home / Self Care | Payer: 59 | Source: Ambulatory Visit | Attending: Family Medicine | Admitting: Family Medicine

## 2016-11-01 DIAGNOSIS — R140 Abdominal distension (gaseous): Secondary | ICD-10-CM | POA: Insufficient documentation

## 2016-11-01 DIAGNOSIS — R1011 Right upper quadrant pain: Secondary | ICD-10-CM | POA: Insufficient documentation

## 2016-11-01 MED ORDER — IOPAMIDOL (ISOVUE-300) INJECTION 61%
100.0000 mL | Freq: Once | INTRAVENOUS | Status: AC | PRN
  Administered 2016-11-01: 100 mL via INTRAVENOUS

## 2016-11-15 ENCOUNTER — Other Ambulatory Visit: Payer: Self-pay | Admitting: Allergy and Immunology

## 2016-11-16 ENCOUNTER — Other Ambulatory Visit: Payer: Self-pay | Admitting: Family Medicine

## 2016-11-21 ENCOUNTER — Ambulatory Visit: Payer: 59 | Admitting: Family Medicine

## 2016-11-22 ENCOUNTER — Other Ambulatory Visit: Payer: Self-pay

## 2016-11-22 MED ORDER — LEVOCETIRIZINE DIHYDROCHLORIDE 5 MG PO TABS: 5.0000 mg | ORAL_TABLET | Freq: Every evening | ORAL | 0 refills | Status: DC

## 2016-12-16 ENCOUNTER — Ambulatory Visit (INDEPENDENT_AMBULATORY_CARE_PROVIDER_SITE_OTHER): Payer: 59 | Admitting: *Deleted

## 2016-12-16 DIAGNOSIS — I442 Atrioventricular block, complete: Secondary | ICD-10-CM

## 2016-12-16 NOTE — Progress Notes (Signed)
Remote pacemaker transmission.   

## 2016-12-17 ENCOUNTER — Other Ambulatory Visit: Payer: Self-pay | Admitting: Family Medicine

## 2016-12-18 ENCOUNTER — Encounter: Payer: Self-pay | Admitting: Cardiology

## 2016-12-18 LAB — CUP PACEART REMOTE DEVICE CHECK
Battery Remaining Longevity: 114 mo
Battery Remaining Percentage: 95.5 %
Battery Voltage: 2.99 V
Brady Statistic AS VP Percent: 99 %
Brady Statistic RA Percent Paced: 1 %
Date Time Interrogation Session: 20180716060015
Implantable Lead Implant Date: 20170418
Implantable Lead Location: 753860
Implantable Pulse Generator Implant Date: 20170418
Lead Channel Impedance Value: 510 Ohm
Lead Channel Pacing Threshold Amplitude: 0.75 V
Lead Channel Pacing Threshold Pulse Width: 0.5 ms
Lead Channel Sensing Intrinsic Amplitude: 5 mV
Lead Channel Sensing Intrinsic Amplitude: 7.9 mV
Lead Channel Setting Pacing Amplitude: 2 V
Lead Channel Setting Sensing Sensitivity: 2 mV
MDC IDC LEAD IMPLANT DT: 20170418
MDC IDC LEAD LOCATION: 753859
MDC IDC MSMT LEADCHNL RV IMPEDANCE VALUE: 480 Ohm
MDC IDC MSMT LEADCHNL RV PACING THRESHOLD AMPLITUDE: 1.25 V
MDC IDC MSMT LEADCHNL RV PACING THRESHOLD PULSEWIDTH: 0.5 ms
MDC IDC SET LEADCHNL RV PACING AMPLITUDE: 2.5 V
MDC IDC SET LEADCHNL RV PACING PULSEWIDTH: 0.5 ms
MDC IDC STAT BRADY AP VP PERCENT: 1 %
MDC IDC STAT BRADY AP VS PERCENT: 1 %
MDC IDC STAT BRADY AS VS PERCENT: 1 %
MDC IDC STAT BRADY RV PERCENT PACED: 99 %
Pulse Gen Model: 2272
Pulse Gen Serial Number: 7882213

## 2017-01-04 ENCOUNTER — Other Ambulatory Visit: Payer: Self-pay | Admitting: Family Medicine

## 2017-01-06 NOTE — Telephone Encounter (Signed)
Last seen 10/10/16

## 2017-01-25 ENCOUNTER — Other Ambulatory Visit: Payer: Self-pay | Admitting: Family Medicine

## 2017-01-27 NOTE — Telephone Encounter (Signed)
May had this +2 refills

## 2017-02-19 ENCOUNTER — Other Ambulatory Visit: Payer: Self-pay | Admitting: Oncology

## 2017-02-27 ENCOUNTER — Encounter: Payer: Self-pay | Admitting: Family Medicine

## 2017-02-27 ENCOUNTER — Ambulatory Visit (INDEPENDENT_AMBULATORY_CARE_PROVIDER_SITE_OTHER): Payer: 59 | Admitting: Family Medicine

## 2017-02-27 VITALS — BP 122/80 | Temp 97.9°F | Wt 170.4 lb

## 2017-02-27 DIAGNOSIS — F321 Major depressive disorder, single episode, moderate: Secondary | ICD-10-CM | POA: Diagnosis not present

## 2017-02-27 DIAGNOSIS — G4730 Sleep apnea, unspecified: Secondary | ICD-10-CM | POA: Diagnosis not present

## 2017-02-27 DIAGNOSIS — K581 Irritable bowel syndrome with constipation: Secondary | ICD-10-CM | POA: Diagnosis not present

## 2017-02-27 DIAGNOSIS — Z1211 Encounter for screening for malignant neoplasm of colon: Secondary | ICD-10-CM | POA: Diagnosis not present

## 2017-02-27 DIAGNOSIS — M791 Myalgia, unspecified site: Secondary | ICD-10-CM

## 2017-02-27 DIAGNOSIS — K589 Irritable bowel syndrome without diarrhea: Secondary | ICD-10-CM | POA: Insufficient documentation

## 2017-02-27 HISTORY — DX: Irritable bowel syndrome, unspecified: K58.9

## 2017-02-27 MED ORDER — SERTRALINE HCL 50 MG PO TABS: 50.0000 mg | ORAL_TABLET | Freq: Every day | ORAL | 3 refills | Status: DC

## 2017-02-27 MED ORDER — LINACLOTIDE 72 MCG PO CAPS: 72.0000 ug | ORAL_CAPSULE | Freq: Every day | ORAL | 5 refills | Status: DC

## 2017-02-27 NOTE — Progress Notes (Signed)
Subjective:    Patient ID: [name], female    DOB: 1950-12-29, 66 y.o.   MRN: [id number]  HPI Patient in today with c/o myalgia's, swelling of body on the inside, stability issues SOB, insomnia, and constipation.  Patient has significant issues Experiencing a lot of muscle aches and discomforts Finds himself fatigued and tired Has gone through extensive testing with hematology as well as allergist as well as her gynecologist in with [place] and rheumatology So far it does appear to patient has irritable bowel constipation May have myalgia issue undiagnosed etiology Also having potential autoimmune illness She hopes to go see a specialist at [facility] and she will let us know whether or not she wants [place] to set this up  She is been having depression issues having going on for several months she is dealing with things that are going on with her husband plus her own personal life and health issues for herself [place] also she has a granddaughter has renal failure and is pregnant The patient denies being suicidal. This has been going on and off for a year.   Pain in leg is constant for the past month.  She relates bilateral leg aching doesn't seem to vary memantine but she does sitting standing moving this been going on for months progressively worse  Patient relates severe fatigue tiredness feeling sleepy throughout the day following asleep easily snores at nighttime family is her she seems to have breathing issues when she sleeps in addition to that she feels very tired and sleepy when she drives she denies any substance abuse.  She also relates a lot of bloating in her abdomen she had a ultrasound which showed a normal-looking ovary. She is had a recent CAT scan which did not show any masses.  [facility] specialist  Gwenevere Ghazi MD [phone] [facility] Review of Systems  Constitutional: Negative for activity change, appetite change and fatigue.  HENT: Negative for congestion.   Respiratory: Negative  for cough.   Cardiovascular: Negative for chest pain.  Gastrointestinal: Negative for abdominal pain.  Endocrine: Negative for polydipsia and polyphagia.  Skin: Negative for color change.  Neurological: Negative for weakness.  Psychiatric/Behavioral: Negative for confusion.       Objective:   Physical Exam  Constitutional: She appears well-developed and well-nourished. No distress.  HENT:  Head: Normocephalic and atraumatic.  Eyes: Right eye exhibits no discharge. Left eye exhibits no discharge.  Neck: No tracheal deviation present.  Cardiovascular: Normal rate, regular rhythm and normal heart sounds.   No murmur heard. Pulmonary/Chest: Effort normal and breath sounds normal. No respiratory distress. She has no wheezes. She has no rales.  Musculoskeletal: She exhibits no edema.  Lymphadenopathy:    She has no cervical adenopathy.  Neurological: She is alert. She exhibits normal muscle tone.  Skin: Skin is warm and dry. No erythema.  Psychiatric: Her behavior is normal.  Vitals reviewed. 25 minutes was spent with the patient. Greater than half the time was spent in discussion and answering questions and counseling regarding the issues that the patient came in for today.  Patient relates that she have a bowel movement every once every 7-10 days even with using MiraLAX twice a day and also using stool softeners. She feels bloated and has a lot of discomfort in her abdomen Lab work negative for anemia or iron deficiency Depression screen [facility] 2/9 02/27/2017  Decreased Interest 1  Down, Depressed, Hopeless 1  PHQ - 2 Score 2  Altered sleeping  2  Tired, decreased energy 2  Change in appetite 3  Feeling bad or failure about yourself  0  Trouble concentrating 1  Moving slowly or fidgety/restless 1  Suicidal thoughts 0  PHQ-9 Score 11  Difficult doing work/chores Not difficult at all       Assessment & Plan:  It is certainly possible she will need to see specialist at [facility] name and that there is above in the progress note  Bilateral leg pain nonspecific I doubt spinal stenosis we will watch this for now  Patient has had ANA, sedimentation rate, CK which all have been normal  Patient has what appears to be major depression without being suicidal  Also has irritable bowel with constipation will try Linzess in order to get this under better improvement  Continue stool softeners. Referral to gastroenterology for further evaluation of abdominal discomfort patient last had colonoscopy 2009 I believe the patient should go ahead with a follow-up colonoscopy

## 2017-02-28 ENCOUNTER — Telehealth: Payer: Self-pay | Admitting: *Deleted

## 2017-02-28 ENCOUNTER — Encounter: Payer: Self-pay | Admitting: Family Medicine

## 2017-02-28 NOTE — Telephone Encounter (Signed)
Message from pt - states she had spoken to Dr Beryle Beams about a referral - she has an appt at [facility] on Oct 25@ 1000 AM. And states Dr Beryle Beams would send records.

## 2017-03-02 NOTE — Telephone Encounter (Signed)
Yes I will take care of it  °

## 2017-03-04 ENCOUNTER — Telehealth: Payer: Self-pay | Admitting: Family Medicine

## 2017-03-04 ENCOUNTER — Encounter: Payer: Self-pay | Admitting: Family Medicine

## 2017-03-04 ENCOUNTER — Telehealth: Payer: Self-pay

## 2017-03-04 NOTE — Telephone Encounter (Signed)
Pt left Vm she would like to schedule colonoscopy. Returned her call and left message for a return call.

## 2017-03-04 NOTE — Telephone Encounter (Signed)
Patient's Linzess was approved from 02/28/17-02/28/2018 by [facility]

## 2017-03-07 ENCOUNTER — Other Ambulatory Visit: Payer: Self-pay | Admitting: Family Medicine

## 2017-03-11 ENCOUNTER — Other Ambulatory Visit (HOSPITAL_BASED_OUTPATIENT_CLINIC_OR_DEPARTMENT_OTHER): Payer: Self-pay

## 2017-03-11 DIAGNOSIS — R0683 Snoring: Secondary | ICD-10-CM

## 2017-03-11 DIAGNOSIS — R5383 Other fatigue: Secondary | ICD-10-CM

## 2017-03-11 DIAGNOSIS — G4710 Hypersomnia, unspecified: Secondary | ICD-10-CM

## 2017-03-14 ENCOUNTER — Ambulatory Visit: Payer: 59 | Admitting: Gastroenterology

## 2017-03-17 ENCOUNTER — Ambulatory Visit (INDEPENDENT_AMBULATORY_CARE_PROVIDER_SITE_OTHER): Payer: 59 | Admitting: *Deleted

## 2017-03-17 DIAGNOSIS — I442 Atrioventricular block, complete: Secondary | ICD-10-CM

## 2017-03-18 NOTE — Progress Notes (Signed)
Remote pacemaker transmission.   

## 2017-03-20 LAB — CUP PACEART REMOTE DEVICE CHECK
Battery Remaining Longevity: 113 mo
Battery Voltage: 3.01 V
Brady Statistic RA Percent Paced: 1 %
Implantable Lead Implant Date: 20170418
Implantable Lead Location: 753860
Implantable Pulse Generator Implant Date: 20170418
Lead Channel Impedance Value: 530 Ohm
Lead Channel Pacing Threshold Amplitude: 0.75 V
Lead Channel Pacing Threshold Pulse Width: 0.5 ms
Lead Channel Sensing Intrinsic Amplitude: 5 mV
MDC IDC LEAD IMPLANT DT: 20170418
MDC IDC LEAD LOCATION: 753859
MDC IDC MSMT BATTERY REMAINING PERCENTAGE: 95.5 %
MDC IDC MSMT LEADCHNL RV IMPEDANCE VALUE: 480 Ohm
MDC IDC MSMT LEADCHNL RV PACING THRESHOLD AMPLITUDE: 1.25 V
MDC IDC MSMT LEADCHNL RV PACING THRESHOLD PULSEWIDTH: 0.5 ms
MDC IDC MSMT LEADCHNL RV SENSING INTR AMPL: 7.2 mV
MDC IDC PG SERIAL: 7882213
MDC IDC SESS DTM: 20181015072648
MDC IDC SET LEADCHNL RA PACING AMPLITUDE: 2 V
MDC IDC SET LEADCHNL RV PACING AMPLITUDE: 2.5 V
MDC IDC SET LEADCHNL RV PACING PULSEWIDTH: 0.5 ms
MDC IDC SET LEADCHNL RV SENSING SENSITIVITY: 2 mV
MDC IDC STAT BRADY AP VP PERCENT: 1 %
MDC IDC STAT BRADY AP VS PERCENT: 1 %
MDC IDC STAT BRADY AS VP PERCENT: 99 %
MDC IDC STAT BRADY AS VS PERCENT: 1 %
MDC IDC STAT BRADY RV PERCENT PACED: 99 %

## 2017-03-21 ENCOUNTER — Encounter: Payer: Self-pay | Admitting: Cardiology

## 2017-03-21 ENCOUNTER — Ambulatory Visit (HOSPITAL_BASED_OUTPATIENT_CLINIC_OR_DEPARTMENT_OTHER): Payer: 59 | Attending: Family Medicine | Admitting: Internal Medicine

## 2017-03-21 DIAGNOSIS — G4733 Obstructive sleep apnea (adult) (pediatric): Secondary | ICD-10-CM | POA: Diagnosis not present

## 2017-03-21 DIAGNOSIS — R5383 Other fatigue: Secondary | ICD-10-CM | POA: Diagnosis not present

## 2017-03-21 DIAGNOSIS — G4719 Other hypersomnia: Secondary | ICD-10-CM | POA: Diagnosis present

## 2017-03-21 DIAGNOSIS — R0683 Snoring: Secondary | ICD-10-CM | POA: Insufficient documentation

## 2017-03-21 DIAGNOSIS — G4710 Hypersomnia, unspecified: Secondary | ICD-10-CM

## 2017-03-26 DIAGNOSIS — R0683 Snoring: Secondary | ICD-10-CM

## 2017-03-26 NOTE — Procedures (Signed)
  Patient Name: [name], Pitter Date: 03/21/2017 Gender: Female D.O.B: Dec 01, 1950 Age (years): 65 Referring Provider: [name] Height (inches): 8 Interpreting Physician: Baird Lyons MD, ABSM Weight (lbs): 169 RPSGT: Jacolyn Reedy BMI: 29 MRN: [id number] Neck Size: 14.00 CLINICAL INFORMATION Sleep Study Type: HST  Indication for sleep study: Daytime Fatigue, Excessive Daytime Sleepiness, Fatigue, Snoring  Epworth Sleepiness Score: 16  SLEEP STUDY TECHNIQUE A multi-channel overnight portable sleep study was performed. The channels recorded were: nasal airflow, thoracic respiratory movement, and oxygen saturation with a pulse oximetry. Snoring was also monitored.  MEDICATIONS Patient self administered medications include: none reported.  SLEEP ARCHITECTURE Patient was studied for 411.5 minutes. The sleep efficiency was 100.0 % and the patient was supine for 52.7%. The arousal index was 0.0 per hour.  RESPIRATORY PARAMETERS The overall AHI was 7.4 per hour, with a central apnea index of 0.0 per hour.  The oxygen nadir was 76% during sleep.  CARDIAC DATA Mean heart rate during sleep was 67.6 bpm.  IMPRESSIONS - Mild obstructive sleep apnea occurred during this study (AHI = 7.4/h). - No significant central sleep apnea occurred during this study (CAI = 0.0/h). - Oxygen desaturation was noted during this study (Min O2 = 76%, Mean 95%). - Patient snored.  DIAGNOSIS - Obstructive Sleep Apnea (327.23 [G47.33 ICD-10])  RECOMMENDATIONS - Treatment for mild obstructive sleep apnea, if necessary, is directed at symptoms. Where appropriate, weight loss and sleep position off back may be sufficient. Other options would be based on clinical judgment.. - Avoid alcohol, sedatives and other CNS depressants that may worsen sleep apnea and disrupt normal sleep architecture. - Sleep hygiene should be reviewed to assess factors that may improve sleep quality. - Weight  management and regular exercise should be initiated or continued.  [Electronically signed] 03/26/2017 03:13 PM  Baird Lyons MD, [place], American Board of Sleep Medicine   NPI: [id number]  [place], [employer]  ELECTRONICALLY SIGNED ON:  03/26/2017, 3:09 PM [place] PH: [phone]   FX: [phone] [place]

## 2017-03-28 ENCOUNTER — Ambulatory Visit: Payer: 59 | Admitting: Family Medicine

## 2017-04-02 ENCOUNTER — Other Ambulatory Visit: Payer: Self-pay | Admitting: *Deleted

## 2017-04-02 DIAGNOSIS — G4730 Sleep apnea, unspecified: Secondary | ICD-10-CM

## 2017-04-03 ENCOUNTER — Telehealth: Payer: Self-pay | Admitting: Family Medicine

## 2017-04-03 NOTE — Telephone Encounter (Signed)
Sleep study results are ready & in chart under Notes in Chart Review

## 2017-04-04 NOTE — Telephone Encounter (Signed)
Patient does have significant trouble with sleep apnea with daytime symptoms drowsiness sleepiness it should be noted that her sleep study showed AHI of 7.4.  I do believe the patient would benefit from CPAP machine and a titration study and I believe this would help her quality of life we await the results of titration study-it appears that this was put into the system a couple days ago in regards to the referral for a titration study thank you

## 2017-04-04 NOTE — Telephone Encounter (Signed)
We could order an auto CPAP and bypass the titration study if you agree, please advise

## 2017-04-08 NOTE — Telephone Encounter (Signed)
Nurses please inquire with the patient- let her know that we can order a auto titration CPAP machine and that would help her with her sleep apnea and avoid having to go through a titration study.  If the patient agrees with this please order it and discuss with [name] so appropriate paperwork can be done

## 2017-04-09 NOTE — Telephone Encounter (Signed)
I spoke with the pt and she is in agreement regarding the cpap auto titration and states her insurance changed she will fax the cards over to [place]. B[place]ale please advise, what steps I need to take for this.Thanks,CS

## 2017-04-09 NOTE — Telephone Encounter (Signed)
Sent pt a message in [place] Chart to call to be triaged for the colonoscopy.

## 2017-04-17 ENCOUNTER — Ambulatory Visit (INDEPENDENT_AMBULATORY_CARE_PROVIDER_SITE_OTHER): Payer: Medicare Other | Admitting: Family Medicine

## 2017-04-17 ENCOUNTER — Encounter: Payer: Self-pay | Admitting: Family Medicine

## 2017-04-17 VITALS — BP 138/78 | Ht 64.5 in | Wt 170.0 lb

## 2017-04-17 DIAGNOSIS — F320 Major depressive disorder, single episode, mild: Secondary | ICD-10-CM | POA: Insufficient documentation

## 2017-04-17 DIAGNOSIS — G4730 Sleep apnea, unspecified: Secondary | ICD-10-CM | POA: Diagnosis not present

## 2017-04-17 DIAGNOSIS — L501 Idiopathic urticaria: Secondary | ICD-10-CM

## 2017-04-17 MED ORDER — SERTRALINE HCL 50 MG PO TABS: 50.0000 mg | ORAL_TABLET | Freq: Every day | ORAL | 5 refills | Status: DC

## 2017-04-17 NOTE — Progress Notes (Signed)
   Subjective:    Patient ID: [name], female    DOB: 08/20/1950, 66 y.o.   MRN: [id number]  HPI  Patient is here today to follow up on sleep apnea. She had a sleep study done around a month ago. She is not on the Cpap as of yet, [place] setting this up per pt. Patient has sleep study it did show mild sleep apnea.  We are currently working on getting a CPAP machine  Depression doing better on the medicine she states her moods are doing better she is comfortable with current dosing we will continue that at least until spring time  Patient being worked up in treated for a possibility of underlying mast cell condition by [facility] specialist please see care everywhere information this was reviewed today  Review of Systems    Please see above denies fever chills sweats wheezing difficulty breathing Objective:   Physical Exam  Lungs clear heart regular pulse normal      Assessment & Plan:  Sleep apnea-CPAP machine we are acquiring it patient was counseled regarding the importance of healthy eating and sleep hygiene  Depression doing well on current medication follow-up in the spring possibly taper off at that point  Chronic idiopathic urticaria with mast cell involvement followed by [facility] may need further intervention by [place] in coordination with specialists

## 2017-04-21 NOTE — Telephone Encounter (Signed)
Faxed order & required documentation to [place] for pt's CPAP

## 2017-04-25 ENCOUNTER — Telehealth: Payer: Self-pay | Admitting: Family Medicine

## 2017-04-25 NOTE — Telephone Encounter (Signed)
FYI  CPAP  Received: 2 days ago  Message Contents  From:[place], Vaughan [facility])  TO: [name] declined CPAP due to a co pay

## 2017-04-25 NOTE — Telephone Encounter (Signed)
So noted 

## 2017-05-02 ENCOUNTER — Ambulatory Visit (INDEPENDENT_AMBULATORY_CARE_PROVIDER_SITE_OTHER): Payer: Medicare Other | Admitting: Gastroenterology

## 2017-05-02 ENCOUNTER — Encounter: Payer: Self-pay | Admitting: Gastroenterology

## 2017-05-02 ENCOUNTER — Other Ambulatory Visit: Payer: Self-pay

## 2017-05-02 VITALS — BP 115/72 | HR 78 | Temp 97.8°F | Ht 64.5 in | Wt 170.0 lb

## 2017-05-02 DIAGNOSIS — K219 Gastro-esophageal reflux disease without esophagitis: Secondary | ICD-10-CM

## 2017-05-02 DIAGNOSIS — K5900 Constipation, unspecified: Secondary | ICD-10-CM

## 2017-05-02 DIAGNOSIS — R198 Other specified symptoms and signs involving the digestive system and abdomen: Secondary | ICD-10-CM

## 2017-05-02 DIAGNOSIS — R1013 Epigastric pain: Secondary | ICD-10-CM | POA: Diagnosis not present

## 2017-05-02 MED ORDER — NA SULFATE-K SULFATE-MG SULF 17.5-3.13-1.6 GM/177ML PO SOLN: 1.0000 | ORAL | 0 refills | Status: DC

## 2017-05-02 NOTE — Progress Notes (Signed)
Primary Care Physician:  Kathyrn Drown, MD  Primary Gastroenterologist:  Barney Drain, MD   Chief Complaint  Patient presents with  . Constipation  . Bloated    x 1 yr  . Abdominal Pain    all over but feels upper quad is worse    HPI:  [name] is a 66 y.o. female here at the request of Dr. Wolfgang Phoenix for a colonoscopy.  Patient complained of abdominal pain and bloating and was asked to come in for visit scheduling colonoscopy.  Reports chronic constipation as long as she can remember even as a kid.  Symptoms worse over the past few years.  Used to control symptoms with fiber, Colace, MiraLAX but this regimen no longer works.  She does not feel like she is emptying her bowels.  2 years ago she developed a severe case of hives which became an emergency situation.  Require immediate attention through the emergency department.  For approximately 6 months she stayed with a breakout of hives.  She has been seen by specialist and determined to have too many mast cells.  Uses 4 different antihistamines to control symptoms.  When her symptoms started she noted intermittently would have abdominal swelling seem to be worse in the upper abdomen, felt extremely bloated.  She was on steroids quite a bit and gained about 15 pounds.  [facility] to [facility] for mast cell disease she saw specialist who started her on cromolyn 4 times daily for inflammation of the got related to mast cells.  Her bloating and swelling improved within 3 weeks, she was having abdominal pain during that time which stopped as well.  She still feels full in the upper abdomen however.  She has been on Linzess 72 mcg daily for about [redacted] weeks along with her fiber and MiraLAX but her constipation is still poorly managed.  If she skips one day without a bowel movement she feels miserable.  She also has terrible reflux and added Prilosec OTC but has poor control of her symptoms.  Reports peptic ulcer disease 3 decades ago.  She  had a CT abdomen with contrast June 2018 which was unremarkable.  She had a TTG IgA level that was less than 2.   Current Outpatient Medications  Medication Sig Dispense Refill  . alendronate (FOSAMAX) 70 MG tablet TAKE 1 TAB EVERY 7 DAYS WITH FULL GLASS OF WATER ON EMPTY STOMACH AND SIT UPRIGHT FOR 30 MIN 12 tablet 1  . Biotin 5000 MCG CAPS Take 5,000 mcg by mouth daily.    . Carboxymethylcellulose Sodium (THERATEARS OP) Place 5 drops into both eyes daily.    . clonazePAM (KLONOPIN) 0.5 MG tablet TAKE 1 TABLET BY MOUTH TWICE DAILY AS NEEDED FOR ANXIETY 40 tablet 2  . cromolyn (GASTROCROM) 100 MG/5ML solution Take 100 mg by mouth 4 (four) times daily -  before meals and at bedtime.     . fexofenadine (ALLEGRA) 30 MG tablet Take 30 mg by mouth 2 (two) times daily.    Marland Kitchen linaclotide (LINZESS) 72 MCG capsule Take 1 capsule (72 mcg total) by mouth daily before breakfast. 30 capsule 5  . Multiple Vitamin (MULTIVITAMIN WITH MINERALS) TABS tablet Take 1 tablet by mouth daily.    Marland Kitchen NASALCROM 5.2 MG/ACT nasal spray PLACE 1 SPRAY INTO BOTH NOSTRILS 4 (FOUR) TIMES DAILY. 26 mL 3  . PSYLLIUM PO Take 6 capsules by mouth at bedtime.    . rizatriptan (MAXALT-MLT) 10 MG disintegrating tablet TAKE ONE  TABLET BY MOUTH AT ONSET OF MIGRAINE MAY REPEAT IN 2 HOURS IF NEEDED MAX 2 PER 24 HOURS 18 tablet 5  . sertraline (ZOLOFT) 50 MG tablet Take 1 tablet (50 mg total) daily by mouth. 30 tablet 5  . topiramate (TOPAMAX) 100 MG tablet TAKE 1 TABLET (100 MG TOTAL) BY MOUTH 2 (TWO) TIMES DAILY. 180 tablet 1  . vitamin C (ASCORBIC ACID) 500 MG tablet Take 500 mg by mouth daily.      No current facility-administered medications for this visit.     Allergies as of 05/02/2017 - Review Complete 05/02/2017  Allergen Reaction Noted  . Beta adrenergic blockers Shortness Of Breath 07/03/2013  . Ambien [zolpidem tartrate] Other (See Comments) 07/12/2015  . Biaxin [clarithromycin] Hives 06/19/2015    Past Medical History:   Diagnosis Date  . Acid reflux   . Allergy   . Complete heart block ([place]) 09/2015   a. s/p [employer] Jude PPM 09/2015.  Marland Kitchen Dyspnea   . Former tobacco use   . Hyperlipidemia   . Irritable bowel syndrome (IBS) 02/27/2017  . Migraines    Chronic  . PAT (paroxysmal atrial tachycardia) ([place])   . Presence of permanent cardiac pacemaker 09/19/2015  . Sinusitis   . Urticaria     Past Surgical History:  Procedure Laterality Date  . APPENDECTOMY    . AUGMENTATION MAMMAPLASTY Bilateral   . BREAST ENHANCEMENT SURGERY  1985  . CHOLECYSTECTOMY    . COLONOSCOPY    . EP IMPLANTABLE DEVICE N/A 09/19/2015   Procedure: Pacemaker Implant;  Surgeon: Will Meredith Leeds, MD;  Location: [facility];  Service: Cardiovascular;  Laterality: N/A;  . TONSILLECTOMY AND ADENOIDECTOMY    . TUBAL LIGATION      Family History  Problem Relation Age of Onset  . Congestive Heart Failure Mother   . Diabetes Mother   . Dementia Mother   . Parkinson's disease Mother   . Lung cancer Father   . Diabetes Sister   . Diabetes Maternal Grandmother   . Allergic rhinitis Maternal Grandmother   . Asthma Maternal Grandmother   . Cancer Maternal Grandfather   . Diabetes Paternal Grandmother   . Heart attack Paternal Grandmother   . Heart disease Paternal Grandmother   . Asthma Paternal Grandmother   . Angioedema Neg Hx   . Eczema Neg Hx   . Urticaria Neg Hx     Social History   Socioeconomic History  . Marital status: Married    Spouse name: Not on file  . Number of children: Not on file  . Years of education: Not on file  . Highest education level: Not on file  Social Needs  . Financial resource strain: Not on file  . Food insecurity - worry: Not on file  . Food insecurity - inability: Not on file  . Transportation needs - medical: Not on file  . Transportation needs - non-medical: Not on file  Occupational History  . Not on file  Tobacco Use  . Smoking status: Former Smoker    Types: Cigarettes     Last attempt to quit: 06/03/2004    Years since quitting: 12.9  . Smokeless tobacco: Never Used  . Tobacco comment: quit x 11 yrs.  Substance and Sexual Activity  . Alcohol use: No  . Drug use: No  . Sexual activity: Yes    Partners: Male    Birth control/protection: Surgical  Other Topics Concern  . Not on file  Social History Narrative  .  Not on file      ROS:  General: Negative for anorexia, weight loss, fever, chills, fatigue, weakness. Eyes: Negative for vision changes.  ENT: Negative for hoarseness, difficulty swallowing , nasal congestion. CV: Negative for chest pain, angina, palpitations, dyspnea on exertion, peripheral edema.  Respiratory: Negative for dyspnea at rest, dyspnea on exertion, cough, sputum, wheezing.  GI: See history of present illness. GU:  Negative for dysuria, hematuria, urinary incontinence, urinary frequency, nocturnal urination.  MS: Negative for joint pain, low back pain.  Derm: Negative for rash or itching.  Neuro: Negative for weakness, abnormal sensation, seizure, frequent headaches, memory loss, confusion.  Psych: Negative for anxiety, depression, suicidal ideation, hallucinations.  Endo: Negative for unusual weight change.  Heme: Negative for bruising or bleeding. Allergy: Negative for rash or hives.    Physical Examination:  BP 115/72   Pulse 78   Temp 97.8 F (36.6 C) (Oral)   Ht 5' 4.5" (1.638 m)   Wt 170 lb (77.1 kg)   BMI 28.73 kg/m    General: Well-nourished, well-developed in no acute distress.  Head: Normocephalic, atraumatic.   Eyes: Conjunctiva pink, no icterus. Mouth: Oropharyngeal mucosa moist and pink , no lesions erythema or exudate. Neck: Supple without thyromegaly, masses, or lymphadenopathy.  Lungs: Clear to auscultation bilaterally.  Heart: Regular rate and rhythm, no murmurs rubs or gallops.  Abdomen: Bowel sounds are normal, nontender, nondistended, no hepatosplenomegaly or masses, no abdominal bruits or     hernia , no rebound or guarding.   Rectal: Deferred Extremities: No lower extremity edema. No clubbing or deformities.  Neuro: Alert and oriented x 4 , grossly normal neurologically.  Skin: Warm and dry, no rash or jaundice.   Psych: Alert and cooperative, normal mood and affect.  Labs: Lab Results  Component Value Date   CREATININE 0.71 10/10/2016   BUN 18 10/10/2016   NA 142 10/10/2016   K 4.5 10/10/2016   CL 104 10/10/2016   CO2 23 10/10/2016   Lab Results  Component Value Date   ALT 10 04/18/2016   AST 17 04/18/2016   ALKPHOS 54 04/18/2016   BILITOT 0.3 04/18/2016   Lab Results  Component Value Date   WBC 7.1 09/04/2016   HGB 15.1 (H) 09/04/2016   HCT 45.0 09/04/2016   MCV 92.2 09/04/2016   PLT 247 09/04/2016   Lab Results  Component Value Date   VITAMINB12 319 10/10/2016   No results found for: FOLATE  TTG <2  Imaging Studies:  CLINICAL DATA:  Upper abdominal pain and bloating for 6 months.  EXAM: CT ABDOMEN WITH CONTRAST  TECHNIQUE: Multidetector CT imaging of the abdomen was performed using the standard protocol following bolus administration of intravenous contrast.  CONTRAST:  126m ISOVUE-300 IOPAMIDOL (ISOVUE-300) INJECTION 61%  COMPARISON:  None.  FINDINGS: Lower chest:  Unremarkable.  Hepatobiliary: No focal abnormality within the liver parenchyma. Gallbladder surgically absent. No intrahepatic or extrahepatic biliary dilation.  Pancreas: No focal mass lesion. No dilatation of the main duct. No intraparenchymal cyst. No peripancreatic edema.  Spleen: No splenomegaly. No focal mass lesion.  Adrenals/Urinary Tract: No adrenal nodule or mass. Tiny hypodensity lower pole left kidney is too small to characterize but likely a cyst. Kidneys otherwise unremarkable.  Stomach/Bowel: Stomach is nondistended. No gastric wall thickening. No evidence of outlet obstruction. Duodenum is normally positioned as is the ligament of Treitz.  Visualize small bowel loops and colonic segments of the abdomen are unremarkable.  Vascular/Lymphatic: There is abdominal aortic atherosclerosis without  aneurysm. There is no gastrohepatic or hepatoduodenal ligament lymphadenopathy. No intraperitoneal or retroperitoneal lymphadenopathy.  Other: No intraperitoneal free fluid.  Musculoskeletal: Bone windows reveal no worrisome lytic or sclerotic osseous lesions.  IMPRESSION: No acute findings in the abdomen. No evidence to explain the patient's history of upper abdominal pain and bloating.   Electronically Signed   By: Misty Stanley M.D.   On: 11/01/2016 19:55     Impression/Plan:  66 year old female presenting for further evaluation of abdominal pain, need for colonoscopy as well.  Diagnosed with mast cell disease after severe case of urticaria and persisting hives.  Complained of severe abdominal bloating and discomfort with dramatic improvement after starting cromolyn 4 times daily for suspected mast cells within the intestinal tract.  Having increasing difficulty managing her constipation.  Also with poorly controlled reflux and epigastric pain and bloating which has persisted.  Would offer her colonoscopy and upper endoscopy for further evaluation of her symptoms.  Plan for MAC.  I have discussed the risks, alternatives, benefits with regards to but not limited to the risk of reaction to medication, bleeding, infection, perforation and the patient is agreeable to proceed. Written consent to be obtained.  Will increase Linzess to 145 mcg or 290 mcg daily.  Samples of both provided she will call let me know which one she wants a prescription for.  Can discontinue MiraLAX as this may be contributing to her bloating.

## 2017-05-02 NOTE — Patient Instructions (Signed)
1. Colonoscopy and upper endoscopy as scheduled. See separate instructions.  2. It is important to maintain regular bowel movements prior to your colonoscopy so that you have GOOD prep. Samples of Linzess 145 and 290 provided today. Try the 145 first to see if your BMs are better and more complete. If not, then increase to the 290. Let me know which one works best and we will send in RX.

## 2017-05-05 ENCOUNTER — Telehealth: Payer: Self-pay

## 2017-05-05 NOTE — Telephone Encounter (Signed)
Tried to call pt to inform of pre-op appt 06/06/17 at 1:45pm, no answer, LMOVM. Letter also mailed.

## 2017-05-06 ENCOUNTER — Telehealth: Payer: Self-pay | Admitting: *Deleted

## 2017-05-06 ENCOUNTER — Encounter: Payer: Self-pay | Admitting: *Deleted

## 2017-05-06 NOTE — Telephone Encounter (Signed)
Patient called in and needs to r/s TCS/EGD that is on 06/10/17. She has been r/s'd TO 06/17/17. New instructions mailed to patient. LMOVM for [employer]. Patient also needs to r/s her preop date.

## 2017-05-06 NOTE — Telephone Encounter (Signed)
Preop for 06/12/17 at 9:00am did not work for patient. I provided her with Carolyn's # to coordinate this appt with her schedule.

## 2017-05-07 ENCOUNTER — Other Ambulatory Visit: Payer: Self-pay | Admitting: Family Medicine

## 2017-05-07 ENCOUNTER — Telehealth: Payer: Self-pay | Admitting: Gastroenterology

## 2017-05-07 DIAGNOSIS — K581 Irritable bowel syndrome with constipation: Secondary | ICD-10-CM

## 2017-05-07 NOTE — Telephone Encounter (Signed)
Forwarding to refill box.  

## 2017-05-07 NOTE — Telephone Encounter (Signed)
Pt tried Linzess 290 samples and is needing a prescription called into [facility] in [place].

## 2017-05-12 MED ORDER — LINACLOTIDE 290 MCG PO CAPS: 290.0000 ug | ORAL_CAPSULE | Freq: Every day | ORAL | 3 refills | Status: DC

## 2017-05-12 NOTE — Telephone Encounter (Signed)
Rx sent to pharmacy per request.  

## 2017-05-12 NOTE — Addendum Note (Signed)
Addended by: Gordy Levan, Markia Kyer A on: 05/12/2017 12:01 PM   Modules accepted: Orders

## 2017-05-14 NOTE — Progress Notes (Signed)
CC'D TO PCP °

## 2017-05-14 NOTE — Telephone Encounter (Signed)
Pt is aware.  

## 2017-05-19 ENCOUNTER — Other Ambulatory Visit: Payer: Self-pay | Admitting: Family Medicine

## 2017-05-19 DIAGNOSIS — S93492A Sprain of other ligament of left ankle, initial encounter: Secondary | ICD-10-CM | POA: Diagnosis not present

## 2017-05-19 DIAGNOSIS — S92355D Nondisplaced fracture of fifth metatarsal bone, left foot, subsequent encounter for fracture with routine healing: Secondary | ICD-10-CM | POA: Diagnosis not present

## 2017-06-06 ENCOUNTER — Other Ambulatory Visit (HOSPITAL_COMMUNITY): Payer: Medicare Other

## 2017-06-10 NOTE — Patient Instructions (Signed)
[name]  06/10/2017     @PREFPERIOPPHARMACY @   Your procedure is scheduled on  06/17/2017 .  Report to [facility] at  [facility].  Call this number if you have problems the morning of surgery:  [phone]   Remember:  Do not eat food or drink liquids after midnight.  Take these medicines the morning of surgery with A SIP OF WATER  Klonopin, allegra, maxalt, zoloft, topamax.   Do not wear jewelry, make-up or nail polish.  Do not wear lotions, powders, or perfumes, or deodorant.  Do not shave 48 hours prior to surgery.  Men may shave face and neck.  Do not bring valuables to the hospital.  [facility] is not responsible for any belongings or valuables.  Contacts, dentures or bridgework may not be worn into surgery.  Leave your suitcase in the car.  After surgery it may be brought to your room.  For patients admitted to the hospital, discharge time will be determined by your treatment team.  Patients discharged the day of surgery will not be allowed to drive home.   Name and phone number of your driver:   family Special instructions:  Follow the diet and prep instructions given to you by Dr Nona Dell office.  Please read over the following fact sheets that you were given. Anesthesia Post-op Instructions and Care and Recovery After Surgery       Esophagogastroduodenoscopy Esophagogastroduodenoscopy (EGD) is a procedure to examine the lining of the esophagus, stomach, and first part of the small intestine (duodenum). This procedure is done to check for problems such as inflammation, bleeding, ulcers, or growths. During this procedure, a long, flexible, lighted tube with a camera attached (endoscope) is inserted down the throat. Tell a health care provider about:  Any allergies you have.  All medicines you are taking, including vitamins, herbs, eye drops, creams, and over-the-counter medicines.  Any problems you or family members have had with  anesthetic medicines.  Any blood disorders you have.  Any surgeries you have had.  Any medical conditions you have.  Whether you are pregnant or may be pregnant. What are the risks? Generally, this is a safe procedure. However, problems may occur, including:  Infection.  Bleeding.  A tear (perforation) in the esophagus, stomach, or duodenum.  Trouble breathing.  Excessive sweating.  Spasms of the larynx.  A slowed heartbeat.  Low blood pressure.  What happens before the procedure?  Follow instructions from your health care provider about eating or drinking restrictions.  Ask your health care provider about: ? Changing or stopping your regular medicines. This is especially important if you are taking diabetes medicines or blood thinners. ? Taking medicines such as aspirin and ibuprofen. These medicines can thin your blood. Do not take these medicines before your procedure if your health care provider instructs you not to.  Plan to have someone take you home after the procedure.  If you wear dentures, be ready to remove them before the procedure. What happens during the procedure?  To reduce your risk of infection, your health care team will wash or sanitize their hands.  An IV tube will be put in a vein in your hand or arm. You will get medicines and fluids through this tube.  You will be given one or more of the following: ? A medicine to help you relax (sedative). ? A medicine to numb the  area (local anesthetic). This medicine may be sprayed into your throat. It will make you feel more comfortable and keep you from gagging or coughing during the procedure. ? A medicine for pain.  A mouth guard may be placed in your mouth to protect your teeth and to keep you from biting on the endoscope.  You will be asked to lie on your left side.  The endoscope will be lowered down your throat into your esophagus, stomach, and duodenum.  Air will be put into the endoscope.  This will help your health care provider see better.  The lining of your esophagus, stomach, and duodenum will be examined.  Your health care provider may: ? Take a tissue sample so it can be looked at in a lab (biopsy). ? Remove growths. ? Remove objects (foreign bodies) that are stuck. ? Treat any bleeding with medicines or other devices that stop tissue from bleeding. ? Widen (dilate) or stretch narrowed areas of your esophagus and stomach.  The endoscope will be taken out. The procedure may vary among health care providers and hospitals. What happens after the procedure?  Your blood pressure, heart rate, breathing rate, and blood oxygen level will be monitored often until the medicines you were given have worn off.  Do not eat or drink anything until the numbing medicine has worn off and your gag reflex has returned. This information is not intended to replace advice given to you by your health care provider. Make sure you discuss any questions you have with your health care provider. Document Released: 09/20/2004 Document Revised: 10/26/2015 Document Reviewed: 04/13/2015 Elsevier Interactive Patient Education  2018 [employer]. Esophagogastroduodenoscopy, Care After Refer to this sheet in the next few weeks. These instructions provide you with information about caring for yourself after your procedure. Your health care provider may also give you more specific instructions. Your treatment has been planned according to current medical practices, but problems sometimes occur. Call your health care provider if you have any problems or questions after your procedure. What can I expect after the procedure? After the procedure, it is common to have:  A sore throat.  Nausea.  Bloating.  Dizziness.  Fatigue.  Follow these instructions at home:  Do not eat or drink anything until the numbing medicine (local anesthetic) has worn off and your gag reflex has returned. You will know  that the local anesthetic has worn off when you can swallow comfortably.  Do not drive for 24 hours if you received a medicine to help you relax (sedative).  If your health care provider took a tissue sample for testing during the procedure, make sure to get your test results. This is your responsibility. Ask your health care provider or the department performing the test when your results will be ready.  Keep all follow-up visits as told by your health care provider. This is important. Contact a health care provider if:  You cannot stop coughing.  You are not urinating.  You are urinating less than usual. Get help right away if:  You have trouble swallowing.  You cannot eat or drink.  You have throat or chest pain that gets worse.  You are dizzy or light-headed.  You faint.  You have nausea or vomiting.  You have chills.  You have a fever.  You have severe abdominal pain.  You have black, tarry, or bloody stools. This information is not intended to replace advice given to you by your health care provider. Make sure you  discuss any questions you have with your health care provider. Document Released: 05/06/2012 Document Revised: 10/26/2015 Document Reviewed: 04/13/2015 Elsevier Interactive Patient Education  2018 [employer].  Colonoscopy, Adult A colonoscopy is an exam to look at the large intestine. It is done to check for problems, such as:  Lumps (tumors).  Growths (polyps).  Swelling (inflammation).  Bleeding.  What happens before the procedure? Eating and drinking Follow instructions from your doctor about eating and drinking. These instructions may include:  A few days before the procedure - follow a low-fiber diet. ? Avoid nuts. ? Avoid seeds. ? Avoid dried fruit. ? Avoid raw fruits. ? Avoid vegetables.  1-3 days before the procedure - follow a clear liquid diet. Avoid liquids that have red or purple dye. Drink only clear liquids, such  as: ? Clear broth or bouillon. ? Black coffee or tea. ? Clear juice. ? Clear soft drinks or sports drinks. ? Gelatin dessert. ? Popsicles.  On the day of the procedure - do not eat or drink anything during the 2 hours before the procedure.  Bowel prep If you were prescribed an oral bowel prep:  Take it as told by your doctor. Starting the day before your procedure, you will need to drink a lot of liquid. The liquid will cause you to poop (have bowel movements) until your poop is almost clear or light green.  If your skin or butt gets irritated from diarrhea, you may: ? Wipe the area with wipes that have medicine in them, such as adult wet wipes with aloe and vitamin E. ? Put something on your skin that soothes the area, such as petroleum jelly.  If you throw up (vomit) while drinking the bowel prep, take a break for up to 60 minutes. Then begin the bowel prep again. If you keep throwing up and you cannot take the bowel prep without throwing up, call your doctor.  General instructions  Ask your doctor about changing or stopping your normal medicines. This is important if you take diabetes medicines or blood thinners.  Plan to have someone take you home from the hospital or clinic. What happens during the procedure?  An IV tube may be put into one of your veins.  You will be given medicine to help you relax (sedative).  To reduce your risk of infection: ? Your doctors will wash their hands. ? Your anal area will be washed with soap.  You will be asked to lie on your side with your knees bent.  Your doctor will get a long, thin, flexible tube ready. The tube will have a camera and a light on the end.  The tube will be put into your anus.  The tube will be gently put into your large intestine.  Air will be delivered into your large intestine to keep it open. You may feel some pressure or cramping.  The camera will be used to take photos.  A small tissue sample may be  removed from your body to be looked at under a microscope (biopsy). If any possible problems are found, the tissue will be sent to a lab for testing.  If small growths are found, your doctor may remove them and have them checked for cancer.  The tube that was put into your anus will be slowly removed. The procedure may vary among doctors and hospitals. What happens after the procedure?  Your doctor will check on you often until the medicines you were given have worn off.  Do not drive for 24 hours after the procedure.  You may have a small amount of blood in your poop.  You may pass gas.  You may have mild cramps or bloating in your belly (abdomen).  It is up to you to get the results of your procedure. Ask your doctor, or the department performing the procedure, when your results will be ready. This information is not intended to replace advice given to you by your health care provider. Make sure you discuss any questions you have with your health care provider. Document Released: 06/22/2010 Document Revised: 03/20/2016 Document Reviewed: 08/01/2015 Elsevier Interactive Patient Education  2017 [employer]  Colonoscopy, Adult, Care After This sheet gives you information about how to care for yourself after your procedure. Your health care provider may also give you more specific instructions. If you have problems or questions, contact your health care provider. What can I expect after the procedure? After the procedure, it is common to have:  A small amount of blood in your stool for 24 hours after the procedure.  Some gas.  Mild abdominal cramping or bloating.  Follow these instructions at home: General instructions   For the first 24 hours after the procedure: ? Do not drive or use machinery. ? Do not sign important documents. ? Do not drink alcohol. ? Do your regular daily activities at a slower pace than normal. ? Eat soft, easy-to-digest foods. ? Rest  often.  Take over-the-counter or prescription medicines only as told by your health care provider.  It is up to you to get the results of your procedure. Ask your health care provider, or the department performing the procedure, when your results will be ready. Relieving cramping and bloating  Try walking around when you have cramps or feel bloated.  Apply heat to your abdomen as told by your health care provider. Use a heat source that your health care provider recommends, such as a moist heat pack or a heating pad. ? Place a towel between your skin and the heat source. ? Leave the heat on for 20-30 minutes. ? Remove the heat if your skin turns bright red. This is especially important if you are unable to feel pain, heat, or cold. You may have a greater risk of getting burned. Eating and drinking  Drink enough fluid to keep your urine clear or pale yellow.  Resume your normal diet as instructed by your health care provider. Avoid heavy or fried foods that are hard to digest.  Avoid drinking alcohol for as long as instructed by your health care provider. Contact a health care provider if:  You have blood in your stool 2-3 days after the procedure. Get help right away if:  You have more than a small spotting of blood in your stool.  You pass large blood clots in your stool.  Your abdomen is swollen.  You have nausea or vomiting.  You have a fever.  You have increasing abdominal pain that is not relieved with medicine. This information is not intended to replace advice given to you by your health care provider. Make sure you discuss any questions you have with your health care provider. Document Released: 01/02/2004 Document Revised: 02/12/2016 Document Reviewed: 08/01/2015 Elsevier Interactive Patient Education  2018 [place] Anesthesia is a term that refers to techniques, procedures, and medicines that help a person stay safe and comfortable  during a medical procedure. Monitored anesthesia care, or sedation, is one type of  anesthesia. Your anesthesia specialist may recommend sedation if you will be having a procedure that does not require you to be unconscious, such as:  Cataract surgery.  A dental procedure.  A biopsy.  A colonoscopy.  During the procedure, you may receive a medicine to help you relax (sedative). There are three levels of sedation:  Mild sedation. At this level, you may feel awake and relaxed. You will be able to follow directions.  Moderate sedation. At this level, you will be sleepy. You may not remember the procedure.  Deep sedation. At this level, you will be asleep. You will not remember the procedure.  The more medicine you are given, the deeper your level of sedation will be. Depending on how you respond to the procedure, the anesthesia specialist may change your level of sedation or the type of anesthesia to fit your needs. An anesthesia specialist will monitor you closely during the procedure. Let your health care provider know about:  Any allergies you have.  All medicines you are taking, including vitamins, herbs, eye drops, creams, and over-the-counter medicines.  Any use of steroids (by mouth or as a cream).  Any problems you or family members have had with sedatives and anesthetic medicines.  Any blood disorders you have.  Any surgeries you have had.  Any medical conditions you have, such as sleep apnea.  Whether you are pregnant or may be pregnant.  Any use of cigarettes, alcohol, or street drugs. What are the risks? Generally, this is a safe procedure. However, problems may occur, including:  Getting too much medicine (oversedation).  Nausea.  Allergic reaction to medicines.  Trouble breathing. If this happens, a breathing tube may be used to help with breathing. It will be removed when you are awake and breathing on your own.  Heart trouble.  Lung trouble.  Before  the procedure Staying hydrated Follow instructions from your health care provider about hydration, which may include:  Up to 2 hours before the procedure - you may continue to drink clear liquids, such as water, clear fruit juice, black coffee, and plain tea.  Eating and drinking restrictions Follow instructions from your health care provider about eating and drinking, which may include:  8 hours before the procedure - stop eating heavy meals or foods such as meat, fried foods, or fatty foods.  6 hours before the procedure - stop eating light meals or foods, such as toast or cereal.  6 hours before the procedure - stop drinking milk or drinks that contain milk.  2 hours before the procedure - stop drinking clear liquids.  Medicines Ask your health care provider about:  Changing or stopping your regular medicines. This is especially important if you are taking diabetes medicines or blood thinners.  Taking medicines such as aspirin and ibuprofen. These medicines can thin your blood. Do not take these medicines before your procedure if your health care provider instructs you not to.  Tests and exams  You will have a physical exam.  You may have blood tests done to show: ? How well your kidneys and liver are working. ? How well your blood can clot.  General instructions  Plan to have someone take you home from the hospital or clinic.  If you will be going home right after the procedure, plan to have someone with you for 24 hours.  What happens during the procedure?  Your blood pressure, heart rate, breathing, level of pain and overall condition will be monitored.  An IV  tube will be inserted into one of your veins.  Your anesthesia specialist will give you medicines as needed to keep you comfortable during the procedure. This may mean changing the level of sedation.  The procedure will be performed. After the procedure  Your blood pressure, heart rate, breathing rate, and  blood oxygen level will be monitored until the medicines you were given have worn off.  Do not drive for 24 hours if you received a sedative.  You may: ? Feel sleepy, clumsy, or nauseous. ? Feel forgetful about what happened after the procedure. ? Have a sore throat if you had a breathing tube during the procedure. ? Vomit. This information is not intended to replace advice given to you by your health care provider. Make sure you discuss any questions you have with your health care provider. Document Released: 02/13/2005 Document Revised: 10/27/2015 Document Reviewed: 09/10/2015 Elsevier Interactive Patient Education  2018 [place], Care After These instructions provide you with information about caring for yourself after your procedure. Your health care provider may also give you more specific instructions. Your treatment has been planned according to current medical practices, but problems sometimes occur. Call your health care provider if you have any problems or questions after your procedure. What can I expect after the procedure? After your procedure, it is common to:  Feel sleepy for several hours.  Feel clumsy and have poor balance for several hours.  Feel forgetful about what happened after the procedure.  Have poor judgment for several hours.  Feel nauseous or vomit.  Have a sore throat if you had a breathing tube during the procedure.  Follow these instructions at home: For at least 24 hours after the procedure:   Do not: ? Participate in activities in which you could fall or become injured. ? Drive. ? Use heavy machinery. ? Drink alcohol. ? Take sleeping pills or medicines that cause drowsiness. ? Make important decisions or sign legal documents. ? Take care of children on your own.  Rest. Eating and drinking  Follow the diet that is recommended by your health care provider.  If you vomit, drink water, juice, or soup when you  can drink without vomiting.  Make sure you have little or no nausea before eating solid foods. General instructions  Have a responsible adult stay with you until you are awake and alert.  Take over-the-counter and prescription medicines only as told by your health care provider.  If you smoke, do not smoke without supervision.  Keep all follow-up visits as told by your health care provider. This is important. Contact a health care provider if:  You keep feeling nauseous or you keep vomiting.  You feel light-headed.  You develop a rash.  You have a fever. Get help right away if:  You have trouble breathing. This information is not intended to replace advice given to you by your health care provider. Make sure you discuss any questions you have with your health care provider. Document Released: 09/10/2015 Document Revised: 01/10/2016 Document Reviewed: 09/10/2015 Elsevier Interactive Patient Education  [employer].

## 2017-06-12 ENCOUNTER — Other Ambulatory Visit (HOSPITAL_COMMUNITY): Payer: Medicare Other

## 2017-06-13 ENCOUNTER — Other Ambulatory Visit: Payer: Self-pay

## 2017-06-13 ENCOUNTER — Encounter (HOSPITAL_COMMUNITY)
Admission: RE | Admit: 2017-06-13 | Discharge: 2017-06-13 | Disposition: A | Discharge status: Home / Self Care | Payer: Medicare Other | Source: Ambulatory Visit | Attending: Gastroenterology | Admitting: Gastroenterology

## 2017-06-13 ENCOUNTER — Encounter (HOSPITAL_COMMUNITY): Payer: Self-pay

## 2017-06-13 DIAGNOSIS — F419 Anxiety disorder, unspecified: Secondary | ICD-10-CM | POA: Insufficient documentation

## 2017-06-13 DIAGNOSIS — F329 Major depressive disorder, single episode, unspecified: Secondary | ICD-10-CM | POA: Diagnosis not present

## 2017-06-13 DIAGNOSIS — R51 Headache: Secondary | ICD-10-CM | POA: Insufficient documentation

## 2017-06-13 DIAGNOSIS — I499 Cardiac arrhythmia, unspecified: Secondary | ICD-10-CM | POA: Diagnosis not present

## 2017-06-13 DIAGNOSIS — Z87891 Personal history of nicotine dependence: Secondary | ICD-10-CM | POA: Diagnosis not present

## 2017-06-13 DIAGNOSIS — Z01812 Encounter for preprocedural laboratory examination: Secondary | ICD-10-CM | POA: Diagnosis not present

## 2017-06-13 DIAGNOSIS — K219 Gastro-esophageal reflux disease without esophagitis: Secondary | ICD-10-CM | POA: Diagnosis not present

## 2017-06-13 DIAGNOSIS — G4730 Sleep apnea, unspecified: Secondary | ICD-10-CM | POA: Diagnosis not present

## 2017-06-13 DIAGNOSIS — Z950 Presence of cardiac pacemaker: Secondary | ICD-10-CM | POA: Insufficient documentation

## 2017-06-13 DIAGNOSIS — R0602 Shortness of breath: Secondary | ICD-10-CM | POA: Diagnosis not present

## 2017-06-13 HISTORY — DX: Nausea with vomiting, unspecified: Z98.890

## 2017-06-13 HISTORY — DX: Nausea with vomiting, unspecified: R11.2

## 2017-06-13 HISTORY — DX: Anxiety disorder, unspecified: F41.9

## 2017-06-13 HISTORY — DX: Other mast cell neoplasms of uncertain behavior: D47.09

## 2017-06-13 LAB — CBC WITH DIFFERENTIAL/PLATELET
BASOS ABS: 0 10*3/uL (ref 0.0–0.1)
Basophils Relative: 0 %
Eosinophils Absolute: 0.2 10*3/uL (ref 0.0–0.7)
Eosinophils Relative: 3 %
HEMATOCRIT: 44.5 % (ref 36.0–46.0)
Hemoglobin: 14.3 g/dL (ref 12.0–15.0)
LYMPHS PCT: 35 %
Lymphs Abs: 2.6 10*3/uL (ref 0.7–4.0)
MCH: 30.7 pg (ref 26.0–34.0)
MCHC: 32.1 g/dL (ref 30.0–36.0)
MCV: 95.5 fL (ref 78.0–100.0)
MONOS PCT: 8 %
Monocytes Absolute: 0.6 10*3/uL (ref 0.1–1.0)
NEUTROS ABS: 4 10*3/uL (ref 1.7–7.7)
Neutrophils Relative %: 54 %
Platelets: 237 10*3/uL (ref 150–400)
RBC: 4.66 MIL/uL (ref 3.87–5.11)
RDW: 14.4 % (ref 11.5–15.5)
WBC: 7.5 10*3/uL (ref 4.0–10.5)

## 2017-06-13 LAB — BASIC METABOLIC PANEL
ANION GAP: 8 (ref 5–15)
BUN: 15 mg/dL (ref 6–20)
CALCIUM: 9.1 mg/dL (ref 8.9–10.3)
CO2: 24 mmol/L (ref 22–32)
Chloride: 108 mmol/L (ref 101–111)
Creatinine, Ser: 0.84 mg/dL (ref 0.44–1.00)
GFR calc Af Amer: >60 mL/min (ref >60)
GFR calc non Af Amer: >60 mL/min (ref >60)
GLUCOSE: 93 mg/dL (ref 65–99)
Potassium: 3.8 mmol/L (ref 3.5–5.1)
Sodium: 140 mmol/L (ref 135–145)

## 2017-06-16 ENCOUNTER — Ambulatory Visit (INDEPENDENT_AMBULATORY_CARE_PROVIDER_SITE_OTHER): Payer: Medicare Other | Admitting: *Deleted

## 2017-06-16 DIAGNOSIS — I442 Atrioventricular block, complete: Secondary | ICD-10-CM

## 2017-06-16 NOTE — Progress Notes (Signed)
CC'D TO PCP °

## 2017-06-17 ENCOUNTER — Encounter (HOSPITAL_COMMUNITY)
Admission: RE | Disposition: A | Discharge status: Home / Self Care | Payer: Self-pay | Source: Ambulatory Visit | Attending: Gastroenterology

## 2017-06-17 ENCOUNTER — Ambulatory Visit (HOSPITAL_COMMUNITY)
Admission: RE | Admit: 2017-06-17 | Discharge: 2017-06-17 | Disposition: A | Discharge status: Home / Self Care | Payer: Medicare Other | Source: Ambulatory Visit | Attending: Gastroenterology | Admitting: Gastroenterology

## 2017-06-17 ENCOUNTER — Ambulatory Visit (HOSPITAL_COMMUNITY): Payer: Medicare Other | Admitting: Anesthesiology

## 2017-06-17 ENCOUNTER — Encounter (HOSPITAL_COMMUNITY): Payer: Self-pay | Admitting: *Deleted

## 2017-06-17 DIAGNOSIS — R1011 Right upper quadrant pain: Secondary | ICD-10-CM

## 2017-06-17 DIAGNOSIS — Z950 Presence of cardiac pacemaker: Secondary | ICD-10-CM | POA: Insufficient documentation

## 2017-06-17 DIAGNOSIS — K644 Residual hemorrhoidal skin tags: Secondary | ICD-10-CM | POA: Insufficient documentation

## 2017-06-17 DIAGNOSIS — R1013 Epigastric pain: Secondary | ICD-10-CM

## 2017-06-17 DIAGNOSIS — K219 Gastro-esophageal reflux disease without esophagitis: Secondary | ICD-10-CM | POA: Insufficient documentation

## 2017-06-17 DIAGNOSIS — K648 Other hemorrhoids: Secondary | ICD-10-CM | POA: Diagnosis not present

## 2017-06-17 DIAGNOSIS — D122 Benign neoplasm of ascending colon: Secondary | ICD-10-CM | POA: Diagnosis not present

## 2017-06-17 DIAGNOSIS — R194 Change in bowel habit: Secondary | ICD-10-CM

## 2017-06-17 DIAGNOSIS — K449 Diaphragmatic hernia without obstruction or gangrene: Secondary | ICD-10-CM | POA: Insufficient documentation

## 2017-06-17 DIAGNOSIS — D128 Benign neoplasm of rectum: Secondary | ICD-10-CM | POA: Diagnosis not present

## 2017-06-17 DIAGNOSIS — Z79899 Other long term (current) drug therapy: Secondary | ICD-10-CM | POA: Insufficient documentation

## 2017-06-17 DIAGNOSIS — D123 Benign neoplasm of transverse colon: Secondary | ICD-10-CM | POA: Diagnosis not present

## 2017-06-17 DIAGNOSIS — K635 Polyp of colon: Secondary | ICD-10-CM | POA: Diagnosis not present

## 2017-06-17 DIAGNOSIS — K2970 Gastritis, unspecified, without bleeding: Secondary | ICD-10-CM

## 2017-06-17 DIAGNOSIS — K6389 Other specified diseases of intestine: Secondary | ICD-10-CM | POA: Diagnosis not present

## 2017-06-17 DIAGNOSIS — Z87891 Personal history of nicotine dependence: Secondary | ICD-10-CM | POA: Diagnosis not present

## 2017-06-17 DIAGNOSIS — K2950 Unspecified chronic gastritis without bleeding: Secondary | ICD-10-CM | POA: Diagnosis not present

## 2017-06-17 HISTORY — PX: ESOPHAGOGASTRODUODENOSCOPY (EGD) WITH PROPOFOL: SHX5813

## 2017-06-17 HISTORY — PX: BIOPSY: SHX5522

## 2017-06-17 HISTORY — DX: Sleep apnea, unspecified: G47.30

## 2017-06-17 HISTORY — PX: POLYPECTOMY: SHX5525

## 2017-06-17 HISTORY — PX: COLONOSCOPY WITH PROPOFOL: SHX5780

## 2017-06-17 SURGERY — COLONOSCOPY WITH PROPOFOL
Anesthesia: Monitor Anesthesia Care

## 2017-06-17 MED ORDER — PHENYLEPHRINE HCL 10 MG/ML IJ SOLN
INTRAMUSCULAR | Status: DC | PRN
  Administered 2017-06-17: 80 ug via INTRAVENOUS
  Administered 2017-06-17: 60 ug via INTRAVENOUS
  Administered 2017-06-17 (×2): 80 ug via INTRAVENOUS

## 2017-06-17 MED ORDER — PROPOFOL 10 MG/ML IV BOLUS
INTRAVENOUS | Status: AC
  Filled 2017-06-17: qty 40

## 2017-06-17 MED ORDER — PHENYLEPHRINE 40 MCG/ML (10ML) SYRINGE FOR IV PUSH (FOR BLOOD PRESSURE SUPPORT)
PREFILLED_SYRINGE | INTRAVENOUS | Status: AC
  Filled 2017-06-17: qty 10

## 2017-06-17 MED ORDER — PROPOFOL 10 MG/ML IV BOLUS
INTRAVENOUS | Status: AC
  Filled 2017-06-17: qty 20

## 2017-06-17 MED ORDER — PROPOFOL 10 MG/ML IV BOLUS
INTRAVENOUS | Status: DC | PRN
  Administered 2017-06-17: 20 mg via INTRAVENOUS

## 2017-06-17 MED ORDER — LACTATED RINGERS IV SOLN
INTRAVENOUS | Status: DC
  Administered 2017-06-17 (×2): via INTRAVENOUS

## 2017-06-17 MED ORDER — OMEPRAZOLE 20 MG PO CPDR
DELAYED_RELEASE_CAPSULE | ORAL | 3 refills | Status: DC
Start: 2017-06-17 — End: 2017-10-21

## 2017-06-17 MED ORDER — SPOT INK MARKER SYRINGE KIT
PACK | SUBMUCOSAL | Status: DC | PRN
  Administered 2017-06-17: 2 mL via SUBMUCOSAL

## 2017-06-17 MED ORDER — SODIUM CHLORIDE 0.9 % IJ SOLN
INTRAMUSCULAR | Status: AC
  Filled 2017-06-17: qty 10

## 2017-06-17 MED ORDER — MIDAZOLAM HCL 2 MG/2ML IJ SOLN
1.0000 mg | INTRAMUSCULAR | Status: AC
  Administered 2017-06-17: 2 mg via INTRAVENOUS

## 2017-06-17 MED ORDER — MIDAZOLAM HCL 2 MG/2ML IJ SOLN
INTRAMUSCULAR | Status: AC
  Filled 2017-06-17: qty 2

## 2017-06-17 MED ORDER — EPHEDRINE SULFATE 50 MG/ML IJ SOLN
INTRAMUSCULAR | Status: DC | PRN
  Administered 2017-06-17 (×3): 10 mg via INTRAVENOUS

## 2017-06-17 MED ORDER — EPHEDRINE SULFATE 50 MG/ML IJ SOLN
INTRAMUSCULAR | Status: AC
  Filled 2017-06-17: qty 1

## 2017-06-17 MED ORDER — LIDOCAINE VISCOUS 2 % MT SOLN
5.0000 mL | Freq: Once | OROMUCOSAL | Status: AC
  Administered 2017-06-17: 5 mL via OROMUCOSAL

## 2017-06-17 MED ORDER — SPOT INK MARKER SYRINGE KIT
PACK | SUBMUCOSAL | Status: AC
  Filled 2017-06-17: qty 5

## 2017-06-17 MED ORDER — PROPOFOL 500 MG/50ML IV EMUL
INTRAVENOUS | Status: DC | PRN
  Administered 2017-06-17: 150 ug/kg/min via INTRAVENOUS
  Administered 2017-06-17 (×4): via INTRAVENOUS

## 2017-06-17 MED ORDER — FENTANYL CITRATE (PF) 100 MCG/2ML IJ SOLN
INTRAMUSCULAR | Status: AC
  Filled 2017-06-17: qty 2

## 2017-06-17 MED ORDER — LIDOCAINE VISCOUS 2 % MT SOLN
OROMUCOSAL | Status: AC
  Filled 2017-06-17: qty 15

## 2017-06-17 MED ORDER — FENTANYL CITRATE (PF) 100 MCG/2ML IJ SOLN
25.0000 ug | Freq: Once | INTRAMUSCULAR | Status: AC
  Administered 2017-06-17: 25 ug via INTRAVENOUS

## 2017-06-17 NOTE — Anesthesia Postprocedure Evaluation (Signed)
Anesthesia Post Note  Patient: [name]  Procedure(s) Performed: COLONOSCOPY WITH PROPOFOL (N/A ) ESOPHAGOGASTRODUODENOSCOPY (EGD) WITH PROPOFOL (N/A ) BIOPSY POLYPECTOMY  Patient location during evaluation: PACU Anesthesia Type: MAC Level of consciousness: awake and alert Pain management: pain level controlled Vital Signs Assessment: post-procedure vital signs reviewed and stable Respiratory status: spontaneous breathing Cardiovascular status: stable and blood pressure returned to baseline Anesthetic complications: no Comments: Pt initially not having chest pain, but only chest tightness - unable to get a deep breath. Pt does have a headache - like her migraines. No other complaints.  12-lead EKG is unchanged. Breathing has returned to normal. Pt has taken her own 79m rizatriptan SL for control of her migraine.       Last Vitals:  Vitals:   06/17/17 1200 06/17/17 1215  BP: 112/65 (!) 109/58  Pulse:  77  Resp: 14 18  Temp: 36.5 C   SpO2: 100% 100%    Last Pain:  Vitals:   06/17/17 1200  TempSrc:   PainSc: 8                  [name]

## 2017-06-17 NOTE — H&P (Signed)
Primary Care Physician:  Luking, Scott A, MD Primary Gastroenterologist:  Dr. Fields  Pre-Procedure History & Physical: HPI:  [name] is a 66 y.o. female here for ABDOMINAL PAIN/DYSPEPSIA.  Past Medical History:  Diagnosis Date  . Acid reflux   . Allergy   . Anxiety   . Complete heart block (HCC) 09/2015   a. s/p St. Jude PPM 09/2015.  . Dyspnea   . Former tobacco use   . Hyperlipidemia   . Irritable bowel syndrome (IBS) 02/27/2017  . Mast cell disease   . Migraines    Chronic  . PAT (paroxysmal atrial tachycardia) (HCC)   . PONV (postoperative nausea and vomiting)   . Presence of permanent cardiac pacemaker 09/19/2015  . Sinusitis   . Sleep apnea   . Urticaria     Past Surgical History:  Procedure Laterality Date  . APPENDECTOMY    . AUGMENTATION MAMMAPLASTY Bilateral   . BREAST ENHANCEMENT SURGERY  1985  . CHOLECYSTECTOMY    . COLONOSCOPY    . EP IMPLANTABLE DEVICE N/A 09/19/2015   Procedure: Pacemaker Implant;  Surgeon: Will Martin Camnitz, MD;  Location: [facility];  Service: Cardiovascular;  Laterality: N/A;  . INSERT / REPLACE / REMOVE PACEMAKER    . TONSILLECTOMY AND ADENOIDECTOMY    . TUBAL LIGATION      Prior to Admission medications   Medication Sig Start Date End Date Taking? Authorizing Provider  alendronate (FOSAMAX) 70 MG tablet TAKE 1 TAB EVERY 7 DAYS WITH FULL GLASS OF WATER ON EMPTY STOMACH AND SIT UPRIGHT FOR 30 MIN 03/07/17  Yes Luking, Scott A, MD  Biotin 5000 MCG CAPS Take 5,000 mcg by mouth daily.   Yes [provider]  Carboxymethylcellulose Sodium (THERATEARS OP) Place 5 drops into both eyes daily.   Yes [provider]  clonazePAM (KLONOPIN) 0.5 MG tablet TAKE 1 TABLET BY MOUTH TWO TIMES DAILY AS NEEDED FOR ANXIETY 05/07/17  Yes Luking, Scott A, MD  fexofenadine (ALLEGRA) 30 MG tablet Take 30 mg by mouth daily.    Yes [provider]  linaclotide (LINZESS) 290 MCG CAPS capsule Take 1 capsule (290 mcg  total) by mouth daily before breakfast. 05/12/17  Yes Gill, Eric A, NP  Multiple Vitamin (MULTIVITAMIN WITH MINERALS) TABS tablet Take 1 tablet by mouth daily.   Yes [provider]  Na Sulfate-K Sulfate-Mg Sulf (SUPREP BOWEL PREP KIT) 17.5-3.13-1.6 GM/177ML SOLN Take 1 kit by mouth as directed. 05/02/17  Yes Fields, Sandi L, MD  NASALCROM 5.2 MG/ACT nasal spray PLACE 1 SPRAY INTO BOTH NOSTRILS 4 (FOUR) TIMES DAILY. 02/19/17  Yes Luking, Scott A, MD  polyethylene glycol (MIRALAX / GLYCOLAX) packet Take 17 g by mouth daily as needed for mild constipation.   Yes [provider]  rizatriptan (MAXALT-MLT) 10 MG disintegrating tablet TAKE ONE TABLET BY MOUTH AT ONSET OF MIGRAINE MAY REPEAT IN 2 HOURS IF NEEDED MAX 2 PER 24 HOURS 05/19/17  Yes Luking, Scott A, MD  sertraline (ZOLOFT) 50 MG tablet Take 1 tablet (50 mg total) daily by mouth. 04/17/17  Yes Luking, Scott A, MD  topiramate (TOPAMAX) 100 MG tablet TAKE 1 TABLET (100 MG TOTAL) BY MOUTH 2 (TWO) TIMES DAILY. 01/06/17  Yes Luking, Scott A, MD  vitamin C (ASCORBIC ACID) 500 MG tablet Take 500 mg by mouth daily.    Yes [provider]    Allergies as of 05/02/2017 - Review Complete 05/02/2017  Allergen Reaction Noted  . Beta adrenergic   blockers Shortness Of Breath 07/03/2013  . Ambien [zolpidem tartrate] Other (See Comments) 07/12/2015  . Biaxin [clarithromycin] Hives 06/19/2015    Family History  Problem Relation Age of Onset  . Congestive Heart Failure Mother   . Diabetes Mother   . Dementia Mother   . Parkinson's disease Mother   . Lung cancer Father   . Diabetes Sister   . Diabetes Maternal Grandmother   . Allergic rhinitis Maternal Grandmother   . Asthma Maternal Grandmother   . Cancer Maternal Grandfather   . Diabetes Paternal Grandmother   . Heart attack Paternal Grandmother   . Heart disease Paternal Grandmother   . Asthma Paternal Grandmother   . Angioedema Neg Hx   . Eczema Neg Hx   . Urticaria Neg  Hx     Social History   Socioeconomic History  . Marital status: Married    Spouse name: Not on file  . Number of children: Not on file  . Years of education: Not on file  . Highest education level: Not on file  Social Needs  . Financial resource strain: Not on file  . Food insecurity - worry: Not on file  . Food insecurity - inability: Not on file  . Transportation needs - medical: Not on file  . Transportation needs - non-medical: Not on file  Occupational History  . Not on file  Tobacco Use  . Smoking status: Former Smoker    Packs/day: 1.50    Years: 20.00    Pack years: 30.00    Types: Cigarettes    Last attempt to quit: 06/03/2004    Years since quitting: 13.0  . Smokeless tobacco: Never Used  . Tobacco comment: quit x 11 yrs.  Substance and Sexual Activity  . Alcohol use: No  . Drug use: No  . Sexual activity: Yes    Partners: Male    Birth control/protection: Surgical  Other Topics Concern  . Not on file  Social History Narrative  . Not on file    Review of Systems: See HPI, otherwise negative ROS   Physical Exam: Temp 97.7 F (36.5 C) (Oral)  General:   Alert,  pleasant and cooperative in NAD Head:  Normocephalic and atraumatic. Neck:  Supple; Lungs:  Clear throughout to auscultation.    Heart:  Regular rate and rhythm. Abdomen:  Soft, nontender and nondistended. Normal bowel sounds, without guarding, and without rebound.   Neurologic:  Alert and  oriented x4;  grossly normal neurologically.  Impression/Plan:     ABDOMINAL PAIN/DYSPEPSIA.  PLAN: 1. EGD/TCS TODAY DISCUSSED PROCEDURE, BENEFITS, & RISKS: < 1% chance of medication reaction, bleeding, perforation, or rupture of spleen/liver.   

## 2017-06-17 NOTE — Transfer of Care (Addendum)
Immediate Anesthesia Transfer of Care Note  Patient: [name]  Procedure(s) Performed: COLONOSCOPY WITH PROPOFOL (N/A ) ESOPHAGOGASTRODUODENOSCOPY (EGD) WITH PROPOFOL (N/A ) BIOPSY POLYPECTOMY  Patient Location: PACU  Anesthesia Type:MAC  Level of Consciousness: awake and alert   Airway & Oxygen Therapy: Patient Spontanous Breathing and Patient connected to face mask oxygen  Post-op Assessment: Report given to RN  Post vital signs: Reviewed and stable  Last Vitals:  Vitals:   06/17/17 1000 06/17/17 1005  BP: (!) 111/57 (!) 100/56  Resp: 18 (!) 23  Temp:    SpO2: 97% 92%    Last Pain:  Vitals:   06/17/17 0756  TempSrc: Oral         Complications: In recovery room started complaining uf chest pain.  O2 via facemask.  Dr. Patsey Berthold aware and 12 lead EKG ordered.

## 2017-06-17 NOTE — Progress Notes (Signed)
Patient complained of migraine. Dr. Patsey Berthold stated that it was ok for patient to take her own medication. Patient took 10 mg of rizatriptan SL and had a cup of coffee. Patient was feeling better and was taken to post op via wheelchair to be discharged home.

## 2017-06-17 NOTE — Discharge Instructions (Signed)
You have SMALL internal hemorrhoids, and HAD 6 polyps removed. I PLACED Three CLIPS IN THE RIGHT COLON TO PREVENT BLEEDING IN 7-10 DAYS. You have mild gastritis.I biopsied your stomach, SMALL BOWEL, AND COLON.    TO TREAT GASTRITIS, START OMEPRAZOLE. TAKE 30 MINUTES PRIOR TO YOUR FIRST MEAL.  FOLLOW A HIGH FIBER/LOW FAT DIET. AVOID ITEMS THAT CAUSE BLOATING. SEE INFO BELOW.  YOUR BIOPSY RESULTS WILL BE AVAILABLE IN MY CHART AFTER JAN 19 AND MY OFFICE WILL CONTACT YOU IN 10-14 DAYS WITH YOUR RESULTS.   FOLLOW UP IN 4 MOS.   NEXT COLONOSCOPY IN 1-3 YEARS.    ENDOSCOPY Care After Read the instructions outlined below and refer to this sheet in the next week. These discharge instructions provide you with general information on caring for yourself after you leave the hospital. While your treatment has been planned according to the most current medical practices available, unavoidable complications occasionally occur. If you have any problems or questions after discharge, call DR. Lathon Adan, [phone].  ACTIVITY  You may resume your regular activity, but move at a slower pace for the next 24 hours.   Take frequent rest periods for the next 24 hours.   Walking will help get rid of the air and reduce the bloated feeling in your belly (abdomen).   No driving for 24 hours (because of the medicine (anesthesia) used during the test).   You may shower.   Do not sign any important legal documents or operate any machinery for 24 hours (because of the anesthesia used during the test).    NUTRITION  Drink plenty of fluids.   You may resume your normal diet as instructed by your doctor.   Begin with a light meal and progress to your normal diet. Heavy or fried foods are harder to digest and may make you feel sick to your stomach (nauseated).   Avoid alcoholic beverages for 24 hours or as instructed.    MEDICATIONS  You may resume your normal medications.   WHAT YOU CAN EXPECT  TODAY  Some feelings of bloating in the abdomen.   Passage of more gas than usual.   Spotting of blood in your stool or on the toilet paper  .  IF YOU HAD POLYPS REMOVED DURING THE ENDOSCOPY:  Eat a soft diet IF YOU HAVE NAUSEA, BLOATING, ABDOMINAL PAIN, OR VOMITING.    FINDING OUT THE RESULTS OF YOUR TEST Not all test results are available during your visit. DR. Oneida Alar WILL CALL YOU WITHIN 14 DAYS OF YOUR PROCEDUE WITH YOUR RESULTS. Do not assume everything is normal if you have not heard from DR. Mclean Moya, CALL HER OFFICE AT [phone].  SEEK IMMEDIATE MEDICAL ATTENTION AND CALL THE OFFICE: [phone] IF:  You have more than a spotting of blood in your stool.   Your belly is swollen (abdominal distention).   You are nauseated or vomiting.   You have a temperature over 101F.   You have abdominal pain or discomfort that is severe or gets worse throughout the day.  Polyps, Colon  A polyp is extra tissue that grows inside your body. Colon polyps grow in the large intestine. The large intestine, also called the colon, is part of your digestive system. It is a long, hollow tube at the end of your digestive tract where your body makes and stores stool. Most polyps are not dangerous. They are benign. This means they are not cancerous. But over time, some types of polyps can turn into cancer. Polyps  that are smaller than a pea are usually not harmful. But larger polyps could someday become or may already be cancerous. To be safe, doctors remove all polyps and test them.   WHO GETS POLYPS? Anyone can get polyps, but certain people are more likely than others. You may have a greater chance of getting polyps if:  You are over 50.   You have had polyps before.   Someone in your family has had polyps.   Someone in your family has had cancer of the large intestine.   Find out if someone in your family has had polyps. You may also be more likely to get polyps if you:   Eat a lot of  fatty foods   Smoke   Drink alcohol   Do not exercise  Eat too much   PREVENTION There is not one sure way to prevent polyps. You might be able to lower your risk of getting them if you:  Eat more fruits and vegetables and less fatty food.   Do not smoke.   Avoid alcohol.   Exercise every day.   Lose weight if you are overweight.   Eating more calcium and folate can also lower your risk of getting polyps. Some foods that are rich in calcium are milk, cheese, and broccoli. Some foods that are rich in folate are chickpeas, kidney beans, and spinach.    Gastritis  Gastritis is an inflammation (the body's way of reacting to injury and/or infection) of the stomach. It is often caused by viral or bacterial (germ) infections. It can also be caused BY ASPIRIN, BC/GOODY POWDER'S, (IBUPROFEN) MOTRIN, OR ALEVE (NAPROXEN), chemicals (including alcohol), SPICY FOODS, and medications. This illness may be associated with generalized malaise (feeling tired, not well), UPPER ABDOMINAL STOMACH cramps, and fever. One common bacterial cause of gastritis is an organism known as H. Pylori. This can be treated with antibiotics.    High-Fiber Diet A high-fiber diet changes your normal diet to include more whole grains, legumes, fruits, and vegetables. Changes in the diet involve replacing refined carbohydrates with unrefined foods. The calorie level of the diet is essentially unchanged. The Dietary Reference Intake (recommended amount) for adult males is 38 grams per day. For adult females, it is 25 grams per day. Pregnant and lactating women should consume 28 grams of fiber per day. Fiber is the intact part of a plant that is not broken down during digestion. Functional fiber is fiber that has been isolated from the plant to provide a beneficial effect in the body. PURPOSE  Increase stool bulk.   Ease and regulate bowel movements.   Lower cholesterol.   REDUCE RISK OF COLON CANCER  INDICATIONS THAT  YOU NEED MORE FIBER  Constipation and hemorrhoids.   Uncomplicated diverticulosis (intestine condition) and irritable bowel syndrome.   Weight management.   As a protective measure against hardening of the arteries (atherosclerosis), diabetes, and cancer.   GUIDELINES FOR INCREASING FIBER IN THE DIET  Start adding fiber to the diet slowly. A gradual increase of about 5 more grams (2 slices of whole-wheat bread, 2 servings of most fruits or vegetables, or 1 bowl of high-fiber cereal) per day is best. Too rapid an increase in fiber may result in constipation, flatulence, and bloating.   Drink enough water and fluids to keep your urine clear or pale yellow. Water, juice, or caffeine-free drinks are recommended. Not drinking enough fluid may cause constipation.   Eat a variety of high-fiber foods rather than one  type of fiber.   Try to increase your intake of fiber through using high-fiber foods rather than fiber pills or supplements that contain small amounts of fiber.   The goal is to change the types of food eaten. Do not supplement your present diet with high-fiber foods, but replace foods in your present diet.   INCLUDE A VARIETY OF FIBER SOURCES  Replace refined and processed grains with whole grains, canned fruits with fresh fruits, and incorporate other fiber sources. White rice, white breads, and most bakery goods contain little or no fiber.   Brown whole-grain rice, buckwheat oats, and many fruits and vegetables are all good sources of fiber. These include: broccoli, Brussels sprouts, cabbage, cauliflower, beets, sweet potatoes, white potatoes (skin on), carrots, tomatoes, eggplant, squash, berries, fresh fruits, and dried fruits.   Cereals appear to be the richest source of fiber. Cereal fiber is found in whole grains and bran. Bran is the fiber-rich outer coat of cereal grain, which is largely removed in refining. In whole-grain cereals, the bran remains. In breakfast cereals, the  largest amount of fiber is found in those with "bran" in their names. The fiber content is sometimes indicated on the label.   You may need to include additional fruits and vegetables each day.   In baking, for 1 cup white flour, you may use the following substitutions:   1 cup whole-wheat flour minus 2 tablespoons.   1/2 cup white flour plus 1/2 cup whole-wheat flour.   Low-Fat Diet BREADS, CEREALS, PASTA, RICE, DRIED PEAS, AND BEANS These products are high in carbohydrates and most are low in fat. Therefore, they can be increased in the diet as substitutes for fatty foods. They too, however, contain calories and should not be eaten in excess. Cereals can be eaten for snacks as well as for breakfast.  Include foods that contain fiber (fruits, vegetables, whole grains, and legumes). Research shows that fiber may lower blood cholesterol levels, especially the water-soluble fiber found in fruits, vegetables, oat products, and legumes. FRUITS AND VEGETABLES It is good to eat fruits and vegetables. Besides being sources of fiber, both are rich in vitamins and some minerals. They help you get the daily allowances of these nutrients. Fruits and vegetables can be used for snacks and desserts. MEATS Limit lean meat, chicken, [place], and fish to no more than 6 ounces per day. Beef, Pork, and Lamb Use lean cuts of beef, pork, and lamb. Lean cuts include:  Extra-lean ground beef.  Arm roast.  Sirloin tip.  Center-cut ham.  Round steak.  Loin chops.  Rump roast.  Tenderloin.  Trim all fat off the outside of meats before cooking. It is not necessary to severely decrease the intake of red meat, but lean choices should be made. Lean meat is rich in protein and contains a highly absorbable form of iron. Premenopausal women, in particular, should avoid reducing lean red meat because this could increase the risk for low red blood cells (iron-deficiency anemia). The organ meats, such as liver, sweetbreads,  kidneys, and brain are very rich in cholesterol. They should be limited. Chicken and [place] These are good sources of protein. The fat of poultry can be reduced by removing the skin and underlying fat layers before cooking. Chicken and [place] can be substituted for lean red meat in the diet. Poultry should not be fried or covered with high-fat sauces. Fish and Shellfish Fish is a good source of protein. Shellfish contain cholesterol, but they usually are low in  saturated fatty acids. The preparation of fish is important. Like chicken and [place], they should not be fried or covered with high-fat sauces. EGGS Egg whites contain no fat or cholesterol. They can be eaten often. Try 1 to 2 egg whites instead of whole eggs in recipes or use egg substitutes that do not contain yolk. MILK AND DAIRY PRODUCTS Use skim or 1% milk instead of 2% or whole milk. Decrease whole milk, natural, and processed cheeses. Use nonfat or low-fat (2%) cottage cheese or low-fat cheeses made from vegetable oils. Choose nonfat or low-fat (1 to 2%) yogurt. Experiment with evaporated skim milk in recipes that call for heavy cream. Substitute low-fat yogurt or low-fat cottage cheese for sour cream in dips and salad dressings. Have at least 2 servings of low-fat dairy products, such as 2 glasses of skim (or 1%) milk each day to help get your daily calcium intake.  FATS AND OILS Reduce the total intake of fats, especially saturated fat. Butterfat, lard, and beef fats are high in saturated fat and cholesterol. These should be avoided as much as possible. Vegetable fats do not contain cholesterol, but certain vegetable fats, such as coconut oil, palm oil, and palm kernel oil are very high in saturated fats. These should be limited. These fats are often used in bakery goods, processed foods, popcorn, oils, and nondairy creamers. Vegetable shortenings and some peanut butters contain hydrogenated oils, which are also saturated fats. Read the  labels on these foods and check for saturated vegetable oils. Unsaturated vegetable oils and fats do not raise blood cholesterol. However, they should be limited because they are fats and are high in calories. Total fat should still be limited to 30% of your daily caloric intake. Desirable liquid vegetable oils are corn oil, cottonseed oil, olive oil, canola oil, safflower oil, soybean oil, and sunflower oil. Peanut oil is not as good, but small amounts are acceptable. Buy a heart-healthy tub margarine that has no partially hydrogenated oils in the ingredients. Mayonnaise and salad dressings often are made from unsaturated fats, but they should also be limited because of their high calorie and fat content. Seeds, nuts, peanut butter, olives, and avocados are high in fat, but the fat is mainly the unsaturated type. These foods should be limited mainly to avoid excess calories and fat. OTHER EATING TIPS Snacks  Most sweets should be limited as snacks. They tend to be rich in calories and fats, and their caloric content outweighs their nutritional value. Some good choices in snacks are graham crackers, melba toast, soda crackers, bagels (no egg), English muffins, fruits, and vegetables. These snacks are preferable to snack crackers, [place] fries, and chips. Popcorn should be air-popped or cooked in small amounts of liquid vegetable oil. Desserts Eat fruit, low-fat yogurt, and fruit ices. AVOID pastries, cake, and cookies. Sherbet, angel food cake, gelatin dessert, frozen low-fat yogurt, or other frozen products that do not contain saturated fat (pure fruit juice bars, frozen ice pops) are also acceptable.  COOKING METHODS Choose those methods that use little or no fat. They include: Poaching.  Braising.  Steaming.  Grilling.  Baking.  Stir-frying.  Broiling.  Microwaving.  Foods can be cooked in a nonstick pan without added fat, or use a nonfat cooking spray in regular cookware. Limit fried foods and  avoid frying in saturated fat. Add moisture to lean meats by using water, broth, cooking wines, and other nonfat or low-fat sauces along with the cooking methods mentioned above. Soups and stews should  be chilled after cooking. The fat that forms on top after a few hours in the refrigerator should be skimmed off. When preparing meals, avoid using excess salt. Salt can contribute to raising blood pressure in some people. EATING AWAY FROM HOME Order entres, potatoes, and vegetables without sauces or butter. When meat exceeds the size of a deck of cards (3 to 4 ounces), the rest can be taken home for another meal. Choose vegetable or fruit salads and ask for low-calorie salad dressings to be served on the side. Use dressings sparingly. Limit high-fat toppings, such as bacon, crumbled eggs, cheese, sunflower seeds, and olives. Ask for heart-healthy tub margarine instead of butter.   Hemorrhoids Hemorrhoids are dilated (enlarged) veins around the rectum. Sometimes clots will form in the veins. This makes them swollen and painful. These are called thrombosed hemorrhoids. Causes of hemorrhoids include:  Constipation.   Straining to have a bowel movement.   HEAVY LIFTING HOME CARE INSTRUCTIONS  Eat a well balanced diet and drink 6 to 8 glasses of water every day to avoid constipation. You may also use a bulk laxative.   Avoid straining to have bowel movements.   Keep anal area dry and clean.   Do not use a donut shaped pillow or sit on the toilet for long periods. This increases blood pooling and pain.   Move your bowels when your body has the urge; this will require less straining and will decrease pain and pressure.

## 2017-06-17 NOTE — Anesthesia Preprocedure Evaluation (Signed)
Anesthesia Evaluation  Patient identified by MRN, date of birth, ID band Patient awake    Reviewed: Allergy & Precautions, NPO status , Patient's Chart, lab work & pertinent test results  History of Anesthesia Complications (+) PONV and history of anesthetic complications  Airway Mallampati: II  TM Distance: >3 FB Neck ROM: Full    Dental  (+) Teeth Intact   Pulmonary shortness of breath, sleep apnea , former smoker,    breath sounds clear to auscultation       Cardiovascular + dysrhythmias + pacemaker ( s/p St. Jude PPM 09/2015 for complete heart block)  Rhythm:Regular Rate:Normal     Neuro/Psych  Headaches, PSYCHIATRIC DISORDERS Anxiety Depression    GI/Hepatic GERD  ,  Endo/Other    Renal/GU      Musculoskeletal   Abdominal   Peds  Hematology   Anesthesia Other Findings   Reproductive/Obstetrics                             Anesthesia Physical Anesthesia Plan  ASA: III  Anesthesia Plan: MAC   Post-op Pain Management:    Induction: Intravenous  PONV Risk Score and Plan:   Airway Management Planned: Simple Face Mask  Additional Equipment:   Intra-op Plan:   Post-operative Plan:   Informed Consent: I have reviewed the patients History and Physical, chart, labs and discussed the procedure including the risks, benefits and alternatives for the proposed anesthesia with the patient or authorized representative who has indicated his/her understanding and acceptance.     Plan Discussed with:   Anesthesia Plan Comments:         Anesthesia Quick Evaluation

## 2017-06-17 NOTE — Op Note (Signed)
[facility] Patient Name: [name] Procedure Date: 06/17/2017 11:36 AM MRN: [id number] Date of Birth: [birth year] Attending MD: Barney Drain MD, MD CSN: [id number] Age: 67 Admit Type: Outpatient Procedure:                Upper GI endoscopy WITH COLD FORCEPS BIOPSY Indications:              Abdominal pain in the right upper quadrant,                            Dyspepsia Providers:                Barney Drain MD, MD, Otis Peak B. Sharon Seller, RN,                            Randa Spike, Technician, Nelma Rothman, Technician Referring MD:             Elayne Snare. Luking Medicines:                Propofol per Anesthesia Complications:            No immediate complications. Estimated Blood Loss:     Estimated blood loss was minimal. Procedure:                Pre-Anesthesia Assessment:                           - Prior to the procedure, a History and Physical                            was performed, and patient medications and                            allergies were reviewed. The patient's tolerance of                            previous anesthesia was also reviewed. The risks                            and benefits of the procedure and the sedation                            options and risks were discussed with the patient.                            All questions were answered, and informed consent                            was obtained. Prior Anticoagulants: The patient has                            taken no previous anticoagulant or antiplatelet                            agents. ASA Grade Assessment: II - A patient with  mild systemic disease. After reviewing the risks                            and benefits, the patient was deemed in                            satisfactory condition to undergo the procedure.                            After obtaining informed consent, the endoscope was                            passed under direct vision. Throughout  the                            procedure, the patient's blood pressure, pulse, and                            oxygen saturations were monitored continuously. The                            [id number] ([id number]) scope was introduced through the                            mouth, and advanced to the second part of duodenum.                            The upper GI endoscopy was accomplished without                            difficulty. The patient tolerated the procedure                            well. Scope In: 11:42:23 AM Scope Out: 11:51:20 AM Total Procedure Duration: 0 hours 8 minutes 57 seconds  Findings:      The examined esophagus was normal.      A medium-sized hiatal hernia was present.      Patchy mild inflammation characterized by erosions and erythema was       found in the gastric antrum. Biopsies were taken with a cold forceps for       Helicobacter pylori testing.      The examined duodenum was normal. Biopsies were taken with a cold       forceps for histology. Impression:               - NO SOURCE FOR RUQ ABDOMINAL PAIN IDENTFIED                           - Medium-sized hiatal hernia.                           - MILD EROSIVE Gastritis Moderate Sedation:      Per Anesthesia Care Recommendation:           - High fiber diet and low fat diet.                           -  Continue present medications. OMEPRAZOLE 20 MG                            DAILY TO TREAT GASTRITIS.                           - Await pathology results.                           - Return to my office in 4 months.                           - Patient has a contact number available for                            emergencies. The signs and symptoms of potential                            delayed complications were discussed with the                            patient. Return to normal activities tomorrow.                            Written discharge instructions were provided to the                             patient. Procedure Code(s):        --- Professional ---                           [phone], Esophagogastroduodenoscopy, flexible,                            transoral; with biopsy, single or multiple Diagnosis Code(s):        --- Professional ---                           K44.9, Diaphragmatic hernia without obstruction or                            gangrene                           K29.70, Gastritis, unspecified, without bleeding                           R10.11, Right upper quadrant pain                           R10.13, Epigastric pain CPT copyright 2016 American Medical Association. All rights reserved. The codes documented in this report are preliminary and upon coder review may  be revised to meet current compliance requirements. Barney Drain, MD Barney Drain MD, MD 06/17/2017 12:04:00 PM This report has been signed electronically. Number of Addenda: 0

## 2017-06-17 NOTE — Op Note (Signed)
[facility] Patient Name: [name] Procedure Date: 06/17/2017 10:04 AM MRN: [id number] Date of Birth: 09-23-1950 Attending MD: Barney Drain MD, MD CSN: [id number] Age: 67 Admit Type: Outpatient Procedure:                Colonoscopy WITH EMR, COLD FORCEPS BX/COLD SNARE                            POLYPECTOMY, SQ INJECTION Indications:              Abdominal pain in the right upper quadrant, Change                            in bowel habits Providers:                Barney Drain MD, MD, Otis Peak B. Sharon Seller, RN, Nelma Rothman, Technician, Randa Spike, Technician Referring MD:             Elayne Snare. Luking Medicines:                Propofol per Anesthesia Complications:            No immediate complications. Estimated Blood Loss:     Estimated blood loss was minimal. Procedure:                Pre-Anesthesia Assessment:                           - Prior to the procedure, a History and Physical                            was performed, and patient medications and                            allergies were reviewed. The patient's tolerance of                            previous anesthesia was also reviewed. The risks                            and benefits of the procedure and the sedation                            options and risks were discussed with the patient.                            All questions were answered, and informed consent                            was obtained. Prior Anticoagulants: The patient has                            taken no previous anticoagulant or antiplatelet  agents. ASA Grade Assessment: II - A patient with                            mild systemic disease. After reviewing the risks                            and benefits, the patient was deemed in                            satisfactory condition to undergo the procedure.                           After obtaining informed consent, the colonoscope                             was passed under direct vision. Throughout the                            procedure, the patient's blood pressure, pulse, and                            oxygen saturations were monitored continuously. The                            [id number] ([id number]) scope was introduced through                            the anus and advanced to the 7 cm into the ileum.                            The colonoscopy was technically difficult and                            complex due to a tortuous colon. Successful                            completion of the procedure was aided by COLOWRAP.                            The terminal ileum, ileocecal valve, appendiceal                            orifice, and rectum were photographed. The quality                            of the bowel preparation was excellent. The patient                            tolerated the procedure well. Scope In: 10:21:38 AM Scope Out: 11:31:38 AM Scope Withdrawal Time: 1 hour 4 minutes 25 seconds  Total Procedure Duration: 1 hour 10 minutes 0 seconds  Findings:      The terminal ileum appeared normal.      A 30 mm polyp was found  in the ascending colon. The polyp was       carpet-like and flat. Preparations were made for mucosal resection. 10       CC ELEVIEW was injected to raise the lesion. Snare mucosal resection       WITH COLD FORCEPS AS WELL was performed. Resection WAS PIECEMEAL and       retrieval were complete. Area was successfully injected with 2 mL Spot       (carbon black) for tattooing. To prevent bleeding post-intervention,       three hemostatic clips were successfully placed (MR conditional). There       was no bleeding at the end of the procedure.      A 8 mm polyp was found in the ascending colon. The polyp was sessile.       The polyp was removed with a hot snare. Resection and retrieval were       complete.      Four sessile polyps were found in the rectum and transverse colon. The        polyps were 3 to 5 mm in size. These polyps were removed with a cold       snare. Resection and retrieval were complete.      External and internal hemorrhoids were found during retroflexion. The       hemorrhoids were small.      The recto-sigmoid colon and sigmoid colon were moderately redundant.       Biopsies were taken with a cold forceps for histology. Impression:               - The examined portion of the ileum was normal.                           - One 30 mm polyp in the ascending colon, removed                            with mucosal resection. Resected and retrieved.                            Injected. Clips (MR conditional) were placed.                           - One 8 mm polyp in the ascending colon, removed                            with a hot snare. Resected and retrieved.                           - Four 3 to 5 mm polyps in the rectum and in the                            transverse colon, removed with a cold snare.                            Resected and retrieved.                           - External and internal hemorrhoids.                           -  Redundant colon.                           - Mucosal resection was performed. Resection and                            retrieval were complete. Moderate Sedation:      Per Anesthesia Care Recommendation:           - Resume previous diet.                           - Continue present medications.                           - Await pathology results.                           - Repeat colonoscopy 1-3 YEARS for surveillance.                           - Return to GI office in 4 months.                           - Patient has a contact number available for                            emergencies. The signs and symptoms of potential                            delayed complications were discussed with the                            patient. Return to normal activities tomorrow.                            Written discharge  instructions were provided to the                            patient. Procedure Code(s):        --- Professional ---                           [phone], 60, Colonoscopy, flexible; with endoscopic                            mucosal resection                           [phone], Colonoscopy, flexible; with removal of                            tumor(s), polyp(s), or other lesion(s) by snare                            technique  [id number], 59, Colonoscopy, flexible; with biopsy,                            single or multiple Diagnosis Code(s):        --- Professional ---                           K64.8, Other hemorrhoids                           D12.2, Benign neoplasm of ascending colon                           K62.1, Rectal polyp                           D12.3, Benign neoplasm of transverse colon (hepatic                            flexure or splenic flexure)                           R10.11, Right upper quadrant pain                           R19.4, Change in bowel habit                           Q43.8, Other specified congenital malformations of                            intestine CPT copyright 2016 American Medical Association. All rights reserved. The codes documented in this report are preliminary and upon coder review may  be revised to meet current compliance requirements. Barney Drain, MD Barney Drain MD, MD 06/17/2017 11:41:19 AM This report has been signed electronically. Number of Addenda: 0

## 2017-06-18 NOTE — Progress Notes (Signed)
Remote pacemaker transmission.   

## 2017-06-19 LAB — CUP PACEART REMOTE DEVICE CHECK
Battery Voltage: 2.99 V
Brady Statistic AP VP Percent: 1 %
Brady Statistic AP VS Percent: 1 %
Brady Statistic AS VP Percent: 99 %
Brady Statistic AS VS Percent: 1 %
Implantable Lead Implant Date: 20170418
Lead Channel Impedance Value: 480 Ohm
Lead Channel Pacing Threshold Amplitude: 0.75 V
Lead Channel Pacing Threshold Amplitude: 1.25 V
Lead Channel Pacing Threshold Pulse Width: 0.5 ms
Lead Channel Sensing Intrinsic Amplitude: 5.6 mV
Lead Channel Setting Pacing Amplitude: 2.5 V
Lead Channel Setting Pacing Pulse Width: 0.5 ms
MDC IDC LEAD IMPLANT DT: 20170418
MDC IDC LEAD LOCATION: 753859
MDC IDC LEAD LOCATION: 753860
MDC IDC MSMT BATTERY REMAINING LONGEVITY: 113 mo
MDC IDC MSMT BATTERY REMAINING PERCENTAGE: 95.5 %
MDC IDC MSMT LEADCHNL RA IMPEDANCE VALUE: 530 Ohm
MDC IDC MSMT LEADCHNL RA PACING THRESHOLD PULSEWIDTH: 0.5 ms
MDC IDC MSMT LEADCHNL RA SENSING INTR AMPL: 5 mV
MDC IDC PG IMPLANT DT: 20170418
MDC IDC SESS DTM: 20190114070017
MDC IDC SET LEADCHNL RA PACING AMPLITUDE: 2 V
MDC IDC SET LEADCHNL RV SENSING SENSITIVITY: 2 mV
MDC IDC STAT BRADY RA PERCENT PACED: 1 %
MDC IDC STAT BRADY RV PERCENT PACED: 99 %
Pulse Gen Serial Number: 7882213

## 2017-06-19 NOTE — Progress Notes (Signed)
Pt is aware.  

## 2017-06-20 ENCOUNTER — Encounter: Payer: Self-pay | Admitting: Gastroenterology

## 2017-06-20 ENCOUNTER — Encounter: Payer: Self-pay | Admitting: Cardiology

## 2017-06-20 NOTE — Progress Notes (Signed)
ON RECALL AND APPOINTMENT MADE °

## 2017-06-21 IMAGING — CR DG CHEST 1V PORT
1 series · 1 of 1 positions shown · non-contrast
Comparison: Portable exam 1134 hours without priors for comparison.

CLINICAL DATA: Shortness of breath today

EXAM:
PORTABLE CHEST 1 VIEW

[AP]
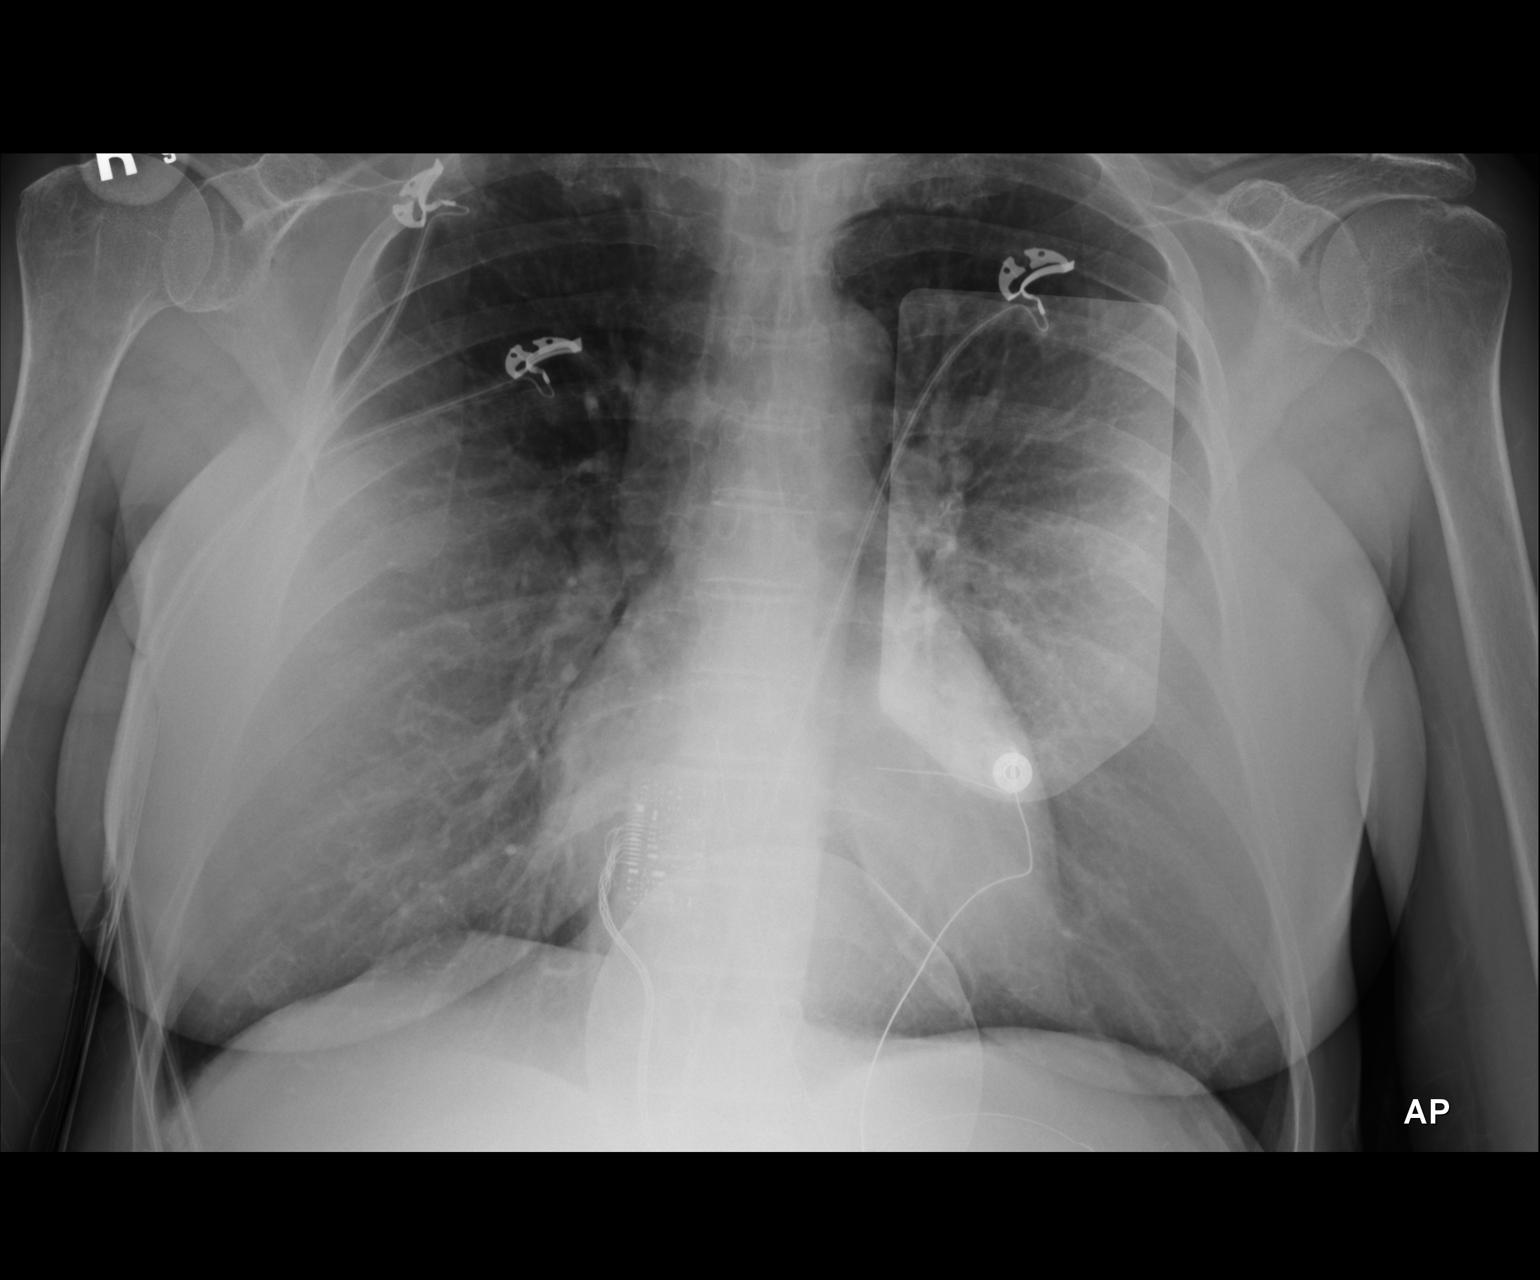

[1 of 1 positions shown; findings below may reference images not displayed]

FINDINGS: External pacing leads project over chest.

Lordotic positioning.

Normal heart size, mediastinal contours and pulmonary vascularity.

Lungs grossly clear, question hyperinflated.

No pleural effusion or pneumothorax.

Bones demineralized.

BILATERAL breast prostheses noted.
IMPRESSION: Question hyperinflation.

No acute abnormalities.

## 2017-06-24 ENCOUNTER — Encounter (HOSPITAL_COMMUNITY): Payer: Self-pay | Admitting: Gastroenterology

## 2017-07-01 ENCOUNTER — Telehealth: Payer: Self-pay

## 2017-07-01 ENCOUNTER — Other Ambulatory Visit: Payer: Self-pay | Admitting: *Deleted

## 2017-07-01 MED ORDER — TOPIRAMATE 100 MG PO TABS: ORAL_TABLET | ORAL | 0 refills | Status: DC

## 2017-07-01 NOTE — Telephone Encounter (Signed)
May have this +4 additional refills 

## 2017-07-01 NOTE — Telephone Encounter (Signed)
[facility] one BID. Last seen 04/17/2017 for sleep apnea.

## 2017-07-01 NOTE — Telephone Encounter (Signed)
Refills sent to pharm

## 2017-08-14 ENCOUNTER — Telehealth: Payer: Self-pay

## 2017-08-14 NOTE — Telephone Encounter (Signed)
This is fine.  Please adjust prescription to be this.  Inform the patient.  If her headaches are not properly controlled with this then that is a sign that the patient needs to come in and discuss other measures for potentially helping headaches.  The reason there is limitation is overusage of this medicine can increase her risk of secondary health issues.

## 2017-08-14 NOTE — Telephone Encounter (Signed)
Left message to return call 

## 2017-08-14 NOTE — Telephone Encounter (Signed)
Per [facility] pt is only allowed to have Rizatriptan 10 mg odt # 18 per Q 30 days. This medication is on pt insurance formulary,but will not provide more than the # 18 tabs Q 30 days,unless obtain a exception from [facility].

## 2017-08-15 NOTE — Telephone Encounter (Signed)
Pt states she is not trying to fill more than every 30 days. Med was on auto refill and she will talk with pharm. She is not having any trouble with headaches and having to use med more often

## 2017-08-18 ENCOUNTER — Other Ambulatory Visit: Payer: Self-pay | Admitting: Family Medicine

## 2017-09-01 ENCOUNTER — Other Ambulatory Visit: Payer: Self-pay | Admitting: Nurse Practitioner

## 2017-09-01 DIAGNOSIS — K581 Irritable bowel syndrome with constipation: Secondary | ICD-10-CM

## 2017-09-12 ENCOUNTER — Encounter: Payer: Medicare Other | Admitting: Cardiology

## 2017-09-15 ENCOUNTER — Telehealth: Payer: Self-pay | Admitting: Cardiology

## 2017-09-15 ENCOUNTER — Ambulatory Visit (INDEPENDENT_AMBULATORY_CARE_PROVIDER_SITE_OTHER): Payer: Medicare Other | Admitting: *Deleted

## 2017-09-15 DIAGNOSIS — I442 Atrioventricular block, complete: Secondary | ICD-10-CM | POA: Diagnosis not present

## 2017-09-15 NOTE — Telephone Encounter (Signed)
LMOVM reminding pt to send remote transmission.   

## 2017-09-16 NOTE — Progress Notes (Signed)
Remote pacemaker transmission.   

## 2017-09-17 ENCOUNTER — Encounter: Payer: Self-pay | Admitting: Cardiology

## 2017-09-22 LAB — CUP PACEART REMOTE DEVICE CHECK
Battery Remaining Longevity: 113 mo
Battery Voltage: 3.01 V
Brady Statistic AP VP Percent: 1 %
Brady Statistic AS VP Percent: 99 %
Brady Statistic RA Percent Paced: 1 %
Date Time Interrogation Session: 20190416025025
Implantable Lead Implant Date: 20170418
Implantable Lead Location: 753860
Implantable Pulse Generator Implant Date: 20170418
Lead Channel Impedance Value: 490 Ohm
Lead Channel Pacing Threshold Amplitude: 0.75 V
Lead Channel Pacing Threshold Pulse Width: 0.5 ms
Lead Channel Sensing Intrinsic Amplitude: 5.6 mV
Lead Channel Setting Pacing Amplitude: 2 V
Lead Channel Setting Sensing Sensitivity: 2 mV
MDC IDC LEAD IMPLANT DT: 20170418
MDC IDC LEAD LOCATION: 753859
MDC IDC MSMT BATTERY REMAINING PERCENTAGE: 95.5 %
MDC IDC MSMT LEADCHNL RA IMPEDANCE VALUE: 510 Ohm
MDC IDC MSMT LEADCHNL RA SENSING INTR AMPL: 5 mV
MDC IDC MSMT LEADCHNL RV PACING THRESHOLD AMPLITUDE: 1.25 V
MDC IDC MSMT LEADCHNL RV PACING THRESHOLD PULSEWIDTH: 0.5 ms
MDC IDC SET LEADCHNL RV PACING AMPLITUDE: 2.5 V
MDC IDC SET LEADCHNL RV PACING PULSEWIDTH: 0.5 ms
MDC IDC STAT BRADY AP VS PERCENT: 1 %
MDC IDC STAT BRADY AS VS PERCENT: 1 %
MDC IDC STAT BRADY RV PERCENT PACED: 99 %
Pulse Gen Model: 2272
Pulse Gen Serial Number: 7882213

## 2017-09-23 ENCOUNTER — Telehealth: Payer: Self-pay

## 2017-09-23 NOTE — Telephone Encounter (Signed)
Patient husband states that she has had bilateral leg and feet swelling and aching,no sob, not fevers on going for a month. Not on any diuretics.Requesting an appointment for next week.I spoke with Dr.Steve Luking ok to see next week since on going for a month now.Transferred up front to set up appointment.

## 2017-09-25 ENCOUNTER — Other Ambulatory Visit: Payer: Self-pay | Admitting: Family Medicine

## 2017-10-01 ENCOUNTER — Ambulatory Visit (INDEPENDENT_AMBULATORY_CARE_PROVIDER_SITE_OTHER): Payer: Medicare Other | Admitting: Family Medicine

## 2017-10-01 ENCOUNTER — Encounter: Payer: Self-pay | Admitting: Family Medicine

## 2017-10-01 VITALS — BP 120/82 | Ht 64.5 in | Wt 174.0 lb

## 2017-10-01 DIAGNOSIS — R5383 Other fatigue: Secondary | ICD-10-CM

## 2017-10-01 DIAGNOSIS — G609 Hereditary and idiopathic neuropathy, unspecified: Secondary | ICD-10-CM | POA: Diagnosis not present

## 2017-10-01 DIAGNOSIS — G5603 Carpal tunnel syndrome, bilateral upper limbs: Secondary | ICD-10-CM | POA: Diagnosis not present

## 2017-10-01 DIAGNOSIS — R600 Localized edema: Secondary | ICD-10-CM | POA: Diagnosis not present

## 2017-10-01 DIAGNOSIS — Z79899 Other long term (current) drug therapy: Secondary | ICD-10-CM | POA: Diagnosis not present

## 2017-10-01 MED ORDER — GABAPENTIN 100 MG PO CAPS: 100.0000 mg | ORAL_CAPSULE | Freq: Three times a day (TID) | ORAL | 4 refills | Status: DC

## 2017-10-01 NOTE — Progress Notes (Signed)
Subjective:    Patient ID: [name], female    DOB: [birth year], 67 y.o.   MRN: [id number]  HPI Patient is here today with complaints of bilateral feet edema.Also limited feeling in both feet on the bottoms.  She relates intermittently swelling in the lower legs in addition to this denies  They feel tingly and numb.Pain achey pain from shins down the top of foot ,with the right leg being worse.  She reports sometimes her body and face are puffy also.  She denies any chest tightness pressure pain or shortness of breath denies any wheezing difficulty breathing  Night pain Some swelling Decreased sensation in the feet presentr past 6 months'  Getting worse  At times fluid retention Urinating ok Eat healthy Joint pains and myalgias Patient denies any chest pressure denies any PND She does state that when she walks around she feels like there is a pad or something in between her foot and the floor She states at times it feels tingly other times it is like she cannot feel it She also relates that time she has swelling in the lower legs other times swelling in the face At times she feels like her abdomen is bloated Patient does relate a burning and aching in her lower legs that wakes her up at night makes it difficult to rest at night difficult to relax at night she is tried anti-inflammatories but these have not helped  Review of Systems  Constitutional: Negative for activity change, fatigue and fever.  HENT: Negative for congestion and rhinorrhea.   Respiratory: Negative for cough, chest tightness and shortness of breath.   Cardiovascular: Negative for chest pain and leg swelling.  Gastrointestinal: Negative for abdominal pain, diarrhea and nausea.  Skin: Negative for color change.  Neurological: Negative for headaches.  Psychiatric/Behavioral: Negative for behavioral problems.       Objective:   Physical Exam  Constitutional: She appears well-nourished. No distress.    Cardiovascular: Normal rate, regular rhythm and normal heart sounds.  No murmur heard. Pulmonary/Chest: Effort normal and breath sounds normal. No respiratory distress.  Abdominal: Soft. She exhibits no mass. There is no rebound and no guarding.  Musculoskeletal: She exhibits no edema.  Lymphadenopathy:    She has no cervical adenopathy.  Neurological: She is alert. She exhibits normal muscle tone.  Psychiatric: Her behavior is normal.  Vitals reviewed.  On exam there is no tremor there is no muscle rigidity Patient is able to move about within the exam room without difficulty On monofilament testing the patient is able to feel some areas but not other areas within her toes there is evidence of peripheral neuropathy previous labs were reviewed patient's B12 was in the low normal range patient was taking B12 supplementation but is no longer taking supplementation       Assessment & Plan:  1. Idiopathic peripheral neuropathy Patient does have some idiopathic peripheral neuropathy in her feet we do need to check a B12 level because if this is low and certainly contributing the patient does not want to do nerve conduction studies currently the patient does opt to try gabapentin she will start off 100 mg 1 a day then a week later 1 twice a day then she will give [place] an update possibly we will move up to 1 3 times a day as long as she tolerates it - Vitamin B12  2. Pedal edema Her pedal edema is not severe.  At this point in time will not  use diuretic but if it does get worse we will need to do this check lab work - Basic metabolic panel - Microalbumin / creatinine urine ratio  3. Bilateral carpal tunnel syndrome Patient states she will use splints at nighttime does not want nerve conduction studies currently  4. Other fatigue We will check B12 level this could be contributing - Vitamin B12  5. High risk medication use Check labs - Vitamin B12 - Hepatic function panel 25 minutes  spent with patient discussing neuropathy discussing evaluation of this discussing using gabapentin for neuropathic pain await the results of the test will notify patient of the results of test certainly if symptoms get worse nerve conduction studies will be next in line along with consultation possibly with neurology

## 2017-10-02 LAB — BASIC METABOLIC PANEL
BUN/Creatinine Ratio: 14 (ref 12–28)
BUN: 12 mg/dL (ref 8–27)
CALCIUM: 8.9 mg/dL (ref 8.7–10.3)
CHLORIDE: 110 mmol/L — AB (ref 96–106)
CO2: 23 mmol/L (ref 20–29)
Creatinine, Ser: 0.84 mg/dL (ref 0.57–1.00)
GFR calc Af Amer: 84 mL/min/{1.73_m2} (ref >59)
GFR calc non Af Amer: 73 mL/min/{1.73_m2} (ref >59)
GLUCOSE: 82 mg/dL (ref 65–99)
POTASSIUM: 4.1 mmol/L (ref 3.5–5.2)
Sodium: 144 mmol/L (ref 134–144)

## 2017-10-02 LAB — HEPATIC FUNCTION PANEL
ALT: 16 IU/L (ref 0–32)
AST: 19 IU/L (ref 0–40)
Albumin: 4.4 g/dL (ref 3.6–4.8)
Alkaline Phosphatase: 68 IU/L (ref 39–117)
BILIRUBIN, DIRECT: 0.05 mg/dL (ref 0.00–0.40)
Bilirubin Total: <0.2 mg/dL (ref 0.0–1.2)
Total Protein: 6.9 g/dL (ref 6.0–8.5)

## 2017-10-02 LAB — MICROALBUMIN / CREATININE URINE RATIO
CREATININE, UR: 189.5 mg/dL
MICROALB/CREAT RATIO: 4.9 mg/g{creat} (ref 0.0–30.0)
Microalbumin, Urine: 9.2 ug/mL

## 2017-10-02 LAB — VITAMIN B12: VITAMIN B 12: 380 pg/mL (ref 232–1245)

## 2017-10-19 ENCOUNTER — Other Ambulatory Visit: Payer: Self-pay | Admitting: Family Medicine

## 2017-10-20 NOTE — Telephone Encounter (Signed)
Refill each x6 

## 2017-10-21 ENCOUNTER — Encounter: Payer: Self-pay | Admitting: Gastroenterology

## 2017-10-21 ENCOUNTER — Ambulatory Visit (INDEPENDENT_AMBULATORY_CARE_PROVIDER_SITE_OTHER): Payer: Medicare Other | Admitting: Gastroenterology

## 2017-10-21 VITALS — BP 122/64 | HR 72 | Temp 97.2°F | Ht 64.5 in | Wt 175.0 lb

## 2017-10-21 DIAGNOSIS — Z860101 Personal history of adenomatous and serrated colon polyps: Secondary | ICD-10-CM | POA: Insufficient documentation

## 2017-10-21 DIAGNOSIS — K219 Gastro-esophageal reflux disease without esophagitis: Secondary | ICD-10-CM

## 2017-10-21 DIAGNOSIS — K5900 Constipation, unspecified: Secondary | ICD-10-CM | POA: Diagnosis not present

## 2017-10-21 DIAGNOSIS — K2990 Gastroduodenitis, unspecified, without bleeding: Secondary | ICD-10-CM

## 2017-10-21 DIAGNOSIS — R109 Unspecified abdominal pain: Secondary | ICD-10-CM

## 2017-10-21 DIAGNOSIS — Z86010 Personal history of colonic polyps: Secondary | ICD-10-CM | POA: Diagnosis not present

## 2017-10-21 DIAGNOSIS — K2970 Gastritis, unspecified, without bleeding: Secondary | ICD-10-CM | POA: Diagnosis not present

## 2017-10-21 MED ORDER — ESOMEPRAZOLE MAGNESIUM 40 MG PO CPDR: 40.0000 mg | DELAYED_RELEASE_CAPSULE | Freq: Every day | ORAL | 5 refills | Status: DC

## 2017-10-21 MED ORDER — LUBIPROSTONE 24 MCG PO CAPS: 24.0000 ug | ORAL_CAPSULE | Freq: Two times a day (BID) | ORAL | 0 refills | Status: DC

## 2017-10-21 NOTE — Progress Notes (Signed)
Primary Care Physician: Kathyrn Drown, MD  Primary Gastroenterologist:  Barney Drain, MD   Chief Complaint  Patient presents with  . Follow-up    still has some abd pain and swelling    HPI: MESA JANUS is a 67 y.o. female here for follow-up.  She was seen back in November 2018 to schedule colonoscopy.  She had complained of abdominal pain and bloating, chronic constipation dating back her entire life.  When I saw the patient she had been diagnosed with mast cell disease after developing severe urticaria and persisting hives.  She saw specialist at [facility].  She was put on cromolyn.  It was also suspected that she had mast cells within the intestinal tract contributing to her bloating and abdominal pain.  She did note improvement of her symptoms on cromolyn.  She was started on Linzess for chronic constipation, she tried 145 and 290 mcg samples and prescription ultimately called in for 290 mcg.  EGD/TCS done in 06/2017. She had medium sized hiatal hernia, chronic mild inactive gastritis without H. pylori.  Small bowel biopsy negative.  Terminal ileum was normal.  30 mm polyp in the ascending colon resection via piecemeal, tattooed, pathology revealed tubular adenoma.  She also had an 8 mm polyp in the ascending colon, 4 smaller polyp in the rectum and transverse colon which were tubular adenomas.  Random colon biopsies negative.  Plans for follow-up colonoscopy in 3 years.  Overall patient believes she is improved.  Her abdominal pain is no longer constant.  She still notes right-sided pain after meals, she is not sure if it is positional but does note that she uses a stand-up desk at times with improvement.  Sometimes her abdominal pain improves if she stretches out.  For the most part her reflux is better controlled on omeprazole but she continues to have occasional breakthrough symptoms and rarely vomiting with it.  Takes Tums in addition to her omeprazole at times.  Bowel function  continues to be a problem.  She is on Linzess 290 mcg daily.  She also takes 6 psyllium capsules and 2 Colace daily.  If she does not have a daily bowel movement she develops severe abdominal pain and bloating. She continues to skip days without BMs on a regular basis.  Denies melena or rectal bleeding.   Current Outpatient Medications  Medication Sig Dispense Refill  . alendronate (FOSAMAX) 70 MG tablet TAKE 1 TAB EVERY 7 DAYS WITH FULL GLASS OF WATER ON EMPTY STOMACH AND SIT UPRIGHT FOR 30 MIN 12 tablet 1  . Biotin 5000 MCG CAPS Take 5,000 mcg by mouth daily.    . Carboxymethylcellulose Sodium (THERATEARS OP) Place 5 drops into both eyes daily.    . clonazePAM (KLONOPIN) 0.5 MG tablet TAKE ONE TABLET BY MOUTH TWICE DAILY AS NEEDED FOR ANXIETY 40 tablet 5  . docusate sodium (COLACE) 100 MG capsule Take 100 mg by mouth daily.    . fexofenadine (ALLEGRA) 30 MG tablet Take 30 mg by mouth daily.     Marland Kitchen gabapentin (NEURONTIN) 100 MG capsule Take 1 capsule (100 mg total) by mouth 3 (three) times daily. 90 capsule 4  . LINZESS 290 MCG CAPS capsule TAKE 1 CAPSULE (290 MCG TOTAL) BY MOUTH DAILY BEFORE BREAKFAST. 90 capsule 3  . Multiple Vitamin (MULTIVITAMIN WITH MINERALS) TABS tablet Take 1 tablet by mouth daily.    Marland Kitchen NASALCROM 5.2 MG/ACT nasal spray PLACE 1 SPRAY INTO BOTH NOSTRILS 4 (FOUR) TIMES  DAILY. 26 mL 3  . omeprazole (PRILOSEC) 20 MG capsule 1 PO 30 MINS PRIOR TO BREAKFAST. 90 capsule 3  . OVER THE COUNTER MEDICATION B12 1000 mcg one daily    . psyllium (REGULOID) 0.52 g capsule Take 0.52 g by mouth daily.    . rizatriptan (MAXALT-MLT) 10 MG disintegrating tablet TAKE ONE TABLET BY MOUTH AT ONSET OF MIGRAINE MAY REPEAT IN 2 HOURS IF NEEDED MAX 2 PER 24 HOURS 18 tablet 4  . sertraline (ZOLOFT) 50 MG tablet TAKE 1 TABLET (50 MG TOTAL) DAILY BY MOUTH. 30 tablet 5  . topiramate (TOPAMAX) 100 MG tablet TAKE 1 TABLET BY MOUTH TWICE A DAY 180 tablet 0  . vitamin C (ASCORBIC ACID) 500 MG tablet Take  500 mg by mouth daily.      No current facility-administered medications for this visit.     Allergies as of 10/21/2017 - Review Complete 10/21/2017  Allergen Reaction Noted  . Beta adrenergic blockers Shortness Of Breath 07/03/2013  . Ambien [zolpidem tartrate] Other (See Comments) 07/12/2015  . Biaxin [clarithromycin] Hives 06/19/2015    ROS:  General: Negative for anorexia, weight loss, fever, chills, fatigue, weakness. ENT: Negative for hoarseness, difficulty swallowing , nasal congestion. CV: Negative for chest pain, angina, palpitations, dyspnea on exertion, peripheral edema.  Respiratory: Negative for dyspnea at rest, dyspnea on exertion, cough, sputum, wheezing.  GI: See history of present illness. GU:  Negative for dysuria, hematuria, urinary incontinence, urinary frequency, nocturnal urination.  Endo: Negative for unusual weight change.    Physical Examination:   BP 122/64   Pulse 72   Temp (!) 97.2 F (36.2 C) (Oral)   Ht 5' 4.5" (1.638 m)   Wt 175 lb (79.4 kg)   BMI 29.57 kg/m   General: Well-nourished, well-developed in no acute distress.  Eyes: No icterus. Mouth: Oropharyngeal mucosa moist and pink , no lesions erythema or exudate. Lungs: Clear to auscultation bilaterally.  Heart: Regular rate and rhythm, no murmurs rubs or gallops.  Abdomen: Bowel sounds are normal, mild epigastric and right mid abdominal tenderness, nondistended, no hepatosplenomegaly or masses, no abdominal bruits or hernia , no rebound or guarding.   Extremities: No lower extremity edema. No clubbing or deformities. Neuro: Alert and oriented x 4   Skin: Warm and dry, no jaundice.   Psych: Alert and cooperative, normal mood and affect.  Labs:  Lab Results  Component Value Date   CREATININE 0.84 10/01/2017   BUN 12 10/01/2017   NA 144 10/01/2017   K 4.1 10/01/2017   CL 110 (H) 10/01/2017   CO2 23 10/01/2017   Lab Results  Component Value Date   ALT 16 10/01/2017   AST 19  10/01/2017   ALKPHOS 68 10/01/2017   BILITOT <0.2 10/01/2017   Lab Results  Component Value Date   VITAMINB12 380 10/01/2017   Lab Results  Component Value Date   WBC 7.5 06/13/2017   HGB 14.3 06/13/2017   HCT 44.5 06/13/2017   MCV 95.5 06/13/2017   PLT 237 06/13/2017    Imaging Studies: No results found.

## 2017-10-21 NOTE — Patient Instructions (Signed)
1. Stop omeprazole. Start generic Nexium once daily before breakfast. It appears that this is now covered by your insurance. I sent in RX for 30 days to see if covered. If you prefer 90 day supply we can send in for future refills. Please let me know.  2. Stop fiber capsules and stool softener. Stop Linzess. Start Amitiza 64mcg twice daily with food. Samples provided. Let me know if help and we can send in RX.  3. Call in 2-3 weeks with progress report.  4. Return office visit in one year or sooner if needed.

## 2017-10-21 NOTE — Assessment & Plan Note (Signed)
Better control but requiring multiple agents to keep bowels moving. Still with bloating and discomfort if goes 24 hours without BM. Wants to try another medication. Will stop fiber capsules for now, stop colace, stop linzess. Start Amitiza 26mcg bid. Samples provided. If helpful, she will call for RX otherwise she will call to request a different medication.  Return office visit in 1 year.

## 2017-10-21 NOTE — Progress Notes (Signed)
cc'ed to pcp °

## 2017-10-21 NOTE — Assessment & Plan Note (Signed)
Extensive evaluation and work-up as previously outlined.  Describes both postprandial component as well as positional, notes improvement when she stretches out or stands up.  Given postprandial component, we will switch her PPI from omeprazole to Nexium to see if she gains any relief.  She will call with a progress for a few weeks.  Otherwise we will see her back in a year.

## 2017-10-21 NOTE — Assessment & Plan Note (Signed)
Next colonoscopy in January 2022.

## 2017-10-23 ENCOUNTER — Other Ambulatory Visit: Payer: Self-pay | Admitting: Family Medicine

## 2017-10-24 ENCOUNTER — Encounter: Payer: Self-pay | Admitting: Family Medicine

## 2017-10-24 ENCOUNTER — Encounter: Payer: Self-pay | Admitting: Gastroenterology

## 2017-10-28 ENCOUNTER — Other Ambulatory Visit: Payer: Self-pay | Admitting: Gastroenterology

## 2017-10-28 MED ORDER — LUBIPROSTONE 24 MCG PO CAPS: 24.0000 ug | ORAL_CAPSULE | Freq: Two times a day (BID) | ORAL | 3 refills | Status: DC

## 2017-10-30 ENCOUNTER — Encounter: Payer: Self-pay | Admitting: Cardiology

## 2017-10-30 ENCOUNTER — Ambulatory Visit (INDEPENDENT_AMBULATORY_CARE_PROVIDER_SITE_OTHER): Payer: Medicare Other | Admitting: Cardiology

## 2017-10-30 VITALS — BP 120/84 | HR 76 | Ht 64.5 in | Wt 175.0 lb

## 2017-10-30 DIAGNOSIS — I442 Atrioventricular block, complete: Secondary | ICD-10-CM | POA: Diagnosis not present

## 2017-10-30 DIAGNOSIS — E785 Hyperlipidemia, unspecified: Secondary | ICD-10-CM

## 2017-10-30 NOTE — Progress Notes (Signed)
Electrophysiology Office Note   Date:  10/30/2017   ID:  [name], DOB 01/28/1951, MRN [id number]  PCP:  Kathyrn Drown, MD  Primary Electrophysiologist:  Constance Haw, MD    Chief Complaint  Patient presents with  . Pacemaker Check    Complete heart block     History of Present Illness: [name] is a 67 y.o. female who presents today for electrophysiology evaluation.   Presented to the hospital after being found in complete heart block.  St. Jude dual chamber pacemaker placed 09/19/15.   Today, denies symptoms of palpitations, chest pain, shortness of breath, orthopnea, PND, lower extremity edema, claudication, dizziness, presyncope, syncope, bleeding, or neurologic sequela. The patient is tolerating medications without difficulties.  He is overall feeling well.  She has had some issues with fatigue, leg pain, leg swelling, and facial swelling.  She is getting worked up for peripheral neuropathy currently.  She has a nerve conduction study pending.   Past Medical History:  Diagnosis Date  . Acid reflux   . Allergy   . Anxiety   . Complete heart block ([place]) 09/2015   a. s/p St. Jude PPM 09/2015.  Marland Kitchen Dyspnea   . Former tobacco use   . Hyperlipidemia   . Irritable bowel syndrome (IBS) 02/27/2017  . Mast cell disease   . Migraines    Chronic  . PAT (paroxysmal atrial tachycardia) ([place])   . PONV (postoperative nausea and vomiting)   . Presence of permanent cardiac pacemaker 09/19/2015  . Sinusitis   . Sleep apnea   . Urticaria    Past Surgical History:  Procedure Laterality Date  . APPENDECTOMY    . AUGMENTATION MAMMAPLASTY Bilateral   . BIOPSY  06/17/2017   Procedure: BIOPSY;  Surgeon: Danie Binder, MD;  Location: [facility];  Service: Endoscopy;;  colon  . BREAST ENHANCEMENT SURGERY  1985  . CHOLECYSTECTOMY    . COLONOSCOPY    . COLONOSCOPY WITH PROPOFOL N/A 06/17/2017   Dr. Oneida Alar: 30 mm polyp removed from the ascending colon piecemeal,  tattooed, tubular adenoma.  8 mm polyp in the ascending colon, four 3 to 5 mm polyps removed from the rectum and transverse colon.  Tubular adenomas.  External and internal hemorrhoids.  Redundant colon.  Random colon biopsies negative.  Next colonoscopy in 3 years.  . EP IMPLANTABLE DEVICE N/A 09/19/2015   Procedure: Pacemaker Implant;  Surgeon: Kylia Grajales Meredith Leeds, MD;  Location: [facility];  Service: Cardiovascular;  Laterality: N/A;  . ESOPHAGOGASTRODUODENOSCOPY (EGD) WITH PROPOFOL N/A 06/17/2017   Dr. Oneida Alar: Medium sized hiatal hernia, mild erosive gastritis, small bowel biopsies negative for celiac.  Gastric biopsies with chronic mild inactive gastritis but no H. pylori.  Randolm Idol / REPLACE / REMOVE PACEMAKER    . POLYPECTOMY  06/17/2017   Procedure: POLYPECTOMY;  Surgeon: Danie Binder, MD;  Location: [facility];  Service: Endoscopy;;  colon  . TONSILLECTOMY AND ADENOIDECTOMY    . TUBAL LIGATION       Current Outpatient Medications  Medication Sig Dispense Refill  . alendronate (FOSAMAX) 70 MG tablet TAKE 1 TAB EVERY 7 DAYS WITH FULL GLASS OF WATER ON EMPTY STOMACH AND SIT UPRIGHT FOR 30 MIN 12 tablet 1  . Biotin 5000 MCG CAPS Take 5,000 mcg by mouth daily.    . Carboxymethylcellulose Sodium (THERATEARS OP) Place 5 drops into both eyes daily.    . clonazePAM (KLONOPIN) 0.5 MG tablet TAKE ONE TABLET  BY MOUTH TWICE DAILY AS NEEDED FOR ANXIETY 40 tablet 5  . esomeprazole (NEXIUM) 40 MG capsule Take 1 capsule (40 mg total) by mouth daily before breakfast. 30 capsule 5  . fexofenadine (ALLEGRA) 30 MG tablet Take 30 mg by mouth daily.     Marland Kitchen gabapentin (NEURONTIN) 100 MG capsule TAKE 1 CAPSULE BY MOUTH THREE TIMES A DAY 270 capsule 2  . lubiprostone (AMITIZA) 24 MCG capsule Take 1 capsule (24 mcg total) by mouth 2 (two) times daily with a meal. 180 capsule 3  . Multiple Vitamin (MULTIVITAMIN WITH MINERALS) TABS tablet Take 1 tablet by mouth daily.    Marland Kitchen NASALCROM 5.2 MG/ACT nasal  spray PLACE 1 SPRAY INTO BOTH NOSTRILS 4 (FOUR) TIMES DAILY. 26 mL 3  . OVER THE COUNTER MEDICATION B12 1000 mcg one daily    . rizatriptan (MAXALT-MLT) 10 MG disintegrating tablet TAKE ONE TABLET BY MOUTH AT ONSET OF MIGRAINE MAY REPEAT IN 2 HOURS IF NEEDED MAX 2 PER 24 HOURS 18 tablet 4  . sertraline (ZOLOFT) 50 MG tablet TAKE 1 TABLET (50 MG TOTAL) DAILY BY MOUTH. 30 tablet 5  . topiramate (TOPAMAX) 100 MG tablet TAKE 1 TABLET BY MOUTH TWICE A DAY 180 tablet 0  . vitamin C (ASCORBIC ACID) 500 MG tablet Take 500 mg by mouth daily.      No current facility-administered medications for this visit.     Allergies:   Beta adrenergic blockers; Ambien [zolpidem tartrate]; and Biaxin [clarithromycin]   Social History:  The patient  reports that she quit smoking about 13 years ago. Her smoking use included cigarettes. She has a 30.00 pack-year smoking history. She has never used smokeless tobacco. She reports that she does not drink alcohol or use drugs.   Family History:  The patient's family history includes Allergic rhinitis in her maternal grandmother; Asthma in her maternal grandmother and paternal grandmother; Cancer in her maternal grandfather; Congestive Heart Failure in her mother; Dementia in her mother; Diabetes in her maternal grandmother, mother, paternal grandmother, and sister; Heart attack in her paternal grandmother; Heart disease in her paternal grandmother; Lung cancer in her father; Parkinson's disease in her mother.    ROS:  Please see the history of present illness.   Otherwise, review of systems is positive for appetite change, fatigue, leg pain, leg swelling, facial swelling.   All other systems are reviewed and negative.   PHYSICAL EXAM: VS:  BP 120/84   Pulse 76   Ht 5' 4.5" (1.638 m)   Wt 175 lb (79.4 kg)   SpO2 96%   BMI 29.57 kg/m  , BMI Body mass index is 29.57 kg/m. GEN: Well nourished, well developed, in no acute distress  HEENT: normal  Neck: no JVD, carotid  bruits, or masses Cardiac: RRR; no murmurs, rubs, or gallops,no edema  Respiratory:  clear to auscultation bilaterally, normal work of breathing GI: soft, nontender, nondistended, + BS MS: no deformity or atrophy  Skin: warm and dry, device site well healed Neuro:  Strength and sensation are intact Psych: euthymic mood, full affect  EKG:  EKG is not ordered today. Personal review of the ekg ordered 06/17/17 shows A sense, V paced  Personal review of the device interrogation today. Results in [place]: 06/13/2017: Hemoglobin 14.3; Platelets 237 10/01/2017: ALT 16; BUN 12; Creatinine, Ser 0.84; Potassium 4.1; Sodium 144    Lipid Panel     Component Value Date/Time   CHOL 214 (H) 01/19/2014 0626  TRIG 67 01/19/2014 0918   HDL 91 01/19/2014 0918   CHOLHDL 2.4 01/19/2014 0918   VLDL 13 01/19/2014 0918   LDLCALC 110 (H) 01/19/2014 0918     Wt Readings from Last 3 Encounters:  10/30/17 175 lb (79.4 kg)  10/21/17 175 lb (79.4 kg)  10/01/17 174 lb (78.9 kg)      Other studies Reviewed: Additional studies/ records that were reviewed today include: TTE 01/23/16 - Left ventricle: The cavity size was normal. Wall thickness was   normal. Systolic function was normal. The estimated ejection   fraction was in the range of 55% to 60%. Wall motion was normal;   there were no regional wall motion abnormalities. Doppler   parameters are consistent with abnormal left ventricular   relaxation (grade 1 diastolic dysfunction). - Aortic valve: Trileaflet; mildly calcified leaflets. - Right ventricle: Pacer wire or catheter noted in right ventricle. - Right atrium: Central venous pressure (est): 3 mm Hg. - Tricuspid valve: There was trivial regurgitation. - Pulmonary arteries: PA peak pressure: 20 mm Hg (S). - Pericardium, extracardiac: There was no pericardial effusion.  Holter 02/12/16 Sinus rhythm 1 degree AV block Minimum HR: 50 BPM at 12:59:11 AM Maximum HR: 118 BPM at  8:00:47 AM Average HR: 74 BPM.  ASSESSMENT AND PLAN:  1.  Complete heart block: Jude dual-chamber pacemaker implanted 09/19/2015.  Device functioning appropriately.  No changes at this time.   2. Hyperlipidemia: Per primary physician  Current medicines are reviewed at length with the patient today.   The patient does not have concerns regarding her medicines.  The following changes were made today:  none  Labs/ tests ordered today include:  No orders of the defined types were placed in this encounter.    Disposition:   FU with Corianne Buccellato 12 months  Signed, Laith Antonelli Meredith Leeds, MD  10/30/2017 11:09 AM     [facility] [address] [place] [address] [phone] (office) [phone] (fax)

## 2017-10-30 NOTE — Patient Instructions (Signed)
Medication Instructions:  Your physician recommends that you continue on your current medications as directed. Please refer to the Current Medication list given to you today.  *If you need a refill on your cardiac medications before your next appointment, please call your pharmacy*  Labwork: None ordered  Testing/Procedures: None ordered  Follow-Up: Remote monitoring is used to monitor your Pacemaker or ICD from home. This monitoring reduces the number of office visits required to check your device to one time per year. It allows [place] to keep an eye on the functioning of your device to ensure it is working properly. You are scheduled for a device check from home on 12/15/2017. You may send your transmission at any time that day. If you have a wireless device, the transmission will be sent automatically. After your physician reviews your transmission, you will receive a postcard with your next transmission date.  Your physician wants you to follow-up in: 1 year with Dr. Curt Bears.  You will receive a reminder letter in the mail two months in advance. If you don't receive a letter, please call our office to schedule the follow-up appointment.  Thank you for choosing [facility]!!   Trinidad Curet, RN [phone]

## 2017-10-31 LAB — CUP PACEART INCLINIC DEVICE CHECK
Brady Statistic RA Percent Paced: 0.19 %
Brady Statistic RV Percent Paced: 99.91 %
Implantable Lead Implant Date: 20170418
Implantable Lead Location: 753859
Implantable Pulse Generator Implant Date: 20170418
Lead Channel Impedance Value: 487.5 Ohm
Lead Channel Impedance Value: 512.5 Ohm
Lead Channel Pacing Threshold Amplitude: 1 V
Lead Channel Pacing Threshold Pulse Width: 0.5 ms
Lead Channel Sensing Intrinsic Amplitude: 5 mV
Lead Channel Sensing Intrinsic Amplitude: 5.6 mV
Lead Channel Setting Pacing Amplitude: 2 V
Lead Channel Setting Pacing Amplitude: 2.5 V
Lead Channel Setting Pacing Pulse Width: 0.5 ms
Lead Channel Setting Sensing Sensitivity: 2 mV
MDC IDC LEAD IMPLANT DT: 20170418
MDC IDC LEAD LOCATION: 753860
MDC IDC MSMT BATTERY REMAINING LONGEVITY: 109 mo
MDC IDC MSMT BATTERY VOLTAGE: 2.99 V
MDC IDC MSMT LEADCHNL RA PACING THRESHOLD AMPLITUDE: 0.5 V
MDC IDC MSMT LEADCHNL RA PACING THRESHOLD AMPLITUDE: 0.5 V
MDC IDC MSMT LEADCHNL RA PACING THRESHOLD PULSEWIDTH: 0.5 ms
MDC IDC MSMT LEADCHNL RA PACING THRESHOLD PULSEWIDTH: 0.5 ms
MDC IDC MSMT LEADCHNL RV PACING THRESHOLD AMPLITUDE: 1 V
MDC IDC MSMT LEADCHNL RV PACING THRESHOLD PULSEWIDTH: 0.5 ms
MDC IDC SESS DTM: 20190530150900
Pulse Gen Model: 2272
Pulse Gen Serial Number: 7882213

## 2017-12-15 ENCOUNTER — Ambulatory Visit (INDEPENDENT_AMBULATORY_CARE_PROVIDER_SITE_OTHER): Payer: Medicare Other | Admitting: *Deleted

## 2017-12-15 DIAGNOSIS — I442 Atrioventricular block, complete: Secondary | ICD-10-CM

## 2017-12-16 LAB — CUP PACEART REMOTE DEVICE CHECK
Brady Statistic AP VP Percent: 1 %
Brady Statistic AP VS Percent: 1 %
Brady Statistic AS VS Percent: 1 %
Brady Statistic RA Percent Paced: 1 %
Brady Statistic RV Percent Paced: 99 %
Date Time Interrogation Session: 20190715060014
Implantable Lead Implant Date: 20170418
Implantable Lead Implant Date: 20170418
Implantable Lead Location: 753860
Lead Channel Impedance Value: 490 Ohm
Lead Channel Pacing Threshold Amplitude: 1 V
Lead Channel Pacing Threshold Pulse Width: 0.5 ms
Lead Channel Sensing Intrinsic Amplitude: 5 mV
Lead Channel Setting Pacing Amplitude: 2 V
Lead Channel Setting Pacing Amplitude: 2.5 V
Lead Channel Setting Pacing Pulse Width: 0.5 ms
Lead Channel Setting Sensing Sensitivity: 2 mV
MDC IDC LEAD LOCATION: 753859
MDC IDC MSMT BATTERY REMAINING LONGEVITY: 113 mo
MDC IDC MSMT BATTERY REMAINING PERCENTAGE: 95.5 %
MDC IDC MSMT BATTERY VOLTAGE: 2.99 V
MDC IDC MSMT LEADCHNL RA IMPEDANCE VALUE: 560 Ohm
MDC IDC MSMT LEADCHNL RA PACING THRESHOLD AMPLITUDE: 0.5 V
MDC IDC MSMT LEADCHNL RA PACING THRESHOLD PULSEWIDTH: 0.5 ms
MDC IDC MSMT LEADCHNL RV SENSING INTR AMPL: 5.6 mV
MDC IDC PG IMPLANT DT: 20170418
MDC IDC PG SERIAL: 7882213
MDC IDC STAT BRADY AS VP PERCENT: 99 %

## 2017-12-16 NOTE — Progress Notes (Signed)
Remote pacemaker transmission.   

## 2017-12-17 ENCOUNTER — Encounter: Payer: Self-pay | Admitting: Cardiology

## 2017-12-25 ENCOUNTER — Other Ambulatory Visit: Payer: Self-pay | Admitting: Family Medicine

## 2018-03-16 ENCOUNTER — Telehealth: Payer: Self-pay | Admitting: Cardiology

## 2018-03-16 ENCOUNTER — Encounter: Payer: Medicare Other | Admitting: *Deleted

## 2018-03-16 NOTE — Telephone Encounter (Signed)
LMOVM reminding pt to send remote transmission.   

## 2018-03-23 ENCOUNTER — Other Ambulatory Visit: Payer: Self-pay | Admitting: *Deleted

## 2018-03-24 MED ORDER — TOPIRAMATE 100 MG PO TABS: 100.0000 mg | ORAL_TABLET | Freq: Two times a day (BID) | ORAL | 1 refills | Status: DC

## 2018-03-24 NOTE — Progress Notes (Signed)
Prescription sent electronically to pharmacy. 

## 2018-03-24 NOTE — Progress Notes (Signed)
May have 6 refills 

## 2018-04-01 ENCOUNTER — Encounter: Payer: Self-pay | Admitting: Family Medicine

## 2018-04-01 ENCOUNTER — Other Ambulatory Visit: Payer: Self-pay | Admitting: Family Medicine

## 2018-04-01 ENCOUNTER — Ambulatory Visit (INDEPENDENT_AMBULATORY_CARE_PROVIDER_SITE_OTHER): Payer: Medicare Other | Admitting: Family Medicine

## 2018-04-01 VITALS — BP 112/70 | Ht 64.5 in | Wt 172.2 lb

## 2018-04-01 DIAGNOSIS — K2100 Gastro-esophageal reflux disease with esophagitis, without bleeding: Secondary | ICD-10-CM

## 2018-04-01 DIAGNOSIS — G43709 Chronic migraine without aura, not intractable, without status migrainosus: Secondary | ICD-10-CM

## 2018-04-01 DIAGNOSIS — M810 Age-related osteoporosis without current pathological fracture: Secondary | ICD-10-CM

## 2018-04-01 DIAGNOSIS — R5383 Other fatigue: Secondary | ICD-10-CM

## 2018-04-01 DIAGNOSIS — K581 Irritable bowel syndrome with constipation: Secondary | ICD-10-CM

## 2018-04-01 DIAGNOSIS — M2550 Pain in unspecified joint: Secondary | ICD-10-CM | POA: Diagnosis not present

## 2018-04-01 DIAGNOSIS — Z23 Encounter for immunization: Secondary | ICD-10-CM

## 2018-04-01 DIAGNOSIS — G4700 Insomnia, unspecified: Secondary | ICD-10-CM

## 2018-04-01 MED ORDER — PANTOPRAZOLE SODIUM 40 MG PO TBEC: 40.0000 mg | DELAYED_RELEASE_TABLET | Freq: Every day | ORAL | 5 refills | Status: DC

## 2018-04-01 NOTE — Progress Notes (Signed)
   Subjective:    Patient ID: [name], female    DOB: 01/18/1951, 67 y.o.   MRN: [id number]  HPIMed check up.   Lightheaded. started today. States her grandchildren have all been sick.  Patient denies double vision nausea vomiting  Heartburn most of the time after eating tomatoe based products and chocolate. Causes her to cough and cough last for hours. Could  Not afford nexium. Taking otc prilosec.  She describes a lot of burning in her upper neck she also describes burning in her throat  Continued bilateral leg and arm pain. Pt thinks its from mass cell disease.  She states that if he gets worse she will follow-up with her specialist  Daily headaches. Takes maxalt if its really bad. Pt thinks headaches are from allergies.  Patient relates more of a throbbing headache but occasional migraines.     Review of Systems  Constitutional: Negative for activity change, appetite change and fatigue.  HENT: Negative for congestion and rhinorrhea.   Respiratory: Negative for cough and shortness of breath.   Cardiovascular: Negative for chest pain and leg swelling.  Gastrointestinal: Negative for abdominal pain and diarrhea.  Endocrine: Negative for polydipsia and polyphagia.  Skin: Negative for color change.  Neurological: Negative for dizziness and weakness.  Psychiatric/Behavioral: Negative for behavioral problems and confusion.       Objective:   Physical Exam  Constitutional: She appears well-nourished. No distress.  HENT:  Head: Normocephalic and atraumatic.  Eyes: Right eye exhibits no discharge. Left eye exhibits no discharge.  Neck: No tracheal deviation present.  Cardiovascular: Normal rate, regular rhythm and normal heart sounds.  No murmur heard. Pulmonary/Chest: Effort normal and breath sounds normal. No respiratory distress.  Musculoskeletal: She exhibits no edema.  Lymphadenopathy:    She has no cervical adenopathy.  Neurological: She is alert. Coordination  normal.  Skin: Skin is warm and dry.  Psychiatric: She has a normal mood and affect. Her behavior is normal.  Vitals reviewed.         Assessment & Plan:  1. Need for vaccination Shots today - Pneumococcal conjugate vaccine 13-valent IM - Flu Vaccine QUAD 6+ mos PF IM (Fluarix Quad PF)  2. Other fatigue Significant fatigue issues probably related to stress  3. Arthralgia, unspecified joint Arthralgias related to her underlying mast cell condition  4. Chronic migraine without aura without status migrainosus, not intractable Frequent headaches stress and underlying migraine issues  5. Irritable bowel syndrome with constipation Dicyclomine as needed  6. Gastroesophageal reflux disease with esophagitis Try Protonix Take a break from Fosamax for the next 6 weeks give [place] update in 4 to 6 weeks  7. Osteoporosis, unspecified osteoporosis type, unspecified pathological fracture presence Get this done once her new insurance kicks in  8. Insomnia, unspecified type Melatonin as needed  25 minutes was spent with the patient.  This statement verifies that 25 minutes was indeed spent with the patient.  More than 50% of this visit-total duration of the visit-was spent in counseling and coordination of care. The issues that the patient came in for today as reflected in the diagnosis (s) please refer to documentation for further details.

## 2018-04-04 ENCOUNTER — Other Ambulatory Visit: Payer: Self-pay | Admitting: Family Medicine

## 2018-04-04 DIAGNOSIS — J029 Acute pharyngitis, unspecified: Secondary | ICD-10-CM | POA: Diagnosis not present

## 2018-05-30 ENCOUNTER — Other Ambulatory Visit: Payer: Self-pay | Admitting: Family Medicine

## 2018-06-06 ENCOUNTER — Other Ambulatory Visit: Payer: Self-pay | Admitting: Family Medicine

## 2018-06-08 IMAGING — CT CT BIOPSY
1 of 2 series · 13 of 16 positions shown, 17 images · non-contrast
Comparison: none

INDICATION: 65-year-old female with a history of mastocytosis

[Series 2: i-spiral 5.0 b40f · axial · 0.59mm/px · z∈[+1206,+1297]mm · 13 of 32 slices shown, 17 images]
[im 3/32  soft-tissue]
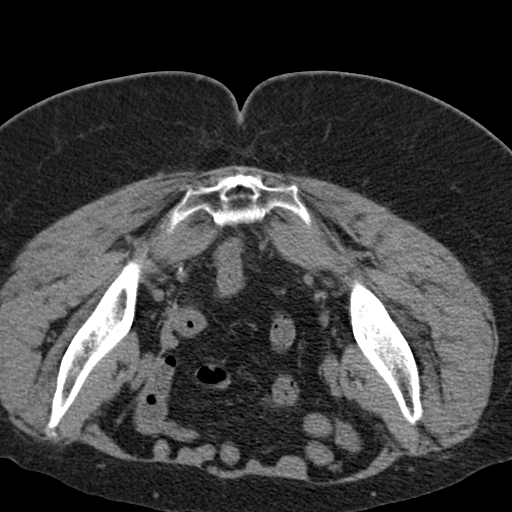
[im 3/32  bone]
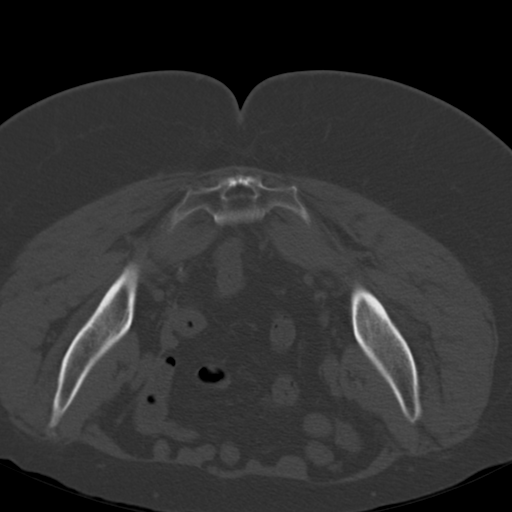
[im 5/32  soft-tissue]
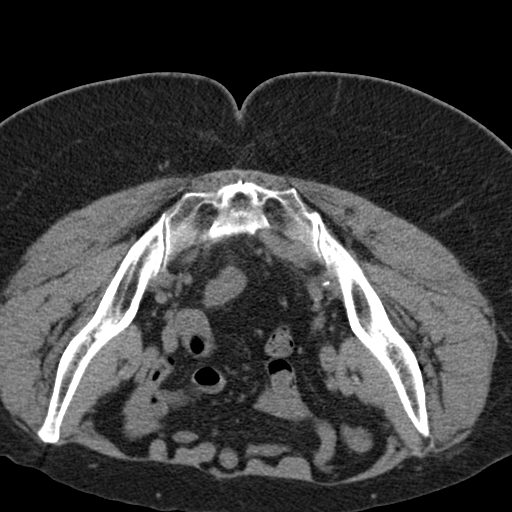
[im 7/32  soft-tissue]
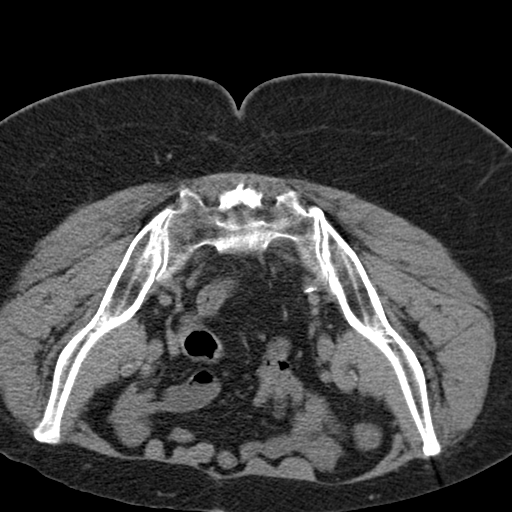
[im 9/32  soft-tissue]
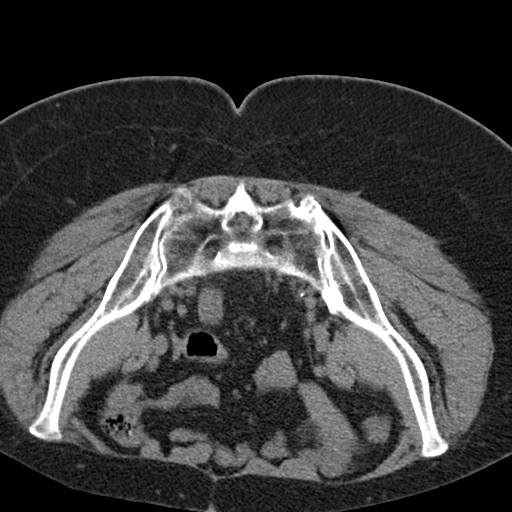
[im 12/32  soft-tissue]
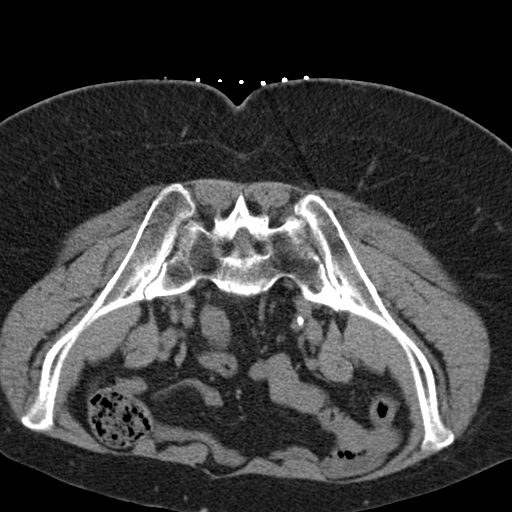
[im 12/32  bone]
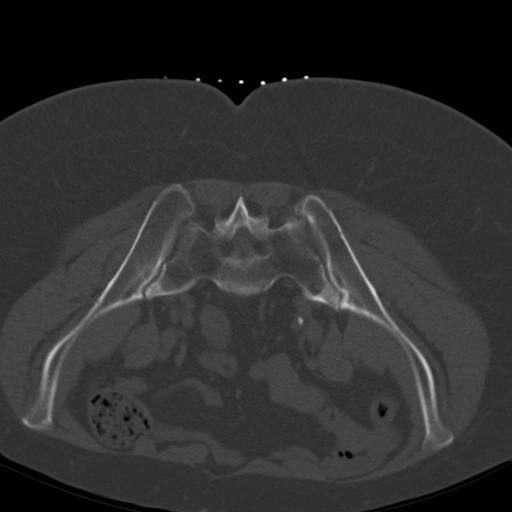
[im 14/32  soft-tissue]
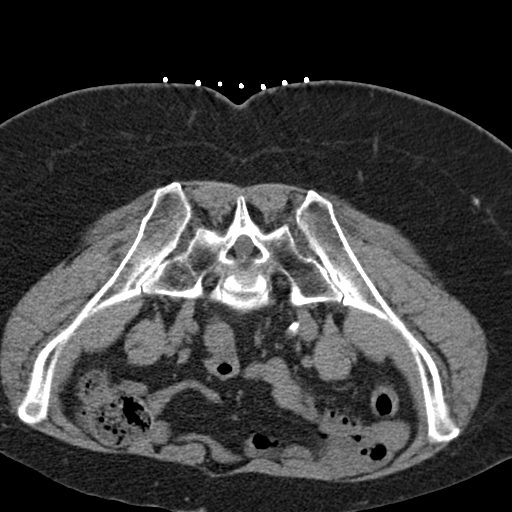
[im 16/32  soft-tissue]
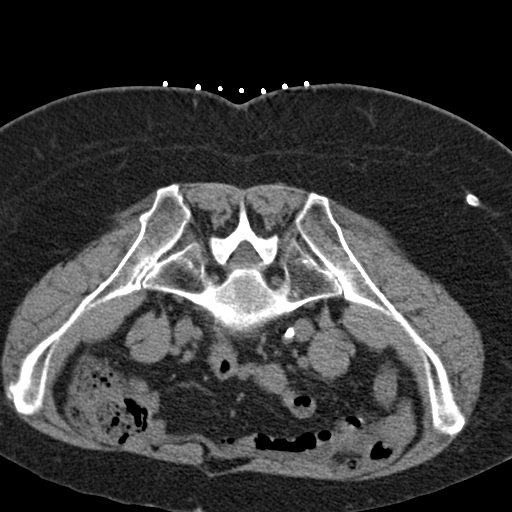
[im 18/32  soft-tissue]
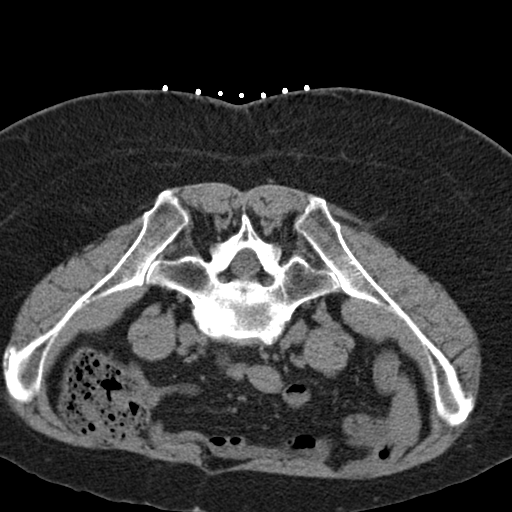
[im 20/32  soft-tissue]
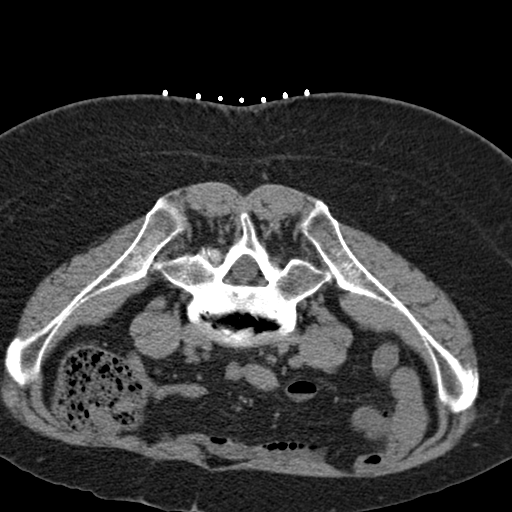
[im 20/32  bone]
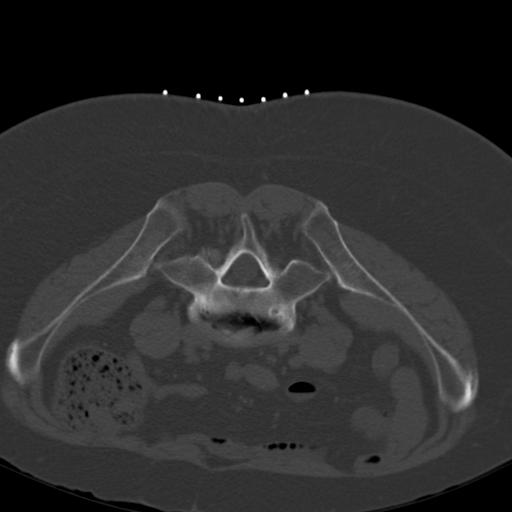
[im 23/32  soft-tissue]
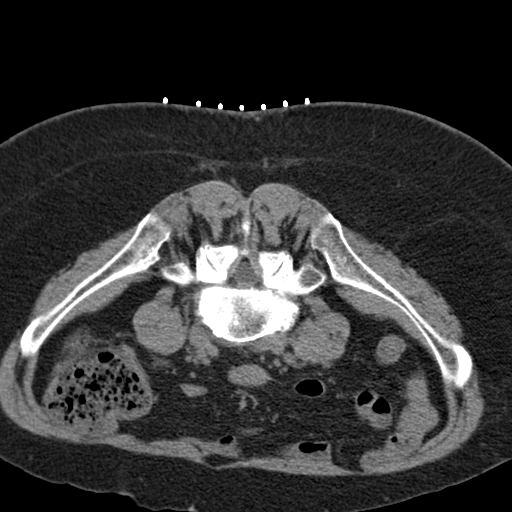
[im 25/32  soft-tissue]
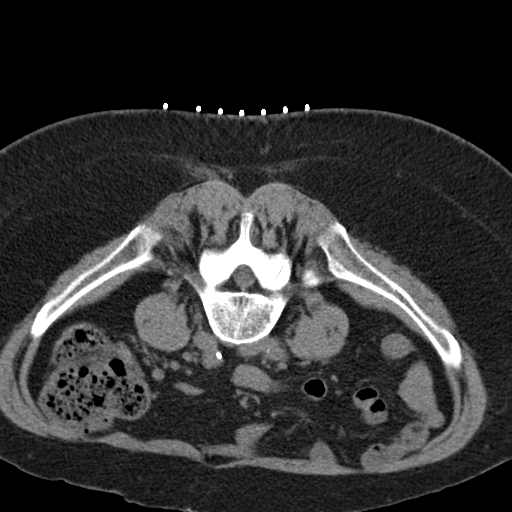
[im 27/32  soft-tissue]
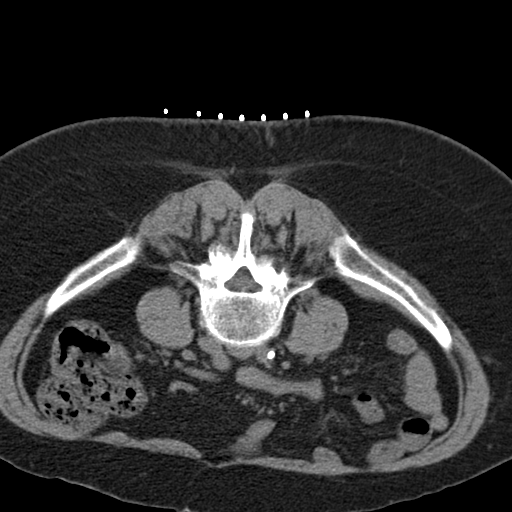
[im 29/32  soft-tissue]
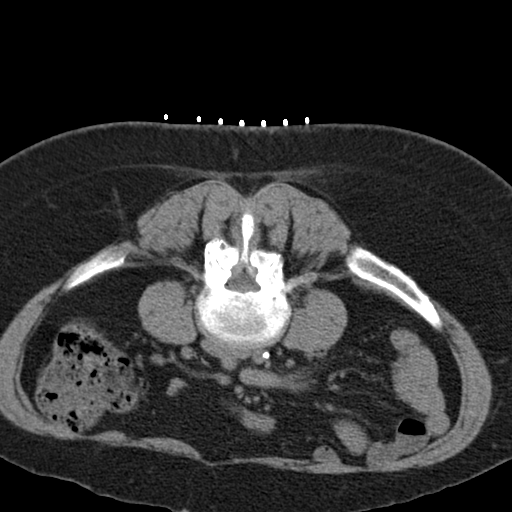
[im 29/32  bone]
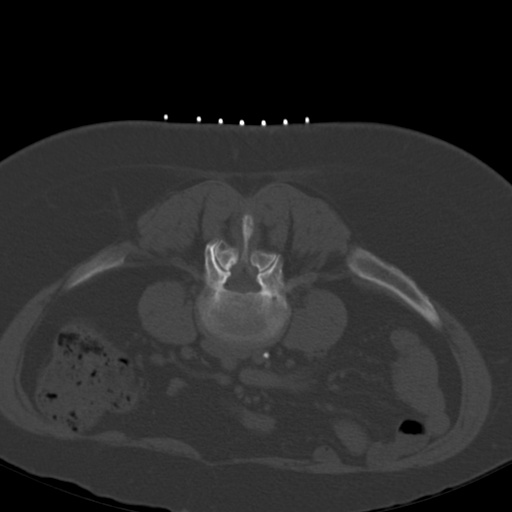

[13 of 16 positions shown; findings below may reference images not displayed]

EXAM:
CT-GUIDED BONE MARROW BIOPSY

MEDICATIONS:
None.

ANESTHESIA/SEDATION:
Moderate (conscious) sedation was employed during this procedure. A
total of Versed 2.0 mg and Fentanyl 100 mcg was administered
intravenously.

Moderate Sedation Time: 13 minutes. The patient's level of
consciousness and vital signs were monitored continuously by
radiology nursing throughout the procedure under my direct
supervision.

FLUOROSCOPY TIME:  CT

COMPLICATIONS:
None

PROCEDURE:
The procedure risks, benefits, and alternatives were explained to
the patient. Questions regarding the procedure were encouraged and
answered. The patient understands and consents to the procedure.

Scout CT of the pelvis was performed for surgical planning purposes.

The posterior pelvis was prepped with Betadinein a sterile fashion,
and a sterile drape was applied covering the operative field. A
sterile gown and sterile gloves were used for the procedure. Local
anesthesia was provided with 1% Lidocaine.

We targeted the right posterior iliac bone for biopsy. The skin and
subcutaneous tissues were infiltrated with 1% lidocaine without
epinephrine. A small stab incision was made with an 11 blade
scalpel, and an 11 gauge Mehdi needle was advanced with CT guidance
to the posterior cortex. Manual forced was used to advance the
needle through the posterior cortex and the stylet was removed. A
bone marrow aspirate was retrieved and passed to a cytotechnologist
in the room. A second aspirate for the purposes of her specific
diagnosis was performed. The Mehdi needle was then advanced without
the stylet for a core biopsy. The core biopsy was retrieved and also
passed to a cytotechnologist.

Manual pressure was used for hemostasis and a sterile dressing was
placed.

No complications were encountered no significant blood loss was
encountered.

Patient tolerated the procedure well and remained hemodynamically
stable throughout.
IMPRESSION: Status post CT-guided bone marrow biopsy, with tissue specimen sent
to pathology for complete histopathologic analysis

## 2018-06-11 ENCOUNTER — Encounter: Payer: Self-pay | Admitting: Family Medicine

## 2018-06-11 ENCOUNTER — Ambulatory Visit (INDEPENDENT_AMBULATORY_CARE_PROVIDER_SITE_OTHER): Payer: Medicare Other | Admitting: Family Medicine

## 2018-06-11 VITALS — BP 136/88 | Temp 97.9°F | Wt 171.2 lb

## 2018-06-11 DIAGNOSIS — J329 Chronic sinusitis, unspecified: Secondary | ICD-10-CM | POA: Diagnosis not present

## 2018-06-11 DIAGNOSIS — J4520 Mild intermittent asthma, uncomplicated: Secondary | ICD-10-CM

## 2018-06-11 MED ORDER — ALBUTEROL SULFATE HFA 108 (90 BASE) MCG/ACT IN AERS: 2.0000 | INHALATION_SPRAY | Freq: Four times a day (QID) | RESPIRATORY_TRACT | 2 refills | Status: DC | PRN

## 2018-06-11 MED ORDER — AMOXICILLIN-POT CLAVULANATE 875-125 MG PO TABS: 1.0000 | ORAL_TABLET | Freq: Two times a day (BID) | ORAL | 0 refills | Status: AC

## 2018-06-11 NOTE — Progress Notes (Signed)
   Subjective:    Patient ID: [name], female    DOB: 06/13/1950, 68 y.o.   MRN: [id number]  Cough  This is a new problem. The current episode started more than 1 month ago (going on since Thanksgiving). The cough is productive of sputum. Associated symptoms include headaches, postnasal drip, a sore throat and shortness of breath. Associated symptoms comments: Short of breath when coughing. . Treatments tried: Mucinex D. The treatment provided no relief.   Pt went to Urgent Care on April 28, 2018  At urgicar had severe sore throat and swelling, was woorried about  Allergy  Both ears were painful   Baby had the croup, bot of g kids were sick   They gave t lidocaine   rsv within the family last few weeks ago tow g kids had rsv    no sig expo  Non smoker  No sig exposure    Mostly in head   Feels stuck in chest   Coughing a lot  Bad coughing and choking   No asthma since less than 70 yrs old   Has d bad   Using mucinex d, now taking twelve hr tabs  Took anothr roung    Cough gets some up but not a lot   Now has had more symoms  Went to the e r  Review of Systems  HENT: Positive for postnasal drip and sore throat.   Respiratory: Positive for cough and shortness of breath.   Neurological: Positive for headaches.       Objective:   Physical Exam Alert active moderate nasal congestion pharynx normal lungs occasional wheezy cough noted.  No wheezes on auscultation heart regular rhythm.       Assessment & Plan:  Impression subacute rhinosinusitis/bronchitis with element of reactive airways.  Recent RSV exposure with grandkids may also be contributing.  Discussed.  Strongly encouraged to use albuterol 2 sprays 4 times daily.  Augmentin prescribed twice daily.  Symptom care discussed.  If this turns into long-term may need to return for reactive airway/asthma work-up.  Patient did have reactive airways/asthma disease as a child

## 2018-06-19 NOTE — Telephone Encounter (Signed)
May have this +3 refills 

## 2018-07-17 ENCOUNTER — Ambulatory Visit (INDEPENDENT_AMBULATORY_CARE_PROVIDER_SITE_OTHER): Payer: Medicare Other | Admitting: Family Medicine

## 2018-07-17 VITALS — BP 110/74 | Ht 64.5 in | Wt 170.6 lb

## 2018-07-17 DIAGNOSIS — R42 Dizziness and giddiness: Secondary | ICD-10-CM

## 2018-07-17 DIAGNOSIS — Z13220 Encounter for screening for lipoid disorders: Secondary | ICD-10-CM | POA: Diagnosis not present

## 2018-07-17 DIAGNOSIS — M810 Age-related osteoporosis without current pathological fracture: Secondary | ICD-10-CM

## 2018-07-17 MED ORDER — ZOSTER VAC RECOMB ADJUVANTED 50 MCG/0.5ML IM SUSR: 0.5000 mL | Freq: Once | INTRAMUSCULAR | 1 refills | Status: AC

## 2018-07-17 MED ORDER — TOPIRAMATE 100 MG PO TABS: 100.0000 mg | ORAL_TABLET | Freq: Two times a day (BID) | ORAL | 1 refills | Status: DC

## 2018-07-17 MED ORDER — LUBIPROSTONE 24 MCG PO CAPS: 24.0000 ug | ORAL_CAPSULE | Freq: Two times a day (BID) | ORAL | 5 refills | Status: DC

## 2018-07-17 NOTE — Progress Notes (Signed)
   Subjective:    Patient ID: [name], female    DOB: [birth year], 68 y.o.   MRN: [id number]  HPI  Patient states she has had off and on dizziness for a month. Patient had dizziness yest- rooms spinning- had a migraine that was relieved with Maxalt. Patient states she has had on going congestion and cough for month. Intermittent dizziness intermittent headaches yesterday felt unsteady denied unilateral numbness weakness shortness of breath denied any severe headache today no double vision blurred vision blood pressure elevated yesterday not elevated today  Patient with osteoporosis did not tolerate Fosamax caused significant heartburn will need alternative Review of Systems  Constitutional: Negative for activity change, appetite change and fatigue.  HENT: Negative for congestion and rhinorrhea.   Respiratory: Negative for cough and shortness of breath.   Cardiovascular: Negative for chest pain and leg swelling.  Gastrointestinal: Negative for abdominal pain and diarrhea.  Endocrine: Negative for polydipsia and polyphagia.  Skin: Negative for color change.  Neurological: Positive for dizziness. Negative for weakness.  Psychiatric/Behavioral: Negative for behavioral problems and confusion.       Objective:   Physical Exam Vitals signs reviewed.  Constitutional:      General: She is not in acute distress. HENT:     Head: Normocephalic and atraumatic.  Eyes:     General:        Right eye: No discharge.        Left eye: No discharge.  Neck:     Trachea: No tracheal deviation.  Cardiovascular:     Rate and Rhythm: Normal rate and regular rhythm.     Heart sounds: Normal heart sounds. No murmur.  Pulmonary:     Effort: Pulmonary effort is normal. No respiratory distress.     Breath sounds: Normal breath sounds.  Lymphadenopathy:     Cervical: No cervical adenopathy.  Skin:    General: Skin is warm and dry.  Neurological:     Mental Status: She is alert.   Coordination: Coordination normal.  Psychiatric:        Behavior: Behavior normal.    Finger-to-nose normal Romberg normal heart exam normal blood pressure very good       Assessment & Plan:  No sign of stroke Dizziness probably related to inner ear or migraine If reoccurring issues follow-up Healthy eating regular physical activity Check cholesterol Patient is preferring injectable for osteoporosis because she did not tolerate Fosamax because it gave her heartburn Follow-up within 6 months sooner if problems

## 2018-07-17 NOTE — Patient Instructions (Addendum)
DASH Eating Plan DASH stands for "Dietary Approaches to Stop Hypertension." The DASH eating plan is a healthy eating plan that has been shown to reduce high blood pressure (hypertension). It may also reduce your risk for type 2 diabetes, heart disease, and stroke. The DASH eating plan may also help with weight loss. What are tips for following this plan?  General guidelines  Avoid eating more than 2,300 mg (milligrams) of salt (sodium) a day. If you have hypertension, you may need to reduce your sodium intake to 1,500 mg a day.  Limit alcohol intake to no more than 1 drink a day for nonpregnant women and 2 drinks a day for men. One drink equals 12 oz of beer, 5 oz of wine, or 1 oz of hard liquor.  Work with your health care provider to maintain a healthy body weight or to lose weight. Ask what an ideal weight is for you.  Get at least 30 minutes of exercise that causes your heart to beat faster (aerobic exercise) most days of the week. Activities may include walking, swimming, or biking.  Work with your health care provider or diet and nutrition specialist (dietitian) to adjust your eating plan to your individual calorie needs. Reading food labels   Check food labels for the amount of sodium per serving. Choose foods with less than 5 percent of the Daily Value of sodium. Generally, foods with less than 300 mg of sodium per serving fit into this eating plan.  To find whole grains, look for the word "whole" as the first word in the ingredient list. Shopping  Buy products labeled as "low-sodium" or "no salt added."  Buy fresh foods. Avoid canned foods and premade or frozen meals. Cooking  Avoid adding salt when cooking. Use salt-free seasonings or herbs instead of table salt or sea salt. Check with your health care provider or pharmacist before using salt substitutes.  Do not fry foods. Cook foods using healthy methods such as baking, boiling, grilling, and broiling instead.  Cook with  heart-healthy oils, such as olive, canola, soybean, or sunflower oil. Meal planning  Eat a balanced diet that includes: ? 5 or more servings of fruits and vegetables each day. At each meal, try to fill half of your plate with fruits and vegetables. ? Up to 6-8 servings of whole grains each day. ? Less than 6 oz of lean meat, poultry, or fish each day. A 3-oz serving of meat is about the same size as a deck of cards. One egg equals 1 oz. ? 2 servings of low-fat dairy each day. ? A serving of nuts, seeds, or beans 5 times each week. ? Heart-healthy fats. Healthy fats called Omega-3 fatty acids are found in foods such as flaxseeds and coldwater fish, like sardines, salmon, and mackerel.  Limit how much you eat of the following: ? Canned or prepackaged foods. ? Food that is high in trans fat, such as fried foods. ? Food that is high in saturated fat, such as fatty meat. ? Sweets, desserts, sugary drinks, and other foods with added sugar. ? Full-fat dairy products.  Do not salt foods before eating.  Try to eat at least 2 vegetarian meals each week.  Eat more home-cooked food and less restaurant, buffet, and fast food.  When eating at a restaurant, ask that your food be prepared with less salt or no salt, if possible. What foods are recommended? The items listed may not be a complete list. Talk with your dietitian about   what dietary choices are best for you. Grains Whole-grain or whole-wheat bread. Whole-grain or whole-wheat pasta. Brown rice. Oatmeal. Quinoa. Bulgur. Whole-grain and low-sodium cereals. Pita bread. Low-fat, low-sodium crackers. Whole-wheat flour tortillas. Vegetables Fresh or frozen vegetables (raw, steamed, roasted, or grilled). Low-sodium or reduced-sodium tomato and vegetable juice. Low-sodium or reduced-sodium tomato sauce and tomato paste. Low-sodium or reduced-sodium canned vegetables. Fruits All fresh, dried, or frozen fruit. Canned fruit in natural juice (without  added sugar). Meat and other protein foods Skinless chicken or turkey. Ground chicken or turkey. Pork with fat trimmed off. Fish and seafood. Egg whites. Dried beans, peas, or lentils. Unsalted nuts, nut butters, and seeds. Unsalted canned beans. Lean cuts of beef with fat trimmed off. Low-sodium, lean deli meat. Dairy Low-fat (1%) or fat-free (skim) milk. Fat-free, low-fat, or reduced-fat cheeses. Nonfat, low-sodium ricotta or cottage cheese. Low-fat or nonfat yogurt. Low-fat, low-sodium cheese. Fats and oils Soft margarine without trans fats. Vegetable oil. Low-fat, reduced-fat, or light mayonnaise and salad dressings (reduced-sodium). Canola, safflower, olive, soybean, and sunflower oils. Avocado. Seasoning and other foods Herbs. Spices. Seasoning mixes without salt. Unsalted popcorn and pretzels. Fat-free sweets. What foods are not recommended? The items listed may not be a complete list. Talk with your dietitian about what dietary choices are best for you. Grains Baked goods made with fat, such as croissants, muffins, or some breads. Dry pasta or rice meal packs. Vegetables Creamed or fried vegetables. Vegetables in a cheese sauce. Regular canned vegetables (not low-sodium or reduced-sodium). Regular canned tomato sauce and paste (not low-sodium or reduced-sodium). Regular tomato and vegetable juice (not low-sodium or reduced-sodium). Pickles. Olives. Fruits Canned fruit in a light or heavy syrup. Fried fruit. Fruit in cream or butter sauce. Meat and other protein foods Fatty cuts of meat. Ribs. Fried meat. Bacon. Sausage. Bologna and other processed lunch meats. Salami. Fatback. Hotdogs. Bratwurst. Salted nuts and seeds. Canned beans with added salt. Canned or smoked fish. Whole eggs or egg yolks. Chicken or turkey with skin. Dairy Whole or 2% milk, cream, and half-and-half. Whole or full-fat cream cheese. Whole-fat or sweetened yogurt. Full-fat cheese. Nondairy creamers. Whipped toppings.  Processed cheese and cheese spreads. Fats and oils Butter. Stick margarine. Lard. Shortening. Ghee. Bacon fat. Tropical oils, such as coconut, palm kernel, or palm oil. Seasoning and other foods Salted popcorn and pretzels. Onion salt, garlic salt, seasoned salt, table salt, and sea salt. Worcestershire sauce. Tartar sauce. Barbecue sauce. Teriyaki sauce. Soy sauce, including reduced-sodium. Steak sauce. Canned and packaged gravies. Fish sauce. Oyster sauce. Cocktail sauce. Horseradish that you find on the shelf. Ketchup. Mustard. Meat flavorings and tenderizers. Bouillon cubes. Hot sauce and Tabasco sauce. Premade or packaged marinades. Premade or packaged taco seasonings. Relishes. Regular salad dressings. Where to find more information:  National Heart, Lung, and Blood Institute: www.nhlbi.nih.gov  American Heart Association: www.heart.org Summary  The DASH eating plan is a healthy eating plan that has been shown to reduce high blood pressure (hypertension). It may also reduce your risk for type 2 diabetes, heart disease, and stroke.  With the DASH eating plan, you should limit salt (sodium) intake to 2,300 mg a day. If you have hypertension, you may need to reduce your sodium intake to 1,500 mg a day.  When on the DASH eating plan, aim to eat more fresh fruits and vegetables, whole grains, lean proteins, low-fat dairy, and heart-healthy fats.  Work with your health care provider or diet and nutrition specialist (dietitian) to adjust your eating plan to your   individual calorie needs. This information is not intended to replace advice given to you by your health care provider. Make sure you discuss any questions you have with your health care provider. Document Released: 05/09/2011 Document Revised: 05/13/2016 Document Reviewed: 05/13/2016 Elsevier Interactive Patient Education  2019 Elsevier Inc.  Shingrix and shingles prevention: know the facts!   Shingrix is a very effective vaccine  to prevent shingles.   Shingles is a reactivation of chickenpox -more than 99% of Americans born before 1980 have had chickenpox even if they do not remember it. One in every 10 people who get shingles have severe long-lasting nerve pain as a result.   33 out of a 100 older adults will get shingles if they are unvaccinated.     This vaccine is very important for your health This vaccine is indicated for anyone 50 years or older. You can get this vaccine even if you have already had shingles because you can get the disease more than once in a lifetime.  Your risk for shingles and its complications increases with age.  This vaccine has 2 doses.  The second dose would be 2 to 6 months after the first dose.  If you had Zostavax vaccine in the past you should still get Shingrix. ( Zostavax is only 70% effective and it loses significant strength over a few years .)  This vaccine is given through the pharmacy.  The cost of the vaccine is through your insurance. The pharmacy can inform you of the total costs.  Common side effects including soreness in the arm, some redness and swelling, also some feel fatigue muscle soreness headache low-grade fever.  Side effects typically go away within 2 to 3 days. Remember-the pain from shingles can last a lifetime but these side effects of the vaccine will only last a few days at most. It is very important to get both doses in order to protect yourself fully.   Please get this vaccine at your earliest convenience at your trusted pharmacy.

## 2018-07-18 LAB — LIPID PANEL
Chol/HDL Ratio: 3.4 ratio (ref 0.0–4.4)
Cholesterol, Total: 256 mg/dL — ABNORMAL HIGH (ref 100–199)
HDL: 76 mg/dL (ref >39)
LDL CALC: 159 mg/dL — AB (ref 0–99)
Triglycerides: 103 mg/dL (ref 0–149)
VLDL Cholesterol Cal: 21 mg/dL (ref 5–40)

## 2018-07-28 ENCOUNTER — Telehealth: Payer: Self-pay | Admitting: Family Medicine

## 2018-07-28 NOTE — Telephone Encounter (Signed)
Please complete highlighted area, sign & date order for [facility] so that [facility] can send for scheduling  In red folder in basket on wall

## 2018-07-28 NOTE — Telephone Encounter (Signed)
This was completed thank you 

## 2018-07-28 NOTE — Telephone Encounter (Signed)
Please see

## 2018-07-29 NOTE — Telephone Encounter (Signed)
Order was sent via email to [facility] short stay, they'll contact pt to schedule

## 2018-08-25 ENCOUNTER — Encounter: Payer: Self-pay | Admitting: Gastroenterology

## 2018-09-14 ENCOUNTER — Ambulatory Visit (INDEPENDENT_AMBULATORY_CARE_PROVIDER_SITE_OTHER): Payer: Medicare Other | Admitting: *Deleted

## 2018-09-14 ENCOUNTER — Other Ambulatory Visit: Payer: Self-pay

## 2018-09-14 ENCOUNTER — Other Ambulatory Visit: Payer: Self-pay | Admitting: Family Medicine

## 2018-09-14 DIAGNOSIS — I442 Atrioventricular block, complete: Secondary | ICD-10-CM | POA: Diagnosis not present

## 2018-09-14 LAB — CUP PACEART REMOTE DEVICE CHECK
Date Time Interrogation Session: 20200413212138
Implantable Lead Implant Date: 20170418
Implantable Lead Implant Date: 20170418
Implantable Lead Location: 753859
Implantable Lead Location: 753860
Implantable Pulse Generator Implant Date: 20170418
Pulse Gen Model: 2272
Pulse Gen Serial Number: 7882213

## 2018-09-23 ENCOUNTER — Encounter: Payer: Self-pay | Admitting: Gastroenterology

## 2018-09-25 NOTE — Progress Notes (Signed)
Remote pacemaker transmission.   

## 2018-09-28 ENCOUNTER — Other Ambulatory Visit: Payer: Self-pay | Admitting: Family Medicine

## 2018-10-02 ENCOUNTER — Inpatient Hospital Stay (HOSPITAL_COMMUNITY): Admission: RE | Admit: 2018-10-02 | Payer: Medicare Other | Source: Ambulatory Visit

## 2018-11-02 ENCOUNTER — Encounter (HOSPITAL_COMMUNITY): Payer: Self-pay | Admitting: *Deleted

## 2018-11-23 ENCOUNTER — Other Ambulatory Visit: Payer: Self-pay | Admitting: Family Medicine

## 2018-12-14 ENCOUNTER — Ambulatory Visit (INDEPENDENT_AMBULATORY_CARE_PROVIDER_SITE_OTHER): Payer: Medicare Other | Admitting: *Deleted

## 2018-12-14 DIAGNOSIS — I442 Atrioventricular block, complete: Secondary | ICD-10-CM | POA: Diagnosis not present

## 2018-12-14 LAB — CUP PACEART REMOTE DEVICE CHECK
Battery Remaining Longevity: 113 mo
Battery Remaining Percentage: 95.5 %
Battery Voltage: 2.99 V
Brady Statistic AP VP Percent: 1 %
Brady Statistic AP VS Percent: 1 %
Brady Statistic AS VP Percent: 99 %
Brady Statistic AS VS Percent: 1 %
Brady Statistic RA Percent Paced: 1 %
Brady Statistic RV Percent Paced: 99 %
Date Time Interrogation Session: 20200713060020
Implantable Lead Implant Date: 20170418
Implantable Lead Implant Date: 20170418
Implantable Lead Location: 753859
Implantable Lead Location: 753860
Implantable Pulse Generator Implant Date: 20170418
Lead Channel Impedance Value: 490 Ohm
Lead Channel Impedance Value: 490 Ohm
Lead Channel Pacing Threshold Amplitude: 0.5 V
Lead Channel Pacing Threshold Amplitude: 1 V
Lead Channel Pacing Threshold Pulse Width: 0.5 ms
Lead Channel Pacing Threshold Pulse Width: 0.5 ms
Lead Channel Sensing Intrinsic Amplitude: 5 mV
Lead Channel Sensing Intrinsic Amplitude: 6.3 mV
Lead Channel Setting Pacing Amplitude: 2 V
Lead Channel Setting Pacing Amplitude: 2.5 V
Lead Channel Setting Pacing Pulse Width: 0.5 ms
Lead Channel Setting Sensing Sensitivity: 2 mV
Pulse Gen Model: 2272
Pulse Gen Serial Number: 7882213

## 2018-12-28 NOTE — Progress Notes (Signed)
Remote pacemaker transmission.   

## 2018-12-30 ENCOUNTER — Ambulatory Visit (INDEPENDENT_AMBULATORY_CARE_PROVIDER_SITE_OTHER): Payer: Medicare Other | Admitting: Gastroenterology

## 2018-12-30 ENCOUNTER — Other Ambulatory Visit: Payer: Self-pay

## 2018-12-30 ENCOUNTER — Encounter: Payer: Self-pay | Admitting: Gastroenterology

## 2018-12-30 DIAGNOSIS — K581 Irritable bowel syndrome with constipation: Secondary | ICD-10-CM | POA: Diagnosis not present

## 2018-12-30 DIAGNOSIS — D126 Benign neoplasm of colon, unspecified: Secondary | ICD-10-CM | POA: Insufficient documentation

## 2018-12-30 NOTE — Progress Notes (Signed)
Subjective:    Patient ID: [name], female    DOB: July 10, 1950, 68 y.o.   MRN: [id number] Kathyrn Drown, MD   HPI Using DULCOLAX STOL SOFTENER WITH LAXATIVE 1-2 EVERY NIGHT AND SHE KEEPS GOING. TRIED INCREASING FIBER AND SHE GETS MORE CONSTIPATED. [place]. BMs: MAY MOVE A LOT . ONE USUALLY WORKS BUT MAY GO 3-5 DAYS AND NO BM AND WILL INCREASE FIBER AND TAKES FOREVER TO  GET BACK ON TRACT. NORMAL FOR TWO MOS THEN IT GETS OUT OF WHACK AND THEN BACK ON FOR TWO MOS. DURING FLARES: FATIGUED AND TWISTING PAIN AND FEELS THE MOVEMENT AND STRETCHES OUT IT HURTS SO BAD AND GETS HER TO UNTWIST. TROUBLE WITH CONSTIPATION SINCE SHE WAS A CHILD. LOTS OF FLATULENCE.  PT DENIES FEVER, CHILLS, HEMATOCHEZIA, HEMATEMESIS, nausea, vomiting, melena, CHEST PAIN, SHORTNESS OF BREATH,  CHANGE IN BOWEL IN HABITS, problems swallowing, OR heartburn or indigestion.  Past Medical History:  Diagnosis Date  . Acid reflux   . Allergy   . Anxiety   . Complete heart block ([place]) 09/2015   a. s/p St. Jude PPM 09/2015.  Marland Kitchen Dyspnea   . Former tobacco use   . Hyperlipidemia   . Irritable bowel syndrome (IBS) 02/27/2017  . Mast cell disease   . Migraines    Chronic  . PAT (paroxysmal atrial tachycardia) ([place])   . PONV (postoperative nausea and vomiting)   . Presence of permanent cardiac pacemaker 09/19/2015  . Sinusitis   . Sleep apnea   . Urticaria    Past Surgical History:  Procedure Laterality Date  . APPENDECTOMY    . AUGMENTATION MAMMAPLASTY Bilateral   . BIOPSY  06/17/2017   Procedure: BIOPSY;  Surgeon: Danie Binder, MD;  Location: [facility];  Service: Endoscopy;;  colon  . BREAST ENHANCEMENT SURGERY  1985  . CHOLECYSTECTOMY    . COLONOSCOPY    . COLONOSCOPY WITH PROPOFOL N/A 06/17/2017   Dr. Oneida Alar: 30 mm polyp removed from the ascending colon piecemeal, tattooed, tubular adenoma.  8 mm polyp in the ascending colon, four 3 to 5 mm polyps removed from the  rectum and transverse colon.  Tubular adenomas.  External and internal hemorrhoids.  Redundant colon.  Random colon biopsies negative.  Next colonoscopy in 3 years.  . EP IMPLANTABLE DEVICE N/A 09/19/2015     . ESOPHAGOGASTRODUODENOSCOPY (EGD) WITH PROPOFOL N/A 06/17/2017     . INSERT / REPLACE / REMOVE PACEMAKER    . POLYPECTOMY  06/17/2017     . TONSILLECTOMY AND ADENOIDECTOMY    . TUBAL LIGATION     Allergies  Allergen Reactions  . Beta Adrenergic Blockers Shortness Of Breath  . Ambien [Zolpidem Tartrate] Other (See Comments)    Hallucinations  . Biaxin [Clarithromycin] Hives    May have caused hives see 06/19/15  . Fosamax [Alendronate Sodium]     gastritis    Current Outpatient Medications  Medication Sig    . albuterol (PROVENTIL HFA;VENTOLIN HFA) 108 (90 Base) MCG/ACT inhaler Inhale 2 puffs into the lungs every 6 (six) hours as needed for wheezing or shortness of breath.    . Biotin 5000 MCG CAPS Take 5,000 mcg by mouth daily.    . bisacodyl (DULCOLAX) 5 MG EC tablet Take 10 mg by mouth at bedtime.    . Carboxymethylcellulose Sodium (THERATEARS OP) Place 5 drops into both eyes daily.    . fexofenadine (ALLEGRA) 30 MG tablet Take 30 mg by mouth  daily.     . gabapentin (NEURONTIN) 100 MG capsule TAKE 1 CAPSULE BY MOUTH THREE TIMES A DAY    . Multiple Vitamin (MULTIVITAMIN WITH MINERALS) TABS tablet Take 1 tablet by mouth daily.    Marland Kitchen omeprazole (PRILOSEC) 20 MG capsule Take 20 mg by mouth 2 (two) times daily before a meal.    . OVER THE COUNTER MEDICATION B12 1000 mcg one daily    . rizatriptan (MAXALT) 10 MG tablet TAKE BY MOUTH AS DIRECTED    . sertraline (ZOLOFT) 50 MG tablet TAKE 1 TABLET (50 MG TOTAL) DAILY BY MOUTH.    . topiramate (TOPAMAX) 100 MG tablet Take 1 tablet (100 mg total) by mouth 2 (two) times daily.    . vitamin C (ASCORBIC ACID) 500 MG tablet Take 500 mg by mouth daily.     .      .      .      .       Review of Systems PER HPI OTHERWISE ALL SYSTEMS ARE  NEGATIVE.    Objective:   Physical Exam Vitals signs reviewed.  Constitutional:      General: She is not in acute distress.    Appearance: She is well-developed.  HENT:     Head: Normocephalic and atraumatic.     Mouth/Throat:     Comments: MASK IN PLACE Eyes:     General: No scleral icterus.    Pupils: Pupils are equal, round, and reactive to light.  Neck:     Musculoskeletal: Normal range of motion and neck supple.  Cardiovascular:     Rate and Rhythm: Normal rate and regular rhythm.     Heart sounds: Normal heart sounds.  Pulmonary:     Effort: Pulmonary effort is normal. No respiratory distress.     Breath sounds: Normal breath sounds.  Abdominal:     General: Bowel sounds are normal. There is no distension.     Palpations: Abdomen is soft.     Tenderness: There is no abdominal tenderness.  Musculoskeletal: Normal range of motion.     Right lower leg: No edema.     Left lower leg: No edema.  Lymphadenopathy:     Cervical: No cervical adenopathy.  Neurological:     Mental Status: She is alert and oriented to person, place, and time.     Comments: NO  NEW FOCAL DEFICITS  Psychiatric:        Mood and Affect: Mood normal.       Assessment & Plan:

## 2018-12-30 NOTE — Assessment & Plan Note (Signed)
CONSTIPATION PREDOMINANT. SYMPTOMS NOT IDEALLY CONTROLLED.  DISCUSSED HOW DIET AFFECTS PHYSICAL AND MENTAL WELL BEING. DRINK WATER TO KEEP YOUR URINE LIGHT YELLOW.  Do not drink SODA, ENERGY DRINKS OR DIET SODA. AVOID HIGH FRUCTOSE CORN SYRUP. DO NOT chew SUGAR FREE GUM, OR USE ARTIFICIAL SWEETENERS. USE STEVIA IF NEEDED AS A SWEETENER.  TO BOOST ENERGY:   1. ADD VITAMIN B 12 DROPS AND VITAMIN D3 2000 IU DAILY.   2. FOLLOW RECOMMENDATIONS of DR. MARK HYMAN, "10-DAY DETOX DIET".  MEATS SHOULD BE BAKED, BROILED, OR BOILED. Avoid fried foods. DO NOT EAT FAST FOOD.  CONTINUE DULCOLAX TO REDUCE CONSTIPATION.  FOLLOW UP IN 6 MOS.

## 2018-12-30 NOTE — Patient Instructions (Addendum)
DRINK WATER TO KEEP YOUR URINE LIGHT YELLOW.  Do not drink SODA, ENERGY DRINKS OR DIET SODA. AVOID HIGH FRUCTOSE CORN SYRUP. DO NOT chew SUGAR FREE GUM, OR USE ARTIFICIAL SWEETENERS. USE STEVIA IF NEEDED AS A SWEETENER.  TO BOOST ENERGY:    1. ADD VITAMIN B 12 DROPS AND VITAMIN D3 2000 IU DAILY.    2. FOLLOW RECOMMENDATIONS of DR. MARK HYMAN, "10-DAY DETOX DIET".  MEATS SHOULD BE BAKED, BROILED, OR BOILED. Avoid fried foods. DO NOT EAT FAST FOOD.    3. PRACTICE CHAIR YOGA FOR 15-30 MINS 3 OR 4 TIMES A WEEK.   CONTINUE DULCOLAX TO REDUCE CONSTIPATION.  FOLLOW UP IN 6 MOS.

## 2018-12-30 NOTE — Assessment & Plan Note (Signed)
NO WARNING SIGNS/SYMPTOMS. LAST TCS JAN 2019.  DISCUSSED WITH PT NEXT TCS JAN 2022 BUT SHE CAN CALL FOR AN INTERVAL TCS IF SHE DEVELOPS, ANEMIA OR BRBPR. DISCUSSED COLONOSCOPY CAN miss polyps < 1 cm 10-20% of the time.

## 2019-01-10 ENCOUNTER — Other Ambulatory Visit: Payer: Self-pay | Admitting: Family Medicine

## 2019-01-11 NOTE — Telephone Encounter (Signed)
May have 90-day refill follow-up office visit this fall

## 2019-01-12 ENCOUNTER — Other Ambulatory Visit: Payer: Self-pay

## 2019-01-12 ENCOUNTER — Encounter: Payer: Self-pay | Admitting: Cardiology

## 2019-01-12 ENCOUNTER — Encounter (HOSPITAL_COMMUNITY)
Admission: RE | Admit: 2019-01-12 | Discharge: 2019-01-12 | Disposition: A | Discharge status: Home / Self Care | Payer: Medicare Other | Source: Ambulatory Visit | Attending: Gastroenterology | Admitting: Gastroenterology

## 2019-01-12 ENCOUNTER — Encounter (HOSPITAL_COMMUNITY): Payer: Self-pay

## 2019-01-12 ENCOUNTER — Ambulatory Visit (INDEPENDENT_AMBULATORY_CARE_PROVIDER_SITE_OTHER): Payer: Medicare Other | Admitting: Cardiology

## 2019-01-12 VITALS — BP 96/62 | HR 60 | Ht 64.5 in | Wt 170.0 lb

## 2019-01-12 DIAGNOSIS — I442 Atrioventricular block, complete: Secondary | ICD-10-CM

## 2019-01-12 DIAGNOSIS — M810 Age-related osteoporosis without current pathological fracture: Secondary | ICD-10-CM | POA: Diagnosis not present

## 2019-01-12 LAB — CUP PACEART INCLINIC DEVICE CHECK
Battery Remaining Longevity: 127 mo
Battery Voltage: 2.99 V
Brady Statistic RA Percent Paced: 0.14 %
Brady Statistic RV Percent Paced: 99.96 %
Date Time Interrogation Session: 20200811165631
Implantable Lead Implant Date: 20170418
Implantable Lead Implant Date: 20170418
Implantable Lead Location: 753859
Implantable Lead Location: 753860
Implantable Pulse Generator Implant Date: 20170418
Lead Channel Impedance Value: 487.5 Ohm
Lead Channel Impedance Value: 512.5 Ohm
Lead Channel Pacing Threshold Amplitude: 0.5 V
Lead Channel Pacing Threshold Amplitude: 0.875 V
Lead Channel Pacing Threshold Pulse Width: 0.5 ms
Lead Channel Pacing Threshold Pulse Width: 0.5 ms
Lead Channel Sensing Intrinsic Amplitude: 5 mV
Lead Channel Sensing Intrinsic Amplitude: 6.3 mV
Lead Channel Setting Pacing Amplitude: 1.125
Lead Channel Setting Pacing Amplitude: 2 V
Lead Channel Setting Pacing Pulse Width: 0.5 ms
Lead Channel Setting Sensing Sensitivity: 2 mV
Pulse Gen Model: 2272
Pulse Gen Serial Number: 7882213

## 2019-01-12 LAB — BASIC METABOLIC PANEL
Anion gap: 7 (ref 5–15)
BUN: 19 mg/dL (ref 8–23)
CO2: 27 mmol/L (ref 22–32)
Calcium: 9 mg/dL (ref 8.9–10.3)
Chloride: 106 mmol/L (ref 98–111)
Creatinine, Ser: 0.87 mg/dL (ref 0.44–1.00)
GFR calc Af Amer: >60 mL/min (ref >60)
GFR calc non Af Amer: >60 mL/min (ref >60)
Glucose, Bld: 90 mg/dL (ref 70–99)
Potassium: 4.2 mmol/L (ref 3.5–5.1)
Sodium: 140 mmol/L (ref 135–145)

## 2019-01-12 MED ORDER — ZOLEDRONIC ACID 5 MG/100ML IV SOLN
5.0000 mg | Freq: Once | INTRAVENOUS | Status: AC
  Administered 2019-01-12: 5 mg via INTRAVENOUS

## 2019-01-12 MED ORDER — SODIUM CHLORIDE 0.9 % IV SOLN
Freq: Once | INTRAVENOUS | Status: AC
  Administered 2019-01-12: 10:00:00 via INTRAVENOUS

## 2019-01-12 NOTE — Discharge Instructions (Signed)

## 2019-01-12 NOTE — Progress Notes (Signed)
Electrophysiology Office Note   Date:  01/12/2019   ID:  [name], DOB 09/02/1950, MRN [id number]  PCP:  Kathyrn Drown, MD  Primary Electrophysiologist:  Constance Haw, MD    No chief complaint on file.    History of Present Illness: [name] is a 68 y.o. female who presents today for electrophysiology evaluation.   Presented to the hospital after being found in complete heart block.  St. Jude dual chamber pacemaker placed 09/19/15.   Today, denies symptoms of palpitations, chest pain, shortness of breath, orthopnea, PND, lower extremity edema, claudication, dizziness, presyncope, syncope, bleeding, or neurologic sequela. The patient is tolerating medications without difficulties.  Fortunately she was diagnosed with a progressive neuropathy.  Despite that, she continues to work 4 days a week.  She is continuing to be active and exercising.   Past Medical History:  Diagnosis Date  . Acid reflux   . Allergy   . Anxiety   . Complete heart block ([place]) 09/2015   a. s/p St. Jude PPM 09/2015.  Marland Kitchen Dyspnea   . Former tobacco use   . Hyperlipidemia   . Irritable bowel syndrome (IBS) 02/27/2017  . Mast cell disease   . Migraines    Chronic  . PAT (paroxysmal atrial tachycardia) ([place])   . PONV (postoperative nausea and vomiting)   . Presence of permanent cardiac pacemaker 09/19/2015  . Sinusitis   . Sleep apnea   . Urticaria    Past Surgical History:  Procedure Laterality Date  . APPENDECTOMY    . AUGMENTATION MAMMAPLASTY Bilateral   . BIOPSY  06/17/2017   Procedure: BIOPSY;  Surgeon: Danie Binder, MD;  [facility];  Service: Endoscopy;;  colon  . BREAST ENHANCEMENT SURGERY  1985  . CHOLECYSTECTOMY    . COLONOSCOPY    . COLONOSCOPY WITH PROPOFOL N/A 06/17/2017   Dr. Oneida Alar: 30 mm polyp removed from the ascending colon piecemeal, tattooed, tubular adenoma.  8 mm polyp in the ascending colon, four 3 to 5 mm polyps removed from the rectum and  transverse colon.  Tubular adenomas.  External and internal hemorrhoids.  Redundant colon.  Random colon biopsies negative.  Next colonoscopy in 3 years.  . EP IMPLANTABLE DEVICE N/A 09/19/2015   Procedure: Pacemaker Implant;  Surgeon: Will Meredith Leeds, MD;  Location: [facility];  Service: Cardiovascular;  Laterality: N/A;  . ESOPHAGOGASTRODUODENOSCOPY (EGD) WITH PROPOFOL N/A 06/17/2017   Dr. Oneida Alar: Medium sized hiatal hernia, mild erosive gastritis, small bowel biopsies negative for celiac.  Gastric biopsies with chronic mild inactive gastritis but no H. pylori.  Randolm Idol / REPLACE / REMOVE PACEMAKER    . POLYPECTOMY  06/17/2017   Procedure: POLYPECTOMY;  Surgeon: Danie Binder, MD;  Location: [facility];  Service: Endoscopy;;  colon  . TONSILLECTOMY AND ADENOIDECTOMY    . TUBAL LIGATION       Current Outpatient Medications  Medication Sig Dispense Refill  . albuterol (PROVENTIL HFA;VENTOLIN HFA) 108 (90 Base) MCG/ACT inhaler Inhale 2 puffs into the lungs every 6 (six) hours as needed for wheezing or shortness of breath. 1 Inhaler 2  . Biotin 5000 MCG CAPS Take 5,000 mcg by mouth daily.    . bisacodyl (DULCOLAX) 5 MG EC tablet Take 10 mg by mouth at bedtime.    . Carboxymethylcellulose Sodium (THERATEARS OP) Place 5 drops into both eyes daily.    . fexofenadine (ALLEGRA) 30 MG tablet Take 30 mg by mouth daily.     Marland Kitchen  gabapentin (NEURONTIN) 100 MG capsule TAKE 1 CAPSULE BY MOUTH THREE TIMES A DAY 270 capsule 2  . Multiple Vitamin (MULTIVITAMIN WITH MINERALS) TABS tablet Take 1 tablet by mouth daily.    Marland Kitchen OVER THE COUNTER MEDICATION B12 1000 mcg one daily    . rizatriptan (MAXALT) 10 MG tablet TAKE BY MOUTH AS DIRECTED 18 tablet 3  . sertraline (ZOLOFT) 50 MG tablet TAKE 1 TABLET (50 MG TOTAL) DAILY BY MOUTH. 90 tablet 1  . topiramate (TOPAMAX) 100 MG tablet TAKE 1 TABLET BY MOUTH TWICE A DAY 180 tablet 0  . vitamin C (ASCORBIC ACID) 500 MG tablet Take 500 mg by mouth daily.      . clonazePAM (KLONOPIN) 0.5 MG tablet TAKE 1 TABLET BY MOUTH TWICE A DAY AS NEEDED FOR ANXIETY (Patient not taking: Reported on 12/30/2018) 40 tablet 3  . lubiprostone (AMITIZA) 24 MCG capsule Take 1 capsule (24 mcg total) by mouth 2 (two) times daily with a meal. (Patient not taking: Reported on 12/30/2018) 60 capsule 5  . NASALCROM 5.2 MG/ACT nasal spray PLACE 1 SPRAY INTO BOTH NOSTRILS 4 (FOUR) TIMES DAILY. (Patient not taking: Reported on 12/30/2018) 26 mL 3  . omeprazole (PRILOSEC) 20 MG capsule Take 20 mg by mouth 2 (two) times daily before a meal.    . pantoprazole (PROTONIX) 40 MG tablet Take 1 tablet (40 mg total) by mouth daily. (Patient not taking: Reported on 12/30/2018) 30 tablet 5   No current facility-administered medications for this visit.     Allergies:   Beta adrenergic blockers, Ambien [zolpidem tartrate], Biaxin [clarithromycin], and Fosamax [alendronate sodium]   Social History:  The patient  reports that she quit smoking about 14 years ago. Her smoking use included cigarettes. She has a 30.00 pack-year smoking history. She has never used smokeless tobacco. She reports that she does not drink alcohol or use drugs.   Family History:  The patient's family history includes Allergic rhinitis in her maternal grandmother; Asthma in her maternal grandmother and paternal grandmother; Cancer in her maternal grandfather; Congestive Heart Failure in her mother; Dementia in her mother; Diabetes in her maternal grandmother, mother, paternal grandmother, and sister; Heart attack in her paternal grandmother; Heart disease in her paternal grandmother; Lung cancer in her father; Parkinson's disease in her mother.    ROS:  Please see the history of present illness.   Otherwise, review of systems is positive for none.   All other systems are reviewed and negative.   PHYSICAL EXAM: VS:  BP 96/62   Pulse 60   Ht 5' 4.5" (1.638 m)   Wt 170 lb (77.1 kg)   BMI 28.73 kg/m  , BMI Body mass index is  28.73 kg/m. GEN: Well nourished, well developed, in no acute distress  HEENT: normal  Neck: no JVD, carotid bruits, or masses Cardiac: RRR; no murmurs, rubs, or gallops,no edema  Respiratory:  clear to auscultation bilaterally, normal work of breathing GI: soft, nontender, nondistended, + BS MS: no deformity or atrophy  Skin: warm and dry, device site well healed Neuro:  Strength and sensation are intact Psych: euthymic mood, full affect  EKG:  EKG is ordered today. Personal review of the ekg ordered shows atrial sensed, ventricular paced  Personal review of the device interrogation today. Results in [facility]: 01/12/2019: BUN 19; Creatinine, Ser 0.87; Potassium 4.2; Sodium 140    Lipid Panel     Component Value Date/Time   CHOL 256 (H) 07/17/2018 1229  TRIG 103 07/17/2018 1229   HDL 76 07/17/2018 1229   CHOLHDL 3.4 07/17/2018 1229   CHOLHDL 2.4 01/19/2014 0918   VLDL 13 01/19/2014 0918   LDLCALC 159 (H) 07/17/2018 1229     Wt Readings from Last 3 Encounters:  01/12/19 170 lb (77.1 kg)  01/12/19 170 lb (77.1 kg)  12/30/18 173 lb 9.6 oz (78.7 kg)      Other studies Reviewed: Additional studies/ records that were reviewed today include: TTE 01/23/16 - Left ventricle: The cavity size was normal. Wall thickness was   normal. Systolic function was normal. The estimated ejection   fraction was in the range of 55% to 60%. Wall motion was normal;   there were no regional wall motion abnormalities. Doppler   parameters are consistent with abnormal left ventricular   relaxation (grade 1 diastolic dysfunction). - Aortic valve: Trileaflet; mildly calcified leaflets. - Right ventricle: Pacer wire or catheter noted in right ventricle. - Right atrium: Central venous pressure (est): 3 mm Hg. - Tricuspid valve: There was trivial regurgitation. - Pulmonary arteries: PA peak pressure: 20 mm Hg (S). - Pericardium, extracardiac: There was no pericardial effusion.   Holter 02/12/16 Sinus rhythm 1 degree AV block Minimum HR: 50 BPM at 12:59:11 AM Maximum HR: 118 BPM at 8:00:47 AM Average HR: 74 BPM.  ASSESSMENT AND PLAN:  1.  Complete heart block: Status post Saint Jude dual-chamber pacemaker implanted 09/19/2018.  Device functioning appropriately.  No changes.  2. Hyperlipidemia: Per primary physician  Current medicines are reviewed at length with the patient today.   The patient does not have concerns regarding her medicines.  The following changes were made today:  none  Labs/ tests ordered today include:  Orders Placed This Encounter  Procedures  . EKG 12-Lead     Disposition:   FU with Will Camnitz 12 months  Signed, Will Meredith Leeds, MD  01/12/2019 2:45 PM     [facility] [address] [place] [address] [phone] (office) [phone] (fax)

## 2019-01-12 NOTE — Patient Instructions (Addendum)
Medication Instructions:  Your physician recommends that you continue on your current medications as directed. Please refer to the Current Medication list given to you today.  *If you need a refill on your cardiac medications before your next appointment, please call your pharmacy*  Labwork: None ordered  Testing/Procedures: None ordered  Follow-Up: Remote monitoring is used to monitor your Pacemaker or ICD from home. This monitoring reduces the number of office visits required to check your device to one time per year. It allows [place] to keep an eye on the functioning of your device to ensure it is working properly. You are scheduled for a device check from home on 03/16/19. You may send your transmission at any time that day. If you have a wireless device, the transmission will be sent automatically. After your physician reviews your transmission, you will receive a postcard with your next transmission date.  Your physician wants you to follow-up in: 1 year with Dr. Curt Bears.  You will receive a reminder letter in the mail two months in advance. If you don't receive a letter, please call our office to schedule the follow-up appointment.  Thank you for choosing [facility]!!   Trinidad Curet, RN [phone]

## 2019-03-16 ENCOUNTER — Ambulatory Visit (INDEPENDENT_AMBULATORY_CARE_PROVIDER_SITE_OTHER): Payer: Medicare Other | Admitting: *Deleted

## 2019-03-16 DIAGNOSIS — I442 Atrioventricular block, complete: Secondary | ICD-10-CM | POA: Diagnosis not present

## 2019-03-16 DIAGNOSIS — I471 Supraventricular tachycardia: Secondary | ICD-10-CM

## 2019-03-16 LAB — CUP PACEART REMOTE DEVICE CHECK
Battery Remaining Longevity: 126 mo
Battery Remaining Percentage: 95.5 %
Battery Voltage: 2.99 V
Brady Statistic AP VP Percent: 1 %
Brady Statistic AP VS Percent: 1 %
Brady Statistic AS VP Percent: 99 %
Brady Statistic AS VS Percent: 1 %
Brady Statistic RA Percent Paced: 1 %
Brady Statistic RV Percent Paced: 99 %
Date Time Interrogation Session: 20201012070402
Implantable Lead Implant Date: 20170418
Implantable Lead Implant Date: 20170418
Implantable Lead Location: 753859
Implantable Lead Location: 753860
Implantable Pulse Generator Implant Date: 20170418
Lead Channel Impedance Value: 480 Ohm
Lead Channel Impedance Value: 490 Ohm
Lead Channel Pacing Threshold Amplitude: 0.5 V
Lead Channel Pacing Threshold Amplitude: 1.125 V
Lead Channel Pacing Threshold Pulse Width: 0.5 ms
Lead Channel Pacing Threshold Pulse Width: 0.5 ms
Lead Channel Sensing Intrinsic Amplitude: 12 mV
Lead Channel Sensing Intrinsic Amplitude: 5 mV
Lead Channel Setting Pacing Amplitude: 1.375
Lead Channel Setting Pacing Amplitude: 2 V
Lead Channel Setting Pacing Pulse Width: 0.5 ms
Lead Channel Setting Sensing Sensitivity: 2 mV
Pulse Gen Model: 2272
Pulse Gen Serial Number: 7882213

## 2019-03-23 ENCOUNTER — Other Ambulatory Visit: Payer: Self-pay | Admitting: Family Medicine

## 2019-03-23 DIAGNOSIS — Z23 Encounter for immunization: Secondary | ICD-10-CM | POA: Diagnosis not present

## 2019-03-26 NOTE — Progress Notes (Signed)
Remote pacemaker transmission.   

## 2019-04-10 ENCOUNTER — Other Ambulatory Visit: Payer: Self-pay | Admitting: Family Medicine

## 2019-06-12 DIAGNOSIS — Z23 Encounter for immunization: Secondary | ICD-10-CM | POA: Diagnosis not present

## 2019-06-15 ENCOUNTER — Ambulatory Visit (INDEPENDENT_AMBULATORY_CARE_PROVIDER_SITE_OTHER): Payer: Medicare Other | Admitting: *Deleted

## 2019-06-15 DIAGNOSIS — I442 Atrioventricular block, complete: Secondary | ICD-10-CM

## 2019-06-15 LAB — CUP PACEART REMOTE DEVICE CHECK
Battery Remaining Longevity: 126 mo
Battery Remaining Percentage: 95.5 %
Battery Voltage: 2.99 V
Brady Statistic AP VP Percent: 1 %
Brady Statistic AP VS Percent: 1 %
Brady Statistic AS VP Percent: 99 %
Brady Statistic AS VS Percent: 1 %
Brady Statistic RA Percent Paced: 1 %
Brady Statistic RV Percent Paced: 99 %
Date Time Interrogation Session: 20210111020014
Implantable Lead Implant Date: 20170418
Implantable Lead Implant Date: 20170418
Implantable Lead Location: 753859
Implantable Lead Location: 753860
Implantable Pulse Generator Implant Date: 20170418
Lead Channel Impedance Value: 480 Ohm
Lead Channel Impedance Value: 510 Ohm
Lead Channel Pacing Threshold Amplitude: 0.5 V
Lead Channel Pacing Threshold Amplitude: 1 V
Lead Channel Pacing Threshold Pulse Width: 0.5 ms
Lead Channel Pacing Threshold Pulse Width: 0.5 ms
Lead Channel Sensing Intrinsic Amplitude: 11.2 mV
Lead Channel Sensing Intrinsic Amplitude: 5 mV
Lead Channel Setting Pacing Amplitude: 1.25 V
Lead Channel Setting Pacing Amplitude: 2 V
Lead Channel Setting Pacing Pulse Width: 0.5 ms
Lead Channel Setting Sensing Sensitivity: 2 mV
Pulse Gen Model: 2272
Pulse Gen Serial Number: 7882213

## 2019-07-13 ENCOUNTER — Other Ambulatory Visit: Payer: Self-pay | Admitting: Family Medicine

## 2019-07-13 DIAGNOSIS — Z23 Encounter for immunization: Secondary | ICD-10-CM | POA: Diagnosis not present

## 2019-07-13 NOTE — Telephone Encounter (Signed)
Please contact pt to set up appt; then may route back to nurses. Thank you

## 2019-07-13 NOTE — Telephone Encounter (Signed)
Pt has CPE scheduled with Hoyle Sauer 4/16. She would like refill to last until that appt please.   Also would like to know if lab work is needed for her CPE.

## 2019-09-02 DIAGNOSIS — H0288A Meibomian gland dysfunction right eye, upper and lower eyelids: Secondary | ICD-10-CM | POA: Diagnosis not present

## 2019-09-02 DIAGNOSIS — H2513 Age-related nuclear cataract, bilateral: Secondary | ICD-10-CM | POA: Diagnosis not present

## 2019-09-02 DIAGNOSIS — H0288B Meibomian gland dysfunction left eye, upper and lower eyelids: Secondary | ICD-10-CM | POA: Diagnosis not present

## 2019-09-02 DIAGNOSIS — H524 Presbyopia: Secondary | ICD-10-CM | POA: Diagnosis not present

## 2019-09-14 ENCOUNTER — Ambulatory Visit (INDEPENDENT_AMBULATORY_CARE_PROVIDER_SITE_OTHER): Payer: Medicare Other | Admitting: *Deleted

## 2019-09-14 DIAGNOSIS — I442 Atrioventricular block, complete: Secondary | ICD-10-CM | POA: Diagnosis not present

## 2019-09-14 LAB — CUP PACEART REMOTE DEVICE CHECK
Battery Remaining Longevity: 127 mo
Battery Remaining Percentage: 95.5 %
Battery Voltage: 2.99 V
Brady Statistic AP VP Percent: 1 %
Brady Statistic AP VS Percent: 1 %
Brady Statistic AS VP Percent: 99 %
Brady Statistic AS VS Percent: 1 %
Brady Statistic RA Percent Paced: 1 %
Brady Statistic RV Percent Paced: 99 %
Date Time Interrogation Session: 20210413020013
Implantable Lead Implant Date: 20170418
Implantable Lead Implant Date: 20170418
Implantable Lead Location: 753859
Implantable Lead Location: 753860
Implantable Pulse Generator Implant Date: 20170418
Lead Channel Impedance Value: 450 Ohm
Lead Channel Impedance Value: 510 Ohm
Lead Channel Pacing Threshold Amplitude: 0.5 V
Lead Channel Pacing Threshold Amplitude: 1.125 V
Lead Channel Pacing Threshold Pulse Width: 0.5 ms
Lead Channel Pacing Threshold Pulse Width: 0.5 ms
Lead Channel Sensing Intrinsic Amplitude: 11.2 mV
Lead Channel Sensing Intrinsic Amplitude: 5 mV
Lead Channel Setting Pacing Amplitude: 1.375
Lead Channel Setting Pacing Amplitude: 2 V
Lead Channel Setting Pacing Pulse Width: 0.5 ms
Lead Channel Setting Sensing Sensitivity: 2 mV
Pulse Gen Model: 2272
Pulse Gen Serial Number: 7882213

## 2019-09-15 NOTE — Progress Notes (Signed)
PPM Remote  

## 2019-09-17 ENCOUNTER — Encounter: Payer: Self-pay | Admitting: Nurse Practitioner

## 2019-09-17 ENCOUNTER — Other Ambulatory Visit: Payer: Self-pay

## 2019-09-17 ENCOUNTER — Telehealth (INDEPENDENT_AMBULATORY_CARE_PROVIDER_SITE_OTHER): Payer: Medicare Other | Admitting: Nurse Practitioner

## 2019-09-17 ENCOUNTER — Telehealth: Payer: Self-pay | Admitting: *Deleted

## 2019-09-17 DIAGNOSIS — F418 Other specified anxiety disorders: Secondary | ICD-10-CM

## 2019-09-17 DIAGNOSIS — G43709 Chronic migraine without aura, not intractable, without status migrainosus: Secondary | ICD-10-CM | POA: Diagnosis not present

## 2019-09-17 NOTE — Patient Instructions (Signed)
Continue your Zoloft for depression. Start taking 1 1/2 tablet daily and let us know how you do with this.  I will send a list of Migraine medications to your Mychart. Some of the new meds are IM injection. There are also some PRN (as needed) medications that are new as well. I will send both.  Continue to watch your diet as much as you can. Low salt, low carb, heart healthy.   Schedule your Womens wellness exam and Pap. We will order Lab work so on your next appt I can go over those with you. Also schedule your Mammogram.   Continue your routine visits with your cardiologist and eye Dr's.   Will follow up after lab work is done and for your women's well visit in the next few months.

## 2019-09-17 NOTE — Telephone Encounter (Signed)
[name], widmark are scheduled for a virtual visit with your provider today.    Just as we do with appointments in the office, we must obtain your consent to participate.  Your consent will be active for this visit and any virtual visit you may have with one of our providers in the next 365 days.    If you have a MyChart account, I can also send a copy of this consent to you electronically.  All virtual visits are billed to your insurance company just like a traditional visit in the office.  As this is a virtual visit, video technology does not allow for your provider to perform a traditional examination.  This may limit your provider's ability to fully assess your condition.  If your provider identifies any concerns that need to be evaluated in person or the need to arrange testing such as labs, EKG, etc, we will make arrangements to do so.    Although advances in technology are sophisticated, we cannot ensure that it will always work on either your end or our end.  If the connection with a video visit is poor, we may have to switch to a telephone visit.  With either a video or telephone visit, we are not always able to ensure that we have a secure connection.   I need to obtain your verbal consent now.   Are you willing to proceed with your visit today?   [name] has provided verbal consent on 09/17/2019 for a virtual visit (video or telephone).   Mitzie Na, RN 09/17/2019  9:47 AM

## 2019-09-17 NOTE — Progress Notes (Signed)
  VIDEO VISIT Subjective:    Patient ID: [name], female    DOB: 05/21/1951, 69 y.o.   MRN: [id number]  HPI presents today via telehealth for migraine headaches that are currently weekly. She is on Maxalt and Topamax. Has had a increase in weight gain due to overeating and decrease in exercise. She has lots of stressors in her life including taking care of her granddaughter with stage 5 kidney failure, husband, and great grandchildren. We decided to increase her Zoloft to 1 1/2 tablet a day. Due for her routine woman's wellness exam which is she schedule along with her mammogram. Will go ahead an order her lab work to review at her next visit.  No change in her migraine symptomatology. Even though they are once a week, states they are much better than they used to be. Usually relieved with Maxalt.    Review of Systems  Eyes: Negative for pain, itching and visual disturbance.       Has had some vision changes in the past but nothing new, Right eye worse than the left. Left eye throbs at time which is being monitored by her Eye doctor and all test were negative. No lesions or erythema   Respiratory: Negative for cough, chest tightness and shortness of breath.   Cardiovascular: Negative for chest pain, palpitations and leg swelling.       Pacemaker, followed by cardiology  Neurological: Positive for headaches. Negative for dizziness, tremors, seizures, syncope, facial asymmetry, speech difficulty, weakness and light-headedness.       Migraine headaches weekly  Psychiatric/Behavioral: Negative for self-injury and suicidal ideas. The patient is not nervous/anxious.        Increased stress        Objective:   Physical Exam Neurological:     Mental Status: She is alert.     Cranial Nerves: No facial asymmetry.  Psychiatric:        Attention and Perception: Attention and perception normal.        Speech: Speech normal.        Behavior: Behavior normal.        Thought Content: Thought  content normal.        Cognition and Memory: Cognition normal.        Judgment: Judgment normal.   Format  Patient present at home Provider present at office Consent for interaction obtained Coronavirus outbreak made virtual visit necessary          Assessment & Plan:   Problem List Items Addressed This Visit      Cardiovascular and Mediastinum   Migraines - Primary     Other   Depression with anxiety      Education: Continue your Zoloft for depression. Start taking 1 1/2 tablet daily. Go back to 50 mg dose and contact office if any adverse effects.   Will send a list of newer Migraine medications to your Mychart. Some of the new meds are IM injection. Let us know which medication your insurance covers.   Continue to watch your diet as much as you can. Low salt, low carb, heart healthy.   Schedule your Womens wellness exam and Pap. We will order Lab work so on your next appt. Patient plans to schedule her own mammogram.   Continue your routine visits with your cardiologist and optometrist.   Recheck in one month regarding new Zoloft dose.

## 2019-09-17 NOTE — Progress Notes (Signed)
   Subjective:    Patient ID: [name], female    DOB: 20-Feb-1951, 69 y.o.   MRN: [id number]  Headache   Depression        Associated symptoms include headaches.  Depression screen [facility] 2/9 09/17/2019 02/27/2017 08/19/2016  Decreased Interest 0 1 0  Down, Depressed, Hopeless 1 1 -  PHQ - 2 Score 1 2 0  Altered sleeping 1 2 -  Tired, decreased energy 1 2 -  Change in appetite 1 3 -  Feeling bad or failure about yourself  1 0 -  Trouble concentrating 1 1 -  Moving slowly or fidgety/restless 0 1 -  Suicidal thoughts 0 0 -  PHQ-9 Score 6 11 -  Difficult doing work/chores Somewhat difficult Not difficult at all -      Review of Systems  Neurological: Positive for headaches.  Psychiatric/Behavioral: Positive for depression.   Patient calls for a follow up on migraine headaches and depression.  Patient states she would discuss her inability to lose weight, body aches and ongoing depression-see PHQ 9     Virtual Visit via Video Note  I connected with [name] on 09/17/19 at  9:40 AM EDT by a video enabled telemedicine application and verified that I am speaking with the correct person using two identifiers.  Location: Patient: home Provider: office   I discussed the limitations of evaluation and management by telemedicine and the availability of in person appointments. The patient expressed understanding and agreed to proceed.  History of Present Illness:    Observations/Objective:   Assessment and Plan:   Follow Up Instructions:    I discussed the assessment and treatment plan with the patient. The patient was provided an opportunity to ask questions and all were answered. The patient agreed with the plan and demonstrated an understanding of the instructions.   The patient was advised to call back or seek an in-person evaluation if the symptoms worsen or if the condition fails to improve as anticipated.  I provided 15 minutes of non-face-to-face  time during this encounter.    Objective:   Physical Exam        Assessment & Plan:

## 2019-09-18 ENCOUNTER — Encounter: Payer: Self-pay | Admitting: Nurse Practitioner

## 2019-09-18 ENCOUNTER — Other Ambulatory Visit: Payer: Self-pay | Admitting: Family Medicine

## 2019-09-21 ENCOUNTER — Other Ambulatory Visit: Payer: Self-pay | Admitting: Nurse Practitioner

## 2019-09-21 MED ORDER — AIMOVIG 70 MG/ML ~~LOC~~ SOAJ: 70.0000 mg | SUBCUTANEOUS | 2 refills | Status: DC

## 2019-09-23 ENCOUNTER — Telehealth: Payer: Self-pay | Admitting: Nurse Practitioner

## 2019-09-23 NOTE — Telephone Encounter (Signed)
PA attempted through Cover My Meds for Aimovig 70mg /ml. Await decision

## 2019-09-28 NOTE — Telephone Encounter (Signed)
Med approved. Pt and pharm notified. Approval letter sent to medical records to be scanned

## 2019-10-19 ENCOUNTER — Other Ambulatory Visit: Payer: Self-pay | Admitting: Family Medicine

## 2019-12-09 ENCOUNTER — Telehealth: Payer: Self-pay | Admitting: Family Medicine

## 2019-12-09 NOTE — Telephone Encounter (Signed)
Received form from the hospital Uncertain if this is for a first-time infusion or a reauthorization or just a signature.  Please look into it prepare form accordingly then I will sign once forwarded back to me thank you

## 2019-12-12 ENCOUNTER — Telehealth: Payer: Self-pay | Admitting: Family Medicine

## 2019-12-12 DIAGNOSIS — E538 Deficiency of other specified B group vitamins: Secondary | ICD-10-CM

## 2019-12-12 DIAGNOSIS — Z79899 Other long term (current) drug therapy: Secondary | ICD-10-CM

## 2019-12-12 DIAGNOSIS — Z13220 Encounter for screening for lipoid disorders: Secondary | ICD-10-CM

## 2019-12-12 DIAGNOSIS — R5383 Other fatigue: Secondary | ICD-10-CM

## 2019-12-12 NOTE — Telephone Encounter (Signed)
Nurses Patient is being scheduled for her infusion to help protect against osteoporosis Patient will need up-to-date metabolic 7 Also needs lipid, B12 Diagnosis osteoporosis, high risk medication, B12 deficiency, hyperlipidemia Finally patient should do a follow-up office visit later by fall time

## 2019-12-13 NOTE — Addendum Note (Signed)
Addended by: Dairl Ponder on: 12/13/2019 08:31 AM   Modules accepted: Orders

## 2019-12-13 NOTE — Telephone Encounter (Signed)
We ordered this 07/2018 but I do not see that patient ever had it done Checked appropriate box on form & faxed order for Reclast

## 2019-12-13 NOTE — Telephone Encounter (Signed)
Blood work ordered in [employer]. Patient notified and stated she would go tis week to have blood work done.

## 2019-12-14 ENCOUNTER — Ambulatory Visit (INDEPENDENT_AMBULATORY_CARE_PROVIDER_SITE_OTHER): Payer: Medicare Other | Admitting: *Deleted

## 2019-12-14 DIAGNOSIS — I471 Supraventricular tachycardia: Secondary | ICD-10-CM

## 2019-12-14 LAB — CUP PACEART REMOTE DEVICE CHECK
Battery Remaining Longevity: 128 mo
Battery Remaining Percentage: 95.5 %
Battery Voltage: 2.99 V
Brady Statistic AP VP Percent: 1 %
Brady Statistic AP VS Percent: 1 %
Brady Statistic AS VP Percent: 99 %
Brady Statistic AS VS Percent: 1 %
Brady Statistic RA Percent Paced: 1 %
Brady Statistic RV Percent Paced: 99 %
Date Time Interrogation Session: 20210713031448
Implantable Lead Implant Date: 20170418
Implantable Lead Implant Date: 20170418
Implantable Lead Location: 753859
Implantable Lead Location: 753860
Implantable Pulse Generator Implant Date: 20170418
Lead Channel Impedance Value: 460 Ohm
Lead Channel Impedance Value: 530 Ohm
Lead Channel Pacing Threshold Amplitude: 0.5 V
Lead Channel Pacing Threshold Amplitude: 0.875 V
Lead Channel Pacing Threshold Pulse Width: 0.5 ms
Lead Channel Pacing Threshold Pulse Width: 0.5 ms
Lead Channel Sensing Intrinsic Amplitude: 11.2 mV
Lead Channel Sensing Intrinsic Amplitude: 5 mV
Lead Channel Setting Pacing Amplitude: 1.125
Lead Channel Setting Pacing Amplitude: 2 V
Lead Channel Setting Pacing Pulse Width: 0.5 ms
Lead Channel Setting Sensing Sensitivity: 2 mV
Pulse Gen Model: 2272
Pulse Gen Serial Number: 7882213

## 2019-12-15 LAB — LIPID PANEL
Chol/HDL Ratio: 3.6 ratio (ref 0.0–4.4)
Cholesterol, Total: 267 mg/dL — ABNORMAL HIGH (ref 100–199)
HDL: 74 mg/dL (ref >39)
LDL Chol Calc (NIH): 170 mg/dL — ABNORMAL HIGH (ref 0–99)
Triglycerides: 130 mg/dL (ref 0–149)
VLDL Cholesterol Cal: 23 mg/dL (ref 5–40)

## 2019-12-15 LAB — BASIC METABOLIC PANEL
BUN/Creatinine Ratio: 16 (ref 12–28)
BUN: 13 mg/dL (ref 8–27)
CO2: 24 mmol/L (ref 20–29)
Calcium: 9.3 mg/dL (ref 8.7–10.3)
Chloride: 105 mmol/L (ref 96–106)
Creatinine, Ser: 0.79 mg/dL (ref 0.57–1.00)
GFR calc Af Amer: 90 mL/min/{1.73_m2} (ref >59)
GFR calc non Af Amer: 78 mL/min/{1.73_m2} (ref >59)
Glucose: 90 mg/dL (ref 65–99)
Potassium: 4.7 mmol/L (ref 3.5–5.2)
Sodium: 143 mmol/L (ref 134–144)

## 2019-12-15 LAB — VITAMIN B12: Vitamin B-12: 1490 pg/mL — ABNORMAL HIGH (ref 232–1245)

## 2019-12-15 NOTE — Progress Notes (Signed)
Remote pacemaker transmission.   

## 2019-12-21 ENCOUNTER — Telehealth: Payer: Self-pay | Admitting: *Deleted

## 2019-12-21 NOTE — Telephone Encounter (Signed)
Aimovig Soln Auto-Inj 70mg /ml approved by insurance for one unit or injection per 30 days- Approval good from 12/06/19-03/22/20  [facility] in [place] notified and stated the medication went thru and patient picked it up 12/12/19

## 2020-01-07 ENCOUNTER — Encounter: Payer: Self-pay | Admitting: *Deleted

## 2020-01-07 ENCOUNTER — Encounter: Payer: Self-pay | Admitting: Nurse Practitioner

## 2020-01-07 ENCOUNTER — Ambulatory Visit (INDEPENDENT_AMBULATORY_CARE_PROVIDER_SITE_OTHER): Payer: Medicare Other | Admitting: Nurse Practitioner

## 2020-01-07 ENCOUNTER — Other Ambulatory Visit: Payer: Self-pay

## 2020-01-07 VITALS — BP 118/74 | HR 78 | Temp 97.7°F | Ht 63.5 in | Wt 176.0 lb

## 2020-01-07 DIAGNOSIS — Z1231 Encounter for screening mammogram for malignant neoplasm of breast: Secondary | ICD-10-CM | POA: Diagnosis not present

## 2020-01-07 DIAGNOSIS — Z780 Asymptomatic menopausal state: Secondary | ICD-10-CM | POA: Diagnosis not present

## 2020-01-07 DIAGNOSIS — K2990 Gastroduodenitis, unspecified, without bleeding: Secondary | ICD-10-CM

## 2020-01-07 DIAGNOSIS — L578 Other skin changes due to chronic exposure to nonionizing radiation: Secondary | ICD-10-CM | POA: Diagnosis not present

## 2020-01-07 DIAGNOSIS — F418 Other specified anxiety disorders: Secondary | ICD-10-CM | POA: Diagnosis not present

## 2020-01-07 DIAGNOSIS — Z23 Encounter for immunization: Secondary | ICD-10-CM | POA: Diagnosis not present

## 2020-01-07 DIAGNOSIS — K581 Irritable bowel syndrome with constipation: Secondary | ICD-10-CM | POA: Diagnosis not present

## 2020-01-07 DIAGNOSIS — Z0000 Encounter for general adult medical examination without abnormal findings: Secondary | ICD-10-CM | POA: Diagnosis not present

## 2020-01-07 DIAGNOSIS — K2970 Gastritis, unspecified, without bleeding: Secondary | ICD-10-CM | POA: Diagnosis not present

## 2020-01-07 DIAGNOSIS — Z01419 Encounter for gynecological examination (general) (routine) without abnormal findings: Secondary | ICD-10-CM | POA: Diagnosis not present

## 2020-01-07 DIAGNOSIS — E785 Hyperlipidemia, unspecified: Secondary | ICD-10-CM | POA: Diagnosis not present

## 2020-01-07 DIAGNOSIS — M810 Age-related osteoporosis without current pathological fracture: Secondary | ICD-10-CM

## 2020-01-07 DIAGNOSIS — Z79899 Other long term (current) drug therapy: Secondary | ICD-10-CM | POA: Diagnosis not present

## 2020-01-07 NOTE — Progress Notes (Signed)
Subjective:    Patient ID: [name], female    DOB: Nov 26, 1950, 69 y.o.   MRN: [id number]  HPI AWV- Annual Wellness Visit  The patient was seen for their annual wellness visit. The patient's past medical history, surgical history, and family history were reviewed. Pertinent vaccines were reviewed ( tetanus, pneumonia, shingles, flu) The patient's medication list was reviewed and updated.  The height and weight were entered.  BMI recorded in electronic record elsewhere  Cognitive screening was completed. Outcome of Mini - Cog: pass   Falls /depression screening electronically recorded within record elsewhere  Current tobacco usage: none (All patients who use tobacco were given written and verbal information on quitting)  Recent listing of emergency department/hospitalizations over the past year were reviewed.  current specialist the patient sees on a regular basis: cardiologist for pacemaker Dr. Cheryln Manly   Medicare annual wellness visit patient questionnaire was reviewed.  A written screening schedule for the patient for the next 5-10 years was given. Appropriate discussion of followup regarding next visit was discussed.  Concerns about a spot on back that it itchy. Has been there several days.  Stomach pain. States it feels like it did when she had gastritis. Having some swelling. And having some soreness in mid back on both sides. This has improved.   Would like to discuss something for constipation.  Depression screen [facility] 2/9 09/17/2019 02/27/2017 08/19/2016  Decreased Interest 0 1 0  Down, Depressed, Hopeless 1 1 -  PHQ - 2 Score 1 2 0  Altered sleeping 1 2 -  Tired, decreased energy 1 2 -  Change in appetite 1 3 -  Feeling bad or failure about yourself  1 0 -  Trouble concentrating 1 1 -  Moving slowly or fidgety/restless 0 1 -  Suicidal thoughts 0 0 -  PHQ-9 Score 6 11 -  Difficult doing work/chores Somewhat difficult Not difficult at all -   Decreased her  Zoloft to 50 mg; doing well; had nightmares on the 150 mg dose.  Saw her cardiologist on 8/17. Scheduled for Reclast on 8/11. Could not continue Fosamax due to GERD. Had some intense abdominal pain a few days ago which has greatly improved. Having issues with her chronic constipation. No longer on Amitiza or Linzess due to costs. Would like to restart Amitiza.  Having epigastric discomfort and reflux symptoms. Not currently on medication. Minimal caffeine intake. No tobacco use. Avoids spicy or citrus in diet.  Regular vision and dental exams.  Diet: overall good Stays active with her grandchildren.      Review of Systems  Constitutional: Negative for activity change, appetite change, fatigue and fever.  Respiratory: Negative for cough, chest tightness, shortness of breath and wheezing.   Cardiovascular: Negative for chest pain and palpitations.  Gastrointestinal: Positive for abdominal pain and constipation. Negative for abdominal distention, blood in stool, diarrhea, nausea and vomiting.  Genitourinary: Negative for difficulty urinating, dysuria, enuresis, frequency, genital sores, menstrual problem, pelvic pain, urgency, vaginal bleeding and vaginal discharge.       Objective:   Physical Exam Constitutional:      General: She is not in acute distress.    Appearance: She is well-developed.  Neck:     Thyroid: No thyromegaly.     Trachea: No tracheal deviation.     Comments: Thyroid non tender to palpation. No mass or goiter noted.  Cardiovascular:     Rate and Rhythm: Normal rate and regular rhythm.     Heart  sounds: Normal heart sounds. No murmur heard.  No gallop.   Pulmonary:     Effort: Pulmonary effort is normal.     Breath sounds: Normal breath sounds.  Chest:     Breasts:        Right: No inverted nipple, skin change or tenderness.        Left: No inverted nipple, skin change or tenderness.     Comments: Bilateral breast implants noted. Exam around the implants  normal.  Abdominal:     General: There is no distension.     Palpations: Abdomen is soft. There is no mass.     Tenderness: There is abdominal tenderness.     Comments: Distinct epigastric tenderness noted.   Genitourinary:    Vagina: Normal. No vaginal discharge.     Comments: External GU: no rashes or lesions. Vagina: no discharge; tissue light pink in color. Cervix normal in appearance. No CMT. Bimanual exam: no tenderness or obvious masses.  Musculoskeletal:     Cervical back: Normal range of motion and neck supple.  Lymphadenopathy:     Cervical: No cervical adenopathy.     Upper Body:     Right upper body: No supraclavicular, axillary or pectoral adenopathy.     Left upper body: No supraclavicular, axillary or pectoral adenopathy.  Skin:    General: Skin is warm and dry.     Findings: No rash.     Comments: Large slightly hypopigmented area 6-8 cm upper mid back as the indicated area of itching. No lesions noted. Significant sun damage noted.   Neurological:     Mental Status: She is alert and oriented to person, place, and time.  Psychiatric:        Mood and Affect: Mood normal.        Behavior: Behavior normal.        Thought Content: Thought content normal.        Judgment: Judgment normal.    Basic metabolic panel     Status: None   Collection Time: 12/14/19  9:04 AM  Result Value Ref Range   Glucose 90 65 - 99 mg/dL   BUN 13 8 - 27 mg/dL   Creatinine, Ser 0.79 0.57 - 1.00 mg/dL   GFR calc non Af Amer 78 >59 mL/min/1.73   GFR calc Af Amer 90 >59 mL/min/1.73    Comment: **[facility] currently reports eGFR in compliance with the current**   recommendations of the [employer]. [facility] will   update reporting as new guidelines are published from the NKF-ASN   Task force.    BUN/Creatinine Ratio 16 12 - 28   Sodium 143 134 - 144 mmol/L   Potassium 4.7 3.5 - 5.2 mmol/L   Chloride 105 96 - 106 mmol/L   CO2 24 20 - 29 mmol/L   Calcium 9.3 8.7 - 10.3 mg/dL    Lipid panel     Status: Abnormal   Collection Time: 12/14/19  9:04 AM  Result Value Ref Range   Cholesterol, Total 267 (H) 100 - 199 mg/dL   Triglycerides 130 0 - 149 mg/dL   HDL 74 >39 mg/dL   VLDL Cholesterol Cal 23 5 - 40 mg/dL   LDL Chol Calc (NIH) 170 (H) 0 - 99 mg/dL   Chol/HDL Ratio 3.6 0.0 - 4.4 ratio    Comment:  T. Chol/HDL Ratio                                             Men  Women                               1/2 Avg.Risk  3.4    3.3                                   Avg.Risk  5.0    4.4                                2X Avg.Risk  9.6    7.1                                3X Avg.Risk 23.4   11.0   Vitamin B12     Status: Abnormal   Collection Time: 12/14/19  9:04 AM  Result Value Ref Range   Vitamin B-12 1,490 (H) 232 - 1,245 pg/mL   Reviewed labs with patient.         Assessment & Plan:   Problem List Items Addressed This Visit      Digestive   Gastritis and gastroduodenitis   Irritable bowel syndrome (IBS)   Relevant Medications   pantoprazole (PROTONIX) 40 MG tablet   lubiprostone (AMITIZA) 24 MCG capsule     Musculoskeletal and Integument   Osteoporosis   Relevant Orders   DG Bone Density   Sun-damaged skin   Relevant Orders   Ambulatory referral to Dermatology     Other   Depression with anxiety    Other Visit Diagnoses    Well woman exam    -  Primary   Relevant Orders   DG Bone Density   Hepatic function panel   Lipid panel   Pneumococcal polysaccharide vaccine 23-valent greater than or equal to 2yo subcutaneous/IM (Completed)   MM 3D SCREEN BREAST W/IMPLANT BILATERAL   Hyperlipidemia LDL goal <130       Relevant Medications   rosuvastatin (CRESTOR) 5 MG tablet   Other Relevant Orders   Lipid panel   High risk medication use       Relevant Orders   Hepatic function panel   Need for vaccination       Relevant Orders   Pneumococcal polysaccharide vaccine 23-valent greater than or equal to 2yo  subcutaneous/IM (Completed)   Encounter for screening mammogram for breast cancer       Relevant Orders   MM 3D SCREEN BREAST W/IMPLANT BILATERAL   Post-menopausal       Relevant Orders   DG Bone Density     DEXA and mammogram scheduled. Pneumovax 23 today. Refer to dermatology for skin cancer screening. Meds ordered this encounter  Medications   triamcinolone cream (KENALOG) 0.1 %    Sig: Apply 1 application topically 2 (two) times daily. Prn rash; use up to 2 weeks    Dispense:  30 g    Refill:  0    Order Specific Question:   Supervising Provider    Answer:   [name] A [[id number]]  pantoprazole (PROTONIX) 40 MG tablet    Sig: Take 1 tablet (40 mg total) by mouth daily. Prn acid reflux    Dispense:  30 tablet    Refill:  2    Order Specific Question:   Supervising Provider    Answer:   [name] A [[id number]]   rosuvastatin (CRESTOR) 5 MG tablet    Sig: Take 1 tablet (5 mg total) by mouth daily.    Dispense:  30 tablet    Refill:  2    Order Specific Question:   Supervising Provider    Answer:   [name] A [[id number]]   lubiprostone (AMITIZA) 24 MCG capsule    Sig: Take 1 capsule (24 mcg total) by mouth 2 (two) times daily with a meal.    Dispense:  60 capsule    Refill:  2    Order Specific Question:   Supervising Provider    Answer:   [name] A [[id number]]   Take Protonix daily for one month. Contact office if pain and reflux symptoms have not resolved. Change to prn dosing once she is better. Contact office if Amitiza does not help constipation. Continue Zoloft 50 mg daily.  Return in about 6 months (around 07/09/2020) for routine follow up.

## 2020-01-08 ENCOUNTER — Encounter: Payer: Self-pay | Admitting: Nurse Practitioner

## 2020-01-08 DIAGNOSIS — L578 Other skin changes due to chronic exposure to nonionizing radiation: Secondary | ICD-10-CM | POA: Insufficient documentation

## 2020-01-08 MED ORDER — TRIAMCINOLONE ACETONIDE 0.1 % EX CREA
1.0000 | TOPICAL_CREAM | Freq: Two times a day (BID) | CUTANEOUS | 0 refills | Status: DC
Start: 2020-01-08 — End: 2021-06-08

## 2020-01-08 MED ORDER — PANTOPRAZOLE SODIUM 40 MG PO TBEC
40.0000 mg | DELAYED_RELEASE_TABLET | Freq: Every day | ORAL | 2 refills | Status: DC
Start: 2020-01-08 — End: 2020-02-10

## 2020-01-08 MED ORDER — ROSUVASTATIN CALCIUM 5 MG PO TABS
5.0000 mg | ORAL_TABLET | Freq: Every day | ORAL | 2 refills | Status: DC
Start: 2020-01-08 — End: 2020-01-14

## 2020-01-08 MED ORDER — LUBIPROSTONE 24 MCG PO CAPS: 24.0000 ug | ORAL_CAPSULE | Freq: Two times a day (BID) | ORAL | 2 refills | Status: DC

## 2020-01-09 ENCOUNTER — Encounter: Payer: Self-pay | Admitting: Nurse Practitioner

## 2020-01-11 NOTE — Telephone Encounter (Signed)
Nurses Please connect with patient I certainly sympathize with her situation Unfortunately medications can sometimes be very expensive and often it varies from 1 Medicare plan to the next.  Another medication that can be used for constipation is Linzess-it too can be expensive but for some plans are less expensive.  I would recommend that the patient connect with her insurance carrier to discuss if there are any constipation medications that have better coverage otherwise it is just more of a guessing game on our part.  We are not able to see what the co-pay will be for various medications on our end of the equation.  As for the cholesterol medicine another option would be a atorvastatin which is a generic of Lipitor.  10 mg 1 daily, #30 with 3 refills she can see if this would be less expensive than the Crestor.  Once again her insurance company may be able to provide her better information.  Thanks-Dr. Nicki Reaper

## 2020-01-12 ENCOUNTER — Encounter (HOSPITAL_COMMUNITY)
Admission: RE | Admit: 2020-01-12 | Discharge: 2020-01-12 | Disposition: A | Discharge status: Home / Self Care | Payer: Medicare Other | Source: Ambulatory Visit | Attending: Family Medicine | Admitting: Family Medicine

## 2020-01-12 ENCOUNTER — Encounter (HOSPITAL_COMMUNITY): Admission: RE | Admit: 2020-01-12 | Payer: Medicare Other | Source: Ambulatory Visit

## 2020-01-12 ENCOUNTER — Other Ambulatory Visit: Payer: Self-pay

## 2020-01-12 DIAGNOSIS — M810 Age-related osteoporosis without current pathological fracture: Secondary | ICD-10-CM | POA: Insufficient documentation

## 2020-01-12 MED ORDER — ZOLEDRONIC ACID 5 MG/100ML IV SOLN
5.0000 mg | Freq: Once | INTRAVENOUS | Status: AC
  Administered 2020-01-12: 5 mg via INTRAVENOUS
  Filled 2020-01-12: qty 100

## 2020-01-12 MED ORDER — SODIUM CHLORIDE 0.9 % IV SOLN: Freq: Once | INTRAVENOUS | Status: AC

## 2020-01-14 ENCOUNTER — Other Ambulatory Visit: Payer: Self-pay | Admitting: Nurse Practitioner

## 2020-01-14 MED ORDER — ATORVASTATIN CALCIUM 10 MG PO TABS
10.0000 mg | ORAL_TABLET | Freq: Every day | ORAL | 2 refills | Status: DC
Start: 2020-01-14 — End: 2020-07-14

## 2020-01-17 ENCOUNTER — Encounter: Payer: Self-pay | Admitting: Family Medicine

## 2020-01-18 ENCOUNTER — Encounter: Payer: Medicare Other | Admitting: Cardiology

## 2020-01-30 ENCOUNTER — Other Ambulatory Visit: Payer: Self-pay | Admitting: Nurse Practitioner

## 2020-02-10 ENCOUNTER — Ambulatory Visit (INDEPENDENT_AMBULATORY_CARE_PROVIDER_SITE_OTHER): Payer: Medicare Other | Admitting: Cardiology

## 2020-02-10 ENCOUNTER — Other Ambulatory Visit: Payer: Self-pay

## 2020-02-10 ENCOUNTER — Encounter: Payer: Self-pay | Admitting: Cardiology

## 2020-02-10 VITALS — BP 134/78 | HR 78 | Ht 63.5 in | Wt 181.0 lb

## 2020-02-10 DIAGNOSIS — I442 Atrioventricular block, complete: Secondary | ICD-10-CM

## 2020-02-10 NOTE — Progress Notes (Signed)
Electrophysiology Office Note   Date:  02/10/2020   ID:  [name], DOB 30-Dec-1950, MRN [id number]  PCP:  Kathyrn Drown, MD  Primary Electrophysiologist:  Constance Haw, MD    No chief complaint on file.    History of Present Illness: [name] is a 69 y.o. female who presents today for electrophysiology evaluation.   Presented to the hospital after being found in complete heart block.  St. Jude dual chamber pacemaker placed 09/19/15.   Today, denies symptoms of palpitations, chest pain, shortness of breath, orthopnea, PND, lower extremity edema, claudication, dizziness, presyncope, syncope, bleeding, or neurologic sequela. The patient is tolerating medications without difficulties.  Since last being seen she has done well.  She has no chest pain or shortness of breath and is able to do all her daily activities.  She has been quite active over this past summer taking care of her grandchildren.  She also continues to work.   Past Medical History:  Diagnosis Date   Acid reflux    Allergy    Anxiety    Complete heart block ([place]) 09/2015   a. s/p St. Jude PPM 09/2015.   Dyspnea    Former tobacco use    Hyperlipidemia    Irritable bowel syndrome (IBS) 02/27/2017   Mast cell disease    Migraines    Chronic   PAT (paroxysmal atrial tachycardia) (HCC)    PONV (postoperative nausea and vomiting)    Presence of permanent cardiac pacemaker 09/19/2015   Sinusitis    Sleep apnea    Urticaria    Past Surgical History:  Procedure Laterality Date   APPENDECTOMY     AUGMENTATION MAMMAPLASTY Bilateral    BIOPSY  06/17/2017   Procedure: BIOPSY;  Surgeon: Danie Binder, MD;  [facility]: [facility];  Service: Endoscopy;;  colon   BREAST ENHANCEMENT SURGERY  1985   CHOLECYSTECTOMY     COLONOSCOPY     COLONOSCOPY WITH PROPOFOL N/A 06/17/2017   Dr. Oneida Alar: 30 mm polyp removed from the ascending colon piecemeal, tattooed, tubular adenoma.  8 mm  polyp in the ascending colon, four 3 to 5 mm polyps removed from the rectum and transverse colon.  Tubular adenomas.  External and internal hemorrhoids.  Redundant colon.  Random colon biopsies negative.  Next colonoscopy in 3 years.   EP IMPLANTABLE DEVICE N/A 09/19/2015   Procedure: Pacemaker Implant;  Surgeon: Leathie Weich Meredith Leeds, MD;  Location: [facility];  Service: Cardiovascular;  Laterality: N/A;   ESOPHAGOGASTRODUODENOSCOPY (EGD) WITH PROPOFOL N/A 06/17/2017   Dr. Oneida Alar: Medium sized hiatal hernia, mild erosive gastritis, small bowel biopsies negative for celiac.  Gastric biopsies with chronic mild inactive gastritis but no H. pylori.   INSERT / REPLACE / REMOVE PACEMAKER     POLYPECTOMY  06/17/2017   Procedure: POLYPECTOMY;  Surgeon: Danie Binder, MD;  Location: [facility];  Service: Endoscopy;;  colon   TONSILLECTOMY AND ADENOIDECTOMY     TUBAL LIGATION       Current Outpatient Medications  Medication Sig Dispense Refill   AIMOVIG 70 MG/ML SOAJ INJECT 70 MG INTO THE SKIN EVERY 30 (THIRTY) DAYS. FOR MIGRAINES 1 mL 2   albuterol (PROVENTIL HFA;VENTOLIN HFA) 108 (90 Base) MCG/ACT inhaler Inhale 2 puffs into the lungs every 6 (six) hours as needed for wheezing or shortness of breath. 1 Inhaler 2   atorvastatin (LIPITOR) 10 MG tablet Take 1 tablet (10 mg total) by mouth daily. For cholesterol  30 tablet 2   Biotin 5000 MCG CAPS Take 5,000 mcg by mouth daily.     bisacodyl (DULCOLAX) 5 MG EC tablet Take 10 mg by mouth daily as needed for mild constipation.      Carboxymethylcellulose Sodium (THERATEARS OP) Place 5 drops into both eyes daily.     fexofenadine (ALLEGRA) 30 MG tablet Take 30 mg by mouth daily.      lubiprostone (AMITIZA) 24 MCG capsule Take 1 capsule (24 mcg total) by mouth 2 (two) times daily with a meal. 60 capsule 2   Multiple Vitamin (MULTIVITAMIN WITH MINERALS) TABS tablet Take 1 tablet by mouth daily.     omeprazole (PRILOSEC) 20 MG capsule  Take 20 mg by mouth daily as needed.      OVER THE COUNTER MEDICATION B12 1000 mcg one daily     pantoprazole (PROTONIX) 40 MG tablet Take 40 mg by mouth daily.     PSYLLIUM PO Take 3 tablets by mouth daily.     rizatriptan (MAXALT) 10 MG tablet Take 10 mg by mouth as needed for migraine. May repeat in 2 hours if needed     sertraline (ZOLOFT) 50 MG tablet Take 50 mg by mouth daily.     triamcinolone cream (KENALOG) 0.1 % Apply 1 application topically 2 (two) times daily. Prn rash; use up to 2 weeks 30 g 0   vitamin C (ASCORBIC ACID) 500 MG tablet Take 500 mg by mouth daily.      No current facility-administered medications for this visit.    Allergies:   Beta adrenergic blockers, Ambien [zolpidem tartrate], Biaxin [clarithromycin], and Fosamax [alendronate sodium]   Social History:  The patient  reports that she quit smoking about 15 years ago. Her smoking use included cigarettes. She has a 30.00 pack-year smoking history. She has never used smokeless tobacco. She reports that she does not drink alcohol and does not use drugs.   Family History:  The patient's family history includes Allergic rhinitis in her maternal grandmother; Asthma in her maternal grandmother and paternal grandmother; Cancer in her maternal grandfather; Congestive Heart Failure in her mother; Dementia in her mother; Diabetes in her maternal grandmother, mother, paternal grandmother, and sister; Heart attack in her paternal grandmother; Heart disease in her paternal grandmother; Lung cancer in her father; Parkinson's disease in her mother.    ROS:  Please see the history of present illness.   Otherwise, review of systems is positive for none.   All other systems are reviewed and negative.   PHYSICAL EXAM: VS:  BP 134/78    Pulse 78    Ht 5' 3.5" (1.613 m)    Wt 181 lb (82.1 kg)    SpO2 96%    BMI 31.56 kg/m  , BMI Body mass index is 31.56 kg/m. GEN: Well nourished, well developed, in no acute distress  HEENT:  normal  Neck: no JVD, carotid bruits, or masses Cardiac: RRR; no murmurs, rubs, or gallops,no edema  Respiratory:  clear to auscultation bilaterally, normal work of breathing GI: soft, nontender, nondistended, + BS MS: no deformity or atrophy  Skin: warm and dry, device site well healed Neuro:  Strength and sensation are intact Psych: euthymic mood, full affect  EKG:  EKG is ordered today. Personal review of the ekg ordered shows sinus rhythm, ventricular paced  Personal review of the device interrogation today. Results in [place]: 12/14/2019: BUN 13; Creatinine, Ser 0.79; Potassium 4.7; Sodium 143    Lipid  Panel     Component Value Date/Time   CHOL 267 (H) 12/14/2019 0904   TRIG 130 12/14/2019 0904   HDL 74 12/14/2019 0904   CHOLHDL 3.6 12/14/2019 0904   CHOLHDL 2.4 01/19/2014 0918   VLDL 13 01/19/2014 0918   LDLCALC 170 (H) 12/14/2019 0904     Wt Readings from Last 3 Encounters:  02/10/20 181 lb (82.1 kg)  01/07/20 176 lb (79.8 kg)  01/12/19 170 lb (77.1 kg)      Other studies Reviewed: Additional studies/ records that were reviewed today include: TTE 01/23/16 - Left ventricle: The cavity size was normal. Wall thickness was   normal. Systolic function was normal. The estimated ejection   fraction was in the range of 55% to 60%. Wall motion was normal;   there were no regional wall motion abnormalities. Doppler   parameters are consistent with abnormal left ventricular   relaxation (grade 1 diastolic dysfunction). - Aortic valve: Trileaflet; mildly calcified leaflets. - Right ventricle: Pacer wire or catheter noted in right ventricle. - Right atrium: Central venous pressure (est): 3 mm Hg. - Tricuspid valve: There was trivial regurgitation. - Pulmonary arteries: PA peak pressure: 20 mm Hg (S). - Pericardium, extracardiac: There was no pericardial effusion.  Holter 02/12/16 Sinus rhythm 1 degree AV block Minimum HR: 50 BPM at 12:59:11 AM Maximum  HR: 118 BPM at 8:00:47 AM Average HR: 74 BPM.  ASSESSMENT AND PLAN:  1.  Complete heart block: Status post Saint Jude dual-chamber pacemaker implanted 09/19/2018.  Device functioning appropriately.  No changes at this time.    2.  Hyperlipidemia: Atorvastatin per primary physician  Current medicines are reviewed at length with the patient today.   The patient does not have concerns regarding her medicines.  The following changes were made today:  none  Labs/ tests ordered today include:  Orders Placed This Encounter  Procedures   EKG 12-Lead     Disposition:   FU with Elio Haden 12 months  Signed, Emiya Loomer Meredith Leeds, MD  02/10/2020 3:11 PM     [facility] [address] [place] [address] [phone] (office) [phone] (fax)

## 2020-02-11 ENCOUNTER — Ambulatory Visit (HOSPITAL_COMMUNITY)
Admission: RE | Admit: 2020-02-11 | Discharge: 2020-02-11 | Disposition: A | Discharge status: Home / Self Care | Payer: Medicare Other | Source: Ambulatory Visit | Attending: Nurse Practitioner | Admitting: Nurse Practitioner

## 2020-02-11 DIAGNOSIS — Z01419 Encounter for gynecological examination (general) (routine) without abnormal findings: Secondary | ICD-10-CM | POA: Insufficient documentation

## 2020-02-11 DIAGNOSIS — Z8262 Family history of osteoporosis: Secondary | ICD-10-CM | POA: Diagnosis not present

## 2020-02-11 DIAGNOSIS — Z1231 Encounter for screening mammogram for malignant neoplasm of breast: Secondary | ICD-10-CM | POA: Insufficient documentation

## 2020-02-11 DIAGNOSIS — M810 Age-related osteoporosis without current pathological fracture: Secondary | ICD-10-CM | POA: Insufficient documentation

## 2020-02-11 DIAGNOSIS — Z780 Asymptomatic menopausal state: Secondary | ICD-10-CM | POA: Insufficient documentation

## 2020-02-11 DIAGNOSIS — M8589 Other specified disorders of bone density and structure, multiple sites: Secondary | ICD-10-CM | POA: Diagnosis not present

## 2020-02-13 ENCOUNTER — Encounter: Payer: Self-pay | Admitting: Nurse Practitioner

## 2020-02-28 ENCOUNTER — Other Ambulatory Visit: Payer: Self-pay | Admitting: Nurse Practitioner

## 2020-03-01 ENCOUNTER — Encounter: Payer: Self-pay | Admitting: Nurse Practitioner

## 2020-03-11 ENCOUNTER — Other Ambulatory Visit: Payer: Self-pay | Admitting: Nurse Practitioner

## 2020-03-14 ENCOUNTER — Ambulatory Visit (INDEPENDENT_AMBULATORY_CARE_PROVIDER_SITE_OTHER): Payer: Medicare Other

## 2020-03-14 DIAGNOSIS — I442 Atrioventricular block, complete: Secondary | ICD-10-CM

## 2020-03-19 LAB — CUP PACEART REMOTE DEVICE CHECK
Battery Remaining Longevity: 129 mo
Battery Remaining Percentage: 95.5 %
Battery Voltage: 2.99 V
Brady Statistic AP VP Percent: 1 %
Brady Statistic AP VS Percent: 1 %
Brady Statistic AS VP Percent: 99 %
Brady Statistic AS VS Percent: 1 %
Brady Statistic RA Percent Paced: 1 %
Brady Statistic RV Percent Paced: 99 %
Date Time Interrogation Session: 20211012035938
Implantable Lead Implant Date: 20170418
Implantable Lead Implant Date: 20170418
Implantable Lead Location: 753859
Implantable Lead Location: 753860
Implantable Pulse Generator Implant Date: 20170418
Lead Channel Impedance Value: 510 Ohm
Lead Channel Impedance Value: 600 Ohm
Lead Channel Pacing Threshold Amplitude: 0.5 V
Lead Channel Pacing Threshold Amplitude: 1.125 V
Lead Channel Pacing Threshold Pulse Width: 0.5 ms
Lead Channel Pacing Threshold Pulse Width: 0.5 ms
Lead Channel Sensing Intrinsic Amplitude: 11.2 mV
Lead Channel Sensing Intrinsic Amplitude: 5 mV
Lead Channel Setting Pacing Amplitude: 1.375
Lead Channel Setting Pacing Amplitude: 2 V
Lead Channel Setting Pacing Pulse Width: 0.5 ms
Lead Channel Setting Sensing Sensitivity: 2 mV
Pulse Gen Model: 2272
Pulse Gen Serial Number: 7882213

## 2020-03-20 NOTE — Progress Notes (Signed)
Remote pacemaker transmission.   

## 2020-03-31 ENCOUNTER — Other Ambulatory Visit: Payer: Self-pay | Admitting: Nurse Practitioner

## 2020-05-22 ENCOUNTER — Encounter: Payer: Self-pay | Admitting: Internal Medicine

## 2020-05-25 ENCOUNTER — Other Ambulatory Visit: Payer: Self-pay | Admitting: Family Medicine

## 2020-06-05 ENCOUNTER — Encounter: Payer: Self-pay | Admitting: Family Medicine

## 2020-06-05 ENCOUNTER — Other Ambulatory Visit: Payer: Self-pay | Admitting: Family Medicine

## 2020-06-05 ENCOUNTER — Ambulatory Visit (HOSPITAL_COMMUNITY)
Admission: RE | Admit: 2020-06-05 | Discharge: 2020-06-05 | Disposition: A | Discharge status: Home / Self Care | Payer: Medicare Other | Source: Ambulatory Visit | Attending: Family Medicine | Admitting: Family Medicine

## 2020-06-05 ENCOUNTER — Ambulatory Visit (INDEPENDENT_AMBULATORY_CARE_PROVIDER_SITE_OTHER): Payer: Medicare Other | Admitting: Family Medicine

## 2020-06-05 ENCOUNTER — Other Ambulatory Visit: Payer: Self-pay

## 2020-06-05 VITALS — HR 100 | Temp 98.0°F | Resp 28

## 2020-06-05 DIAGNOSIS — J40 Bronchitis, not specified as acute or chronic: Secondary | ICD-10-CM

## 2020-06-05 DIAGNOSIS — R0602 Shortness of breath: Secondary | ICD-10-CM

## 2020-06-05 DIAGNOSIS — R059 Cough, unspecified: Secondary | ICD-10-CM | POA: Diagnosis not present

## 2020-06-05 DIAGNOSIS — R062 Wheezing: Secondary | ICD-10-CM | POA: Diagnosis not present

## 2020-06-05 MED ORDER — GUAIFENESIN-CODEINE 100-10 MG/5ML PO SOLN: 5.0000 mL | Freq: Three times a day (TID) | ORAL | 0 refills | Status: DC | PRN

## 2020-06-05 MED ORDER — BENZONATATE 100 MG PO CAPS: 100.0000 mg | ORAL_CAPSULE | Freq: Two times a day (BID) | ORAL | 0 refills | Status: DC | PRN

## 2020-06-05 MED ORDER — DOXYCYCLINE HYCLATE 100 MG PO TABS: 100.0000 mg | ORAL_TABLET | Freq: Two times a day (BID) | ORAL | 0 refills | Status: DC

## 2020-06-05 MED ORDER — LEVOFLOXACIN 500 MG PO TABS: 500.0000 mg | ORAL_TABLET | Freq: Every day | ORAL | 0 refills | Status: DC

## 2020-06-05 MED ORDER — PREDNISONE 20 MG PO TABS: ORAL_TABLET | ORAL | 0 refills | Status: DC

## 2020-06-05 NOTE — Progress Notes (Unsigned)
Patient ID: [name], female    DOB: January 05, 1951, 70 y.o.   MRN: [id number]   No chief complaint on file.  Subjective:    HPI   Medical History [name] has a past medical history of Acid reflux, Allergy, Anxiety, Complete heart block ([place]) (09/2015), Dyspnea, Former tobacco use, Hyperlipidemia, Irritable bowel syndrome (IBS) (02/27/2017), Mast cell disease, Migraines, PAT (paroxysmal atrial tachycardia) (HCC), PONV (postoperative nausea and vomiting), Presence of permanent cardiac pacemaker (09/19/2015), Sinusitis, Sleep apnea, and Urticaria.   Outpatient Encounter Medications as of 06/05/2020  Medication Sig  . benzonatate (TESSALON) 100 MG capsule Take 1 capsule (100 mg total) by mouth 2 (two) times daily as needed for cough.  Marland Kitchen guaiFENesin-codeine 100-10 MG/5ML syrup Take 5 mLs by mouth 3 (three) times daily as needed for cough. Use at night  . predniSONE (DELTASONE) 20 MG tablet Take 3 tablets by mouth for 3 days, then 2 tablets by mouth for 3 days, then one tablet by mouth for 3 days.  Marland Kitchen AIMOVIG 70 MG/ML SOAJ INJECT 70 MG INTO THE SKIN EVERY 30 (THIRTY) DAYS. FOR MIGRAINES  . albuterol (PROVENTIL HFA;VENTOLIN HFA) 108 (90 Base) MCG/ACT inhaler Inhale 2 puffs into the lungs every 6 (six) hours as needed for wheezing or shortness of breath.  Marland Kitchen atorvastatin (LIPITOR) 10 MG tablet Take 1 tablet (10 mg total) by mouth daily. For cholesterol  . Biotin 5000 MCG CAPS Take 5,000 mcg by mouth daily.  . bisacodyl (DULCOLAX) 5 MG EC tablet Take 10 mg by mouth daily as needed for mild constipation.   . Carboxymethylcellulose Sodium (THERATEARS OP) Place 5 drops into both eyes daily.  . fexofenadine (ALLEGRA) 30 MG tablet Take 30 mg by mouth daily.   Marland Kitchen levofloxacin (LEVAQUIN) 500 MG tablet Take 1 tablet (500 mg total) by mouth daily.  Marland Kitchen lubiprostone (AMITIZA) 24 MCG capsule Take 1 capsule (24 mcg total) by mouth 2 (two) times daily with a meal.  . Multiple Vitamin (MULTIVITAMIN WITH MINERALS)  TABS tablet Take 1 tablet by mouth daily.  Marland Kitchen omeprazole (PRILOSEC) 20 MG capsule Take 20 mg by mouth daily as needed.   Marland Kitchen OVER THE COUNTER MEDICATION B12 1000 mcg one daily  . pantoprazole (PROTONIX) 40 MG tablet TAKE 1 TABLET BY MOUTH DAILY AS NEEDED FOR ACID REFLUX  . PSYLLIUM PO Take 3 tablets by mouth daily.  . rizatriptan (MAXALT) 10 MG tablet TAKE AS DIRECTED  . sertraline (ZOLOFT) 50 MG tablet TAKE 1 1/2 TABS (75 MG) DAILY  . triamcinolone cream (KENALOG) 0.1 % Apply 1 application topically 2 (two) times daily. Prn rash; use up to 2 weeks  . vitamin C (ASCORBIC ACID) 500 MG tablet Take 500 mg by mouth daily.    No facility-administered encounter medications on file as of 06/05/2020.     Review of Systems   Vitals There were no vitals taken for this visit.  Objective:   Physical Exam   Assessment and Plan   1. Cough - predniSONE (DELTASONE) 20 MG tablet; Take 3 tablets by mouth for 3 days, then 2 tablets by mouth for 3 days, then one tablet by mouth for 3 days.  Dispense: 18 tablet; Refill: 0 - benzonatate (TESSALON) 100 MG capsule; Take 1 capsule (100 mg total) by mouth 2 (two) times daily as needed for cough.  Dispense: 20 capsule; Refill: 0 - guaiFENesin-codeine 100-10 MG/5ML syrup; Take 5 mLs by mouth 3 (three) times daily as needed for cough. Use at night  Dispense: 120  mL; Refill: 0  2. Bronchitis - predniSONE (DELTASONE) 20 MG tablet; Take 3 tablets by mouth for 3 days, then 2 tablets by mouth for 3 days, then one tablet by mouth for 3 days.  Dispense: 18 tablet; Refill: 0      [name], NP 06/05/2020

## 2020-06-05 NOTE — Progress Notes (Signed)
Patient ID: [name], female    DOB: December 01, 1950, 70 y.o.   MRN: [id number]   Chief Complaint  Patient presents with  . Cough    Fever, congestion and body aches, wheezing since Tuesday Patient states her Covid test was negative last week- but having issues with wheezing and headaches and SOB   Subjective:  CC: fever, congestion, body aches, wheezing,   This is a new problem.  Presents today for an acute visit with complaint of fever, congestion, body aches, and wheezing.  Associated symptoms include headache and shortness of breath.  T-max 100.8.  Symptoms started on Tuesday night.  Took a Covid test last week, it was negative.  Has a history of asthma, uses albuterol has been using it with this illness.  She does have shortness of breath during interview, has to stop to take a deep breath.    Medical History [name] has a past medical history of Acid reflux, Allergy, Anxiety, Complete heart block (HCC) (09/2015), Dyspnea, Former tobacco use, Hyperlipidemia, Irritable bowel syndrome (IBS) (02/27/2017), Mast cell disease, Migraines, PAT (paroxysmal atrial tachycardia) (HCC), PONV (postoperative nausea and vomiting), Presence of permanent cardiac pacemaker (09/19/2015), Sinusitis, Sleep apnea, and Urticaria.   Outpatient Encounter Medications as of 06/05/2020  Medication Sig  . levofloxacin (LEVAQUIN) 500 MG tablet Take 1 tablet (500 mg total) by mouth daily.  . [DISCONTINUED] doxycycline (VIBRA-TABS) 100 MG tablet Take 1 tablet (100 mg total) by mouth 2 (two) times daily.  Marland Kitchen AIMOVIG 70 MG/ML SOAJ INJECT 70 MG INTO THE SKIN EVERY 30 (THIRTY) DAYS. FOR MIGRAINES  . albuterol (PROVENTIL HFA;VENTOLIN HFA) 108 (90 Base) MCG/ACT inhaler Inhale 2 puffs into the lungs every 6 (six) hours as needed for wheezing or shortness of breath.  Marland Kitchen atorvastatin (LIPITOR) 10 MG tablet Take 1 tablet (10 mg total) by mouth daily. For cholesterol  . Biotin 5000 MCG CAPS Take 5,000 mcg by mouth daily.  .  bisacodyl (DULCOLAX) 5 MG EC tablet Take 10 mg by mouth daily as needed for mild constipation.   . Carboxymethylcellulose Sodium (THERATEARS OP) Place 5 drops into both eyes daily.  . fexofenadine (ALLEGRA) 30 MG tablet Take 30 mg by mouth daily.   Marland Kitchen lubiprostone (AMITIZA) 24 MCG capsule Take 1 capsule (24 mcg total) by mouth 2 (two) times daily with a meal.  . Multiple Vitamin (MULTIVITAMIN WITH MINERALS) TABS tablet Take 1 tablet by mouth daily.  Marland Kitchen omeprazole (PRILOSEC) 20 MG capsule Take 20 mg by mouth daily as needed.   Marland Kitchen OVER THE COUNTER MEDICATION B12 1000 mcg one daily  . pantoprazole (PROTONIX) 40 MG tablet TAKE 1 TABLET BY MOUTH DAILY AS NEEDED FOR ACID REFLUX  . PSYLLIUM PO Take 3 tablets by mouth daily.  . rizatriptan (MAXALT) 10 MG tablet TAKE AS DIRECTED  . sertraline (ZOLOFT) 50 MG tablet TAKE 1 1/2 TABS (75 MG) DAILY  . triamcinolone cream (KENALOG) 0.1 % Apply 1 application topically 2 (two) times daily. Prn rash; use up to 2 weeks  . vitamin C (ASCORBIC ACID) 500 MG tablet Take 500 mg by mouth daily.    No facility-administered encounter medications on file as of 06/05/2020.     Review of Systems  Constitutional: Positive for fever. Negative for chills.  Respiratory: Positive for shortness of breath and wheezing. Negative for cough.   Musculoskeletal: Positive for myalgias.  Neurological: Positive for headaches.     Vitals Pulse 100   Temp 98 F (36.7 C)   Resp (!)  28   SpO2 98%   Objective:   Physical Exam Vitals reviewed.  Constitutional:      Appearance: Normal appearance. She is ill-appearing.  Cardiovascular:     Rate and Rhythm: Normal rate and regular rhythm.     Heart sounds: Normal heart sounds.  Pulmonary:     Breath sounds: Rales present.     Comments: Increased rate, shortness of breath with talking. Saturations 98-100% Abdominal:     General: Bowel sounds are normal.  Skin:    General: Skin is warm and dry.  Neurological:     General: No  focal deficit present.     Mental Status: She is alert.  Psychiatric:        Behavior: Behavior normal.      Assessment and Plan   1. Cough - DG Chest 2 View - levofloxacin (LEVAQUIN) 500 MG tablet; Take 1 tablet (500 mg total) by mouth daily.  Dispense: 7 tablet; Refill: 0 - Novel Coronavirus, NAA ([facility])  2. SOB (shortness of breath) - DG Chest 2 View - levofloxacin (LEVAQUIN) 500 MG tablet; Take 1 tablet (500 mg total) by mouth daily.  Dispense: 7 tablet; Refill: 0 - Novel Coronavirus, NAA ([facility])  3. Bronchitis    Concerned for shortness of breath with conversation.  Oxygen saturations are 98 to 100% in the office.  Respiratory rate is elevated.  Will send for stat chest x-ray to rule out pneumonia.  Update: Chest x-ray results are negative for pneumonia.  Phoned [name] to inform her of this information.  Will send prednisone taper for bronchitis, and cough medication in addition to the antibiotic already sent.  Agrees with plan of care discussed today. Understands warning signs to seek further care: Increasing shortness of breath that does not resolve, COVID-19 respiratory warning reiterated. Understands to follow-up if symptoms worsen, do not improve.  Work note sent through my chart.  She understands that if she is not improving, or her shortness of breath is worsening, she is to go to the emergency department without delay.  Will notify of Covid results once they become available.   Covid-19 warning:  Covid-19 is a virus that causes hypoxia (low oxygen level in blood) in some people. If you develop any changes in your usual breathing pattern: difficulty catching your breath, more short winded with activity or with resting, or anything that concerns you about your breathing, do not hesitate to go to the emergency department immediately for evaluation. Covid infection can also affect the way the brain can function if it lacks oxygen, such as, feeling dizzy, passing out, or  feeling confused, if you experience any of these symptoms, please do not delay to seek treatment.  Some people experience gastrointestinal problems with Covid, such as vomiting and diarrhea, dehydration is a serious risk and should be avoided. If you are unable to keep liquids down you may need to go to the emergency department for intravenous fluids to avoid dehydration.   Please alert and involve your family and/or friends to help keep an eye on you while you recover from Covid-19. If you have any questions or concerns about your recovery, please do not hesitate to call the office for guidance.   Pecolia Ades, FNP-C

## 2020-06-06 ENCOUNTER — Telehealth: Payer: Self-pay | Admitting: *Deleted

## 2020-06-06 ENCOUNTER — Encounter: Payer: Self-pay | Admitting: Family Medicine

## 2020-06-06 LAB — SARS-COV-2, NAA 2 DAY TAT

## 2020-06-06 LAB — NOVEL CORONAVIRUS, NAA: SARS-CoV-2, NAA: NOT DETECTED

## 2020-06-13 ENCOUNTER — Ambulatory Visit (INDEPENDENT_AMBULATORY_CARE_PROVIDER_SITE_OTHER): Payer: Medicare Other

## 2020-06-13 DIAGNOSIS — I442 Atrioventricular block, complete: Secondary | ICD-10-CM

## 2020-06-14 LAB — CUP PACEART REMOTE DEVICE CHECK
Battery Remaining Longevity: 128 mo
Battery Remaining Percentage: 95.5 %
Battery Voltage: 2.98 V
Brady Statistic AP VP Percent: 1 %
Brady Statistic AP VS Percent: 1 %
Brady Statistic AS VP Percent: 99 %
Brady Statistic AS VS Percent: 1 %
Brady Statistic RA Percent Paced: 1 %
Brady Statistic RV Percent Paced: 99 %
Date Time Interrogation Session: 20220111073753
Implantable Lead Implant Date: 20170418
Implantable Lead Implant Date: 20170418
Implantable Lead Location: 753859
Implantable Lead Location: 753860
Implantable Pulse Generator Implant Date: 20170418
Lead Channel Impedance Value: 450 Ohm
Lead Channel Impedance Value: 530 Ohm
Lead Channel Pacing Threshold Amplitude: 0.5 V
Lead Channel Pacing Threshold Amplitude: 0.875 V
Lead Channel Pacing Threshold Pulse Width: 0.5 ms
Lead Channel Pacing Threshold Pulse Width: 0.5 ms
Lead Channel Sensing Intrinsic Amplitude: 11.2 mV
Lead Channel Sensing Intrinsic Amplitude: 5 mV
Lead Channel Setting Pacing Amplitude: 1.125
Lead Channel Setting Pacing Amplitude: 2 V
Lead Channel Setting Pacing Pulse Width: 0.5 ms
Lead Channel Setting Sensing Sensitivity: 2 mV
Pulse Gen Model: 2272
Pulse Gen Serial Number: 7882213

## 2020-06-22 ENCOUNTER — Telehealth: Payer: Self-pay | Admitting: *Deleted

## 2020-06-22 ENCOUNTER — Other Ambulatory Visit: Payer: Self-pay | Admitting: *Deleted

## 2020-06-22 ENCOUNTER — Other Ambulatory Visit: Payer: Self-pay | Admitting: Family Medicine

## 2020-06-22 ENCOUNTER — Encounter: Payer: Self-pay | Admitting: Family Medicine

## 2020-06-22 DIAGNOSIS — K1379 Other lesions of oral mucosa: Secondary | ICD-10-CM | POA: Insufficient documentation

## 2020-06-22 MED ORDER — MAGIC MOUTHWASH: 10.0000 mL | Freq: Four times a day (QID) | ORAL | 0 refills | Status: DC

## 2020-06-22 NOTE — Telephone Encounter (Signed)
[facility] called and stated they can not get the magic mouthwash- Please advise

## 2020-06-22 NOTE — Telephone Encounter (Signed)
Called [facility] to get the ratio and was told that she already spoke with a nurse about this and was told script was going to be sent to a pharm that did compounds. Called pt because I know [place] [facility] does compounds and asked if it was ok to send it there and she did want it sent to [place]. I faxed over script

## 2020-06-22 NOTE — Telephone Encounter (Signed)
She can make it herself with liquid benadryl and Maalox (not sure if its a one to one ratio or not) Thanks, KD

## 2020-06-28 ENCOUNTER — Other Ambulatory Visit: Payer: Self-pay | Admitting: Family Medicine

## 2020-06-30 NOTE — Progress Notes (Signed)
Remote pacemaker transmission.   

## 2020-07-14 ENCOUNTER — Ambulatory Visit (INDEPENDENT_AMBULATORY_CARE_PROVIDER_SITE_OTHER): Payer: Medicare Other | Admitting: Family Medicine

## 2020-07-14 ENCOUNTER — Other Ambulatory Visit: Payer: Self-pay

## 2020-07-14 ENCOUNTER — Encounter: Payer: Self-pay | Admitting: Family Medicine

## 2020-07-14 VITALS — BP 116/70 | HR 76 | Temp 96.9°F | Ht 63.5 in | Wt 179.0 lb

## 2020-07-14 DIAGNOSIS — G43709 Chronic migraine without aura, not intractable, without status migrainosus: Secondary | ICD-10-CM | POA: Diagnosis not present

## 2020-07-14 DIAGNOSIS — E785 Hyperlipidemia, unspecified: Secondary | ICD-10-CM | POA: Diagnosis not present

## 2020-07-14 DIAGNOSIS — Z79899 Other long term (current) drug therapy: Secondary | ICD-10-CM

## 2020-07-14 MED ORDER — TOPIRAMATE 100 MG PO TABS
100.0000 mg | ORAL_TABLET | Freq: Two times a day (BID) | ORAL | 5 refills | Status: DC
Start: 2020-07-14 — End: 2021-01-11

## 2020-07-14 MED ORDER — SERTRALINE HCL 50 MG PO TABS: ORAL_TABLET | ORAL | 1 refills | Status: DC

## 2020-07-14 NOTE — Progress Notes (Signed)
   Subjective:    Patient ID: [name], female    DOB: 10-09-1950, 70 y.o.   MRN: [id number]  HPImed check up.   Still having fatigue from URI infection on December 28th. Cough and congestion since then. States it is better but not gone away. Taking mucinex.  Patient no longer smokes.  Patient states she was so sick with a respiratory illness she thought she had COVID   Has headaches every day. Takes topamax and has to take maxalt about every other day.  Occasionally the headaches wake her up at night no vomiting with them she describes as unilateral throbbing often spreads to both sides she feels that a lot of it is related to stress she is having to raise her grandson's kids because of the divorce  feet her feet at times feels numb life she has a hard time feeling that when she walks she does use a walking stick   Review of Systems See above    Objective:   Physical Exam  Lungs clear heart regular HEENT benign neurologic grossly normal      Assessment & Plan:  Residual URI gradually getting better no intervention necessary  1. Chronic migraine without aura without status migrainosus, not intractable Do a headache diary send that back to [place] within a month's time continue Topamax 10 mg twice daily avoid excessive use of Maxalt Patient could not afford the other medications that were tried for her 2. Hyperlipidemia LDL goal <130 Check lipid profile healthy diet regular activity - Lipid panel  3. High risk medication use Check lab work - Medical laboratory scientific officer - Hepatic function panel

## 2020-08-10 ENCOUNTER — Encounter: Payer: Self-pay | Admitting: Family Medicine

## 2020-08-10 ENCOUNTER — Other Ambulatory Visit: Payer: Self-pay

## 2020-08-10 ENCOUNTER — Ambulatory Visit (INDEPENDENT_AMBULATORY_CARE_PROVIDER_SITE_OTHER): Payer: Medicare Other | Admitting: Family Medicine

## 2020-08-10 VITALS — BP 119/65 | HR 65 | Temp 98.3°F | Ht 63.5 in | Wt 183.0 lb

## 2020-08-10 DIAGNOSIS — K130 Diseases of lips: Secondary | ICD-10-CM | POA: Diagnosis not present

## 2020-08-10 NOTE — Patient Instructions (Signed)
Hydrocortisone cream and aquaphor for 7 days.

## 2020-08-10 NOTE — Progress Notes (Signed)
Patient ID: [name], female    DOB: [birth year], 70 y.o.   MRN: [id number]   Chief Complaint  Patient presents with  . Oral Swelling   Subjective:    HPI  CC- lip swelling for past 3 days. This happened back in January or february and got sores in her mouth. Used magic mouthwash.   2nd time this is happened.  Very sick in 85 had uri and asthma. coughing and phelgm still.   Then started with mouth driness and lips and dry and then red about the lips.  Then had ulcers all over mouth inside and to the throat. And all over lips. Had prednisone and magic mouthwash.  Taking mucinex and  Nose has improved.  Not taking anything now. Last 4 days resolved in nose.  No sores or ulcers in mouth or lip.   Medical History Aryiah has a past medical history of Acid reflux, Allergy, Anxiety, Complete heart block ([place]) (09/2015), Dyspnea, Former tobacco use, Hyperlipidemia, Irritable bowel syndrome (IBS) (02/27/2017), Mast cell disease, Migraines, PAT (paroxysmal atrial tachycardia) ([place]), PONV (postoperative nausea and vomiting), Presence of permanent cardiac pacemaker (09/19/2015), Sinusitis, Sleep apnea, and Urticaria.   Outpatient Encounter Medications as of 08/10/2020  Medication Sig  . albuterol (PROVENTIL HFA;VENTOLIN HFA) 108 (90 Base) MCG/ACT inhaler Inhale 2 puffs into the lungs every 6 (six) hours as needed for wheezing or shortness of breath.  . Biotin 5000 MCG CAPS Take 5,000 mcg by mouth daily.  . bisacodyl (DULCOLAX) 5 MG EC tablet Take 10 mg by mouth daily as needed for mild constipation.   . Carboxymethylcellulose Sodium (THERATEARS OP) Place 5 drops into both eyes daily.  . fexofenadine (ALLEGRA) 30 MG tablet Take 30 mg by mouth daily.   . Multiple Vitamin (MULTIVITAMIN WITH MINERALS) TABS tablet Take 1 tablet by mouth daily.  Marland Kitchen omeprazole (PRILOSEC) 20 MG capsule Take 20 mg by mouth daily as needed.   Marland Kitchen OVER THE COUNTER MEDICATION B12 1000 mcg one daily  .  pantoprazole (PROTONIX) 40 MG tablet TAKE 1 TABLET BY MOUTH DAILY AS NEEDED FOR ACID REFLUX  . PSYLLIUM PO Take 3 tablets by mouth daily.  . rizatriptan (MAXALT) 10 MG tablet TAKE AS DIRECTED  . sertraline (ZOLOFT) 50 MG tablet TAKE 1 1/2 TABS (75 MG) DAILY  . topiramate (TOPAMAX) 100 MG tablet Take 1 tablet (100 mg total) by mouth 2 (two) times daily.  Marland Kitchen triamcinolone cream (KENALOG) 0.1 % Apply 1 application topically 2 (two) times daily. Prn rash; use up to 2 weeks  . vitamin C (ASCORBIC ACID) 500 MG tablet Take 500 mg by mouth daily.    No facility-administered encounter medications on file as of 08/10/2020.     Review of Systems  Constitutional: Negative for chills and fever.  HENT: Negative for congestion, rhinorrhea and sore throat.        +lip swelling.  Respiratory: Negative for cough, shortness of breath and wheezing.   Cardiovascular: Negative for chest pain and leg swelling.  Gastrointestinal: Negative for abdominal pain, diarrhea, nausea and vomiting.  Genitourinary: Negative for dysuria and frequency.  Musculoskeletal: Negative for arthralgias and back pain.  Skin: Negative for rash.  Neurological: Negative for dizziness, weakness and headaches.     Vitals BP 119/65   Pulse 65   Temp 98.3 F (36.8 C)   Ht 5' 3.5" (1.613 m)   Wt 183 lb (83 kg)   SpO2 97%   BMI 31.91 kg/m   Objective:  Physical Exam Vitals and nursing note reviewed.  Constitutional:      General: She is not in acute distress.    Appearance: Normal appearance.  HENT:     Nose: Nose normal. No congestion.     Mouth/Throat:     Mouth: Mucous membranes are moist.     Comments: +cracked lips, no scabbing, or bleeding.  No swelling to lip, tongue, or mouth. No oral sores or on lips. No cracking in creases of the lips. Eyes:     Extraocular Movements: Extraocular movements intact.     Conjunctiva/sclera: Conjunctivae normal.     Pupils: Pupils are equal, round, and reactive to light.   Skin:    Findings: No rash.  Neurological:     General: No focal deficit present.     Mental Status: She is alert and oriented to person, place, and time.      Assessment and Plan   1. Lip dryness   Mixture of hc cream bid and vaseline/aquaphor apply several times per day.  Drink lots of water.  Avoid licking lips. call or rto if worsening with swelling.   Return in about 1 week (around 08/17/2020) for recheck lip dermatitis/rash.

## 2020-08-24 ENCOUNTER — Encounter: Payer: Self-pay | Admitting: Family Medicine

## 2020-08-27 ENCOUNTER — Other Ambulatory Visit: Payer: Self-pay | Admitting: Family Medicine

## 2020-09-12 ENCOUNTER — Ambulatory Visit (INDEPENDENT_AMBULATORY_CARE_PROVIDER_SITE_OTHER): Payer: Medicare Other

## 2020-09-12 DIAGNOSIS — I442 Atrioventricular block, complete: Secondary | ICD-10-CM | POA: Diagnosis not present

## 2020-09-13 LAB — CUP PACEART REMOTE DEVICE CHECK
Battery Remaining Longevity: 129 mo
Battery Remaining Percentage: 95.5 %
Battery Voltage: 2.98 V
Brady Statistic AP VP Percent: 1 %
Brady Statistic AP VS Percent: 1 %
Brady Statistic AS VP Percent: 99 %
Brady Statistic AS VS Percent: 1 %
Brady Statistic RA Percent Paced: 1 %
Brady Statistic RV Percent Paced: 99 %
Date Time Interrogation Session: 20220412232425
Implantable Lead Implant Date: 20170418
Implantable Lead Implant Date: 20170418
Implantable Lead Location: 753859
Implantable Lead Location: 753860
Implantable Pulse Generator Implant Date: 20170418
Lead Channel Impedance Value: 490 Ohm
Lead Channel Impedance Value: 550 Ohm
Lead Channel Pacing Threshold Amplitude: 0.5 V
Lead Channel Pacing Threshold Amplitude: 0.75 V
Lead Channel Pacing Threshold Pulse Width: 0.5 ms
Lead Channel Pacing Threshold Pulse Width: 0.5 ms
Lead Channel Sensing Intrinsic Amplitude: 11.2 mV
Lead Channel Sensing Intrinsic Amplitude: 5 mV
Lead Channel Setting Pacing Amplitude: 1 V
Lead Channel Setting Pacing Amplitude: 2 V
Lead Channel Setting Pacing Pulse Width: 0.5 ms
Lead Channel Setting Sensing Sensitivity: 2 mV
Pulse Gen Model: 2272
Pulse Gen Serial Number: 7882213

## 2020-09-27 NOTE — Progress Notes (Signed)
Remote pacemaker transmission.   

## 2020-10-26 DIAGNOSIS — H0288B Meibomian gland dysfunction left eye, upper and lower eyelids: Secondary | ICD-10-CM | POA: Diagnosis not present

## 2020-10-26 DIAGNOSIS — H0288A Meibomian gland dysfunction right eye, upper and lower eyelids: Secondary | ICD-10-CM | POA: Diagnosis not present

## 2020-10-26 DIAGNOSIS — H1045 Other chronic allergic conjunctivitis: Secondary | ICD-10-CM | POA: Diagnosis not present

## 2020-11-27 ENCOUNTER — Telehealth: Payer: Self-pay | Admitting: *Deleted

## 2020-11-27 NOTE — Telephone Encounter (Signed)
Patient scheduled for Reclast infusion at [facility] day center 01/11/21. Patient needs labs completed within 30 days of infusion-(labs ordered in Epic)  Left message to return call to notify patient.(Form in box at nurses station)

## 2020-11-29 NOTE — Telephone Encounter (Signed)
Patient notified and will have labs drawn in mid July

## 2020-12-12 ENCOUNTER — Ambulatory Visit (INDEPENDENT_AMBULATORY_CARE_PROVIDER_SITE_OTHER): Payer: Medicare Other

## 2020-12-12 DIAGNOSIS — E785 Hyperlipidemia, unspecified: Secondary | ICD-10-CM | POA: Diagnosis not present

## 2020-12-12 DIAGNOSIS — I442 Atrioventricular block, complete: Secondary | ICD-10-CM | POA: Diagnosis not present

## 2020-12-12 DIAGNOSIS — Z79899 Other long term (current) drug therapy: Secondary | ICD-10-CM | POA: Diagnosis not present

## 2020-12-12 LAB — CUP PACEART REMOTE DEVICE CHECK
Battery Remaining Longevity: 58 mo
Battery Remaining Percentage: 47 %
Battery Voltage: 2.98 V
Brady Statistic AP VP Percent: 1 %
Brady Statistic AP VS Percent: 1 %
Brady Statistic AS VP Percent: 99 %
Brady Statistic AS VS Percent: 1 %
Brady Statistic RA Percent Paced: 1 %
Brady Statistic RV Percent Paced: 99 %
Date Time Interrogation Session: 20220712020016
Implantable Lead Implant Date: 20170418
Implantable Lead Implant Date: 20170418
Implantable Lead Location: 753859
Implantable Lead Location: 753860
Implantable Pulse Generator Implant Date: 20170418
Lead Channel Impedance Value: 490 Ohm
Lead Channel Impedance Value: 550 Ohm
Lead Channel Pacing Threshold Amplitude: 0.5 V
Lead Channel Pacing Threshold Amplitude: 1 V
Lead Channel Pacing Threshold Pulse Width: 0.5 ms
Lead Channel Pacing Threshold Pulse Width: 0.5 ms
Lead Channel Sensing Intrinsic Amplitude: 5 mV
Lead Channel Sensing Intrinsic Amplitude: 6.8 mV
Lead Channel Setting Pacing Amplitude: 1.25 V
Lead Channel Setting Pacing Amplitude: 2 V
Lead Channel Setting Pacing Pulse Width: 0.5 ms
Lead Channel Setting Sensing Sensitivity: 2 mV
Pulse Gen Model: 2272
Pulse Gen Serial Number: 7882213

## 2020-12-13 LAB — LIPID PANEL
Chol/HDL Ratio: 3.6 ratio (ref 0.0–4.4)
Cholesterol, Total: 245 mg/dL — ABNORMAL HIGH (ref 100–199)
HDL: 69 mg/dL (ref >39)
LDL Chol Calc (NIH): 153 mg/dL — ABNORMAL HIGH (ref 0–99)
Triglycerides: 133 mg/dL (ref 0–149)
VLDL Cholesterol Cal: 23 mg/dL (ref 5–40)

## 2020-12-13 LAB — BASIC METABOLIC PANEL
BUN/Creatinine Ratio: 19 (ref 12–28)
BUN: 16 mg/dL (ref 8–27)
CO2: 22 mmol/L (ref 20–29)
Calcium: 9.3 mg/dL (ref 8.7–10.3)
Chloride: 106 mmol/L (ref 96–106)
Creatinine, Ser: 0.85 mg/dL (ref 0.57–1.00)
Glucose: 98 mg/dL (ref 65–99)
Potassium: 4.5 mmol/L (ref 3.5–5.2)
Sodium: 141 mmol/L (ref 134–144)
eGFR: 75 mL/min/{1.73_m2} (ref >59)

## 2020-12-13 LAB — HEPATIC FUNCTION PANEL
ALT: 19 IU/L (ref 0–32)
AST: 21 IU/L (ref 0–40)
Albumin: 4.4 g/dL (ref 3.8–4.8)
Alkaline Phosphatase: 59 IU/L (ref 44–121)
Bilirubin Total: 0.3 mg/dL (ref 0.0–1.2)
Bilirubin, Direct: <0.1 mg/dL (ref 0.00–0.40)
Total Protein: 6.7 g/dL (ref 6.0–8.5)

## 2021-01-04 NOTE — Progress Notes (Signed)
Remote pacemaker transmission.   

## 2021-01-10 ENCOUNTER — Other Ambulatory Visit: Payer: Self-pay | Admitting: Family Medicine

## 2021-01-11 ENCOUNTER — Other Ambulatory Visit: Payer: Self-pay

## 2021-01-11 ENCOUNTER — Encounter (HOSPITAL_COMMUNITY)
Admission: RE | Admit: 2021-01-11 | Discharge: 2021-01-11 | Disposition: A | Discharge status: Home / Self Care | Payer: Medicare Other | Source: Ambulatory Visit | Attending: Family Medicine | Admitting: Family Medicine

## 2021-01-11 ENCOUNTER — Encounter (HOSPITAL_COMMUNITY): Payer: Self-pay

## 2021-01-11 DIAGNOSIS — M810 Age-related osteoporosis without current pathological fracture: Secondary | ICD-10-CM | POA: Insufficient documentation

## 2021-01-11 MED ORDER — ZOLEDRONIC ACID 5 MG/100ML IV SOLN
INTRAVENOUS | Status: AC
  Filled 2021-01-11: qty 100

## 2021-01-11 MED ORDER — SODIUM CHLORIDE 0.9 % IV SOLN: INTRAVENOUS | Status: DC

## 2021-01-11 MED ORDER — ZOLEDRONIC ACID 5 MG/100ML IV SOLN
5.0000 mg | Freq: Once | INTRAVENOUS | Status: AC
  Administered 2021-01-11: 5 mg via INTRAVENOUS

## 2021-01-16 ENCOUNTER — Other Ambulatory Visit: Payer: Self-pay | Admitting: Family Medicine

## 2021-02-14 ENCOUNTER — Telehealth: Payer: Self-pay | Admitting: Family Medicine

## 2021-02-14 NOTE — Telephone Encounter (Signed)
Left message for patient to call back and schedule Medicare Annual Wellness Visit (AWV) in office.   If unable to come into the office for AWV,  please offer to do virtually or by telephone.  Last AWV: 01/07/2020  Please schedule at anytime with [facility]-Nurse Health Advisor.  40 minute appointment  Any questions, please contact me at [phone]

## 2021-03-01 ENCOUNTER — Other Ambulatory Visit: Payer: Self-pay | Admitting: Family Medicine

## 2021-03-13 ENCOUNTER — Ambulatory Visit (INDEPENDENT_AMBULATORY_CARE_PROVIDER_SITE_OTHER): Payer: Medicare Other

## 2021-03-13 DIAGNOSIS — I442 Atrioventricular block, complete: Secondary | ICD-10-CM | POA: Diagnosis not present

## 2021-03-13 LAB — CUP PACEART REMOTE DEVICE CHECK
Battery Remaining Longevity: 54 mo
Battery Remaining Percentage: 44 %
Battery Voltage: 2.98 V
Brady Statistic AP VP Percent: 1 %
Brady Statistic AP VS Percent: 1 %
Brady Statistic AS VP Percent: 99 %
Brady Statistic AS VS Percent: 1 %
Brady Statistic RA Percent Paced: 1 %
Brady Statistic RV Percent Paced: 99 %
Date Time Interrogation Session: 20221011024454
Implantable Lead Implant Date: 20170418
Implantable Lead Implant Date: 20170418
Implantable Lead Location: 753859
Implantable Lead Location: 753860
Implantable Pulse Generator Implant Date: 20170418
Lead Channel Impedance Value: 480 Ohm
Lead Channel Impedance Value: 550 Ohm
Lead Channel Pacing Threshold Amplitude: 0.5 V
Lead Channel Pacing Threshold Amplitude: 1 V
Lead Channel Pacing Threshold Pulse Width: 0.5 ms
Lead Channel Pacing Threshold Pulse Width: 0.5 ms
Lead Channel Sensing Intrinsic Amplitude: 5 mV
Lead Channel Sensing Intrinsic Amplitude: 5.6 mV
Lead Channel Setting Pacing Amplitude: 1.25 V
Lead Channel Setting Pacing Amplitude: 2 V
Lead Channel Setting Pacing Pulse Width: 0.5 ms
Lead Channel Setting Sensing Sensitivity: 2 mV
Pulse Gen Model: 2272
Pulse Gen Serial Number: 7882213

## 2021-03-15 ENCOUNTER — Encounter: Payer: Self-pay | Admitting: Cardiology

## 2021-03-15 ENCOUNTER — Other Ambulatory Visit: Payer: Self-pay

## 2021-03-15 ENCOUNTER — Ambulatory Visit (INDEPENDENT_AMBULATORY_CARE_PROVIDER_SITE_OTHER): Payer: Medicare Other | Admitting: Cardiology

## 2021-03-15 VITALS — BP 128/80 | HR 71 | Ht 64.5 in | Wt 181.0 lb

## 2021-03-15 DIAGNOSIS — I442 Atrioventricular block, complete: Secondary | ICD-10-CM | POA: Diagnosis not present

## 2021-03-15 NOTE — Patient Instructions (Signed)
Medication Instructions:  Your physician recommends that you continue on your current medications as directed. Please refer to the Current Medication list given to you today.  *If you need a refill on your cardiac medications before your next appointment, please call your pharmacy*   Lab Work: None ordered   Testing/Procedures: None ordered   Follow-Up: At [facility], you and your health needs are our priority.  As part of our continuing mission to provide you with exceptional heart care, we have created designated Provider Care Teams.  These Care Teams include your primary Cardiologist (physician) and Advanced Practice Providers (APPs -  Physician Assistants and Nurse Practitioners) who all work together to provide you with the care you need, when you need it.  Remote monitoring is used to monitor your Pacemaker or ICD from home. This monitoring reduces the number of office visits required to check your device to one time per year. It allows [place] to keep an eye on the functioning of your device to ensure it is working properly. You are scheduled for a device check from home on 06/12/2021. You may send your transmission at any time that day. If you have a wireless device, the transmission will be sent automatically. After your physician reviews your transmission, you will receive a postcard with your next transmission date.  Your next appointment:   1 year(s)  The format for your next appointment:   In Person  Provider:   Allegra Lai, MD   Thank you for choosing [facility]!!   Trinidad Curet, RN [phone]

## 2021-03-15 NOTE — Progress Notes (Signed)
Electrophysiology Office Note   Date:  03/15/2021   ID:  [name], DOB 1951-01-25, MRN [id number]  PCP:  Kathyrn Drown, MD  Primary Electrophysiologist:  Constance Haw, MD    No chief complaint on file.    History of Present Illness: [name] is a 70 y.o. female who presents today for electrophysiology evaluation.     She has a history of complete heart block and hyperlipidemia.  She presented to the hospital after being found in complete heart block.  She is now status post Wimer dual-chamber pacemaker implanted 09/19/2015.  Today, denies symptoms of palpitations, chest pain, shortness of breath, orthopnea, PND, lower extremity edema, claudication, dizziness, presyncope, syncope, bleeding, or neurologic sequela. The patient is tolerating medications without difficulties.  She is currently feeling well.  She has no chest pain or shortness of breath.  She is able to do all of her daily activities.  She recently retired.  Unfortunately she retired because her granddaughter is in renal failure.  Her granddaughter is on the transplant list and she is potentially a Orthoptist and [name] potentially donate her kidney to her granddaughter.   Past Medical History:  Diagnosis Date   Acid reflux    Allergy    Anxiety    Complete heart block ([place]) 09/2015   a. s/p St. Jude PPM 09/2015.   Dyspnea    Former tobacco use    Hyperlipidemia    Irritable bowel syndrome (IBS) 02/27/2017   Mast cell disease    Migraines    Chronic   PAT (paroxysmal atrial tachycardia) (HCC)    PONV (postoperative nausea and vomiting)    Presence of permanent cardiac pacemaker 09/19/2015   Sinusitis    Sleep apnea    Urticaria    Past Surgical History:  Procedure Laterality Date   APPENDECTOMY     AUGMENTATION MAMMAPLASTY Bilateral    BIOPSY  06/17/2017   Procedure: BIOPSY;  Surgeon: Danie Binder, MD;  [facility]: [facility];  Service: Endoscopy;;  colon   BREAST ENHANCEMENT  SURGERY  1985   CHOLECYSTECTOMY     COLONOSCOPY     COLONOSCOPY WITH PROPOFOL N/A 06/17/2017   Dr. Oneida Alar: 30 mm polyp removed from the ascending colon piecemeal, tattooed, tubular adenoma.  8 mm polyp in the ascending colon, four 3 to 5 mm polyps removed from the rectum and transverse colon.  Tubular adenomas.  External and internal hemorrhoids.  Redundant colon.  Random colon biopsies negative.  Next colonoscopy in 3 years.   EP IMPLANTABLE DEVICE N/A 09/19/2015   Procedure: Pacemaker Implant;  Surgeon: Karen Kinnard Meredith Leeds, MD;  Location: [facility];  Service: Cardiovascular;  Laterality: N/A;   ESOPHAGOGASTRODUODENOSCOPY (EGD) WITH PROPOFOL N/A 06/17/2017   Dr. Oneida Alar: Medium sized hiatal hernia, mild erosive gastritis, small bowel biopsies negative for celiac.  Gastric biopsies with chronic mild inactive gastritis but no H. pylori.   INSERT / REPLACE / REMOVE PACEMAKER     POLYPECTOMY  06/17/2017   Procedure: POLYPECTOMY;  Surgeon: Danie Binder, MD;  Location: [facility];  Service: Endoscopy;;  colon   TONSILLECTOMY AND ADENOIDECTOMY     TUBAL LIGATION       Current Outpatient Medications  Medication Sig Dispense Refill   albuterol (PROVENTIL HFA;VENTOLIN HFA) 108 (90 Base) MCG/ACT inhaler Inhale 2 puffs into the lungs every 6 (six) hours as needed for wheezing or shortness of breath. 1 Inhaler 2   Biotin 5000 MCG CAPS  Take 5,000 mcg by mouth daily.     bisacodyl (DULCOLAX) 5 MG EC tablet Take 10 mg by mouth daily as needed for mild constipation.      Carboxymethylcellulose Sodium (THERATEARS OP) Place 5 drops into both eyes daily.     fexofenadine (ALLEGRA) 30 MG tablet Take 30 mg by mouth daily.      Multiple Vitamin (MULTIVITAMIN WITH MINERALS) TABS tablet Take 1 tablet by mouth daily.     omeprazole (PRILOSEC) 20 MG capsule Take 20 mg by mouth daily as needed.      OVER THE COUNTER MEDICATION B12 1000 mcg one daily     pantoprazole (PROTONIX) 40 MG tablet TAKE 1 TABLET BY  MOUTH DAILY AS NEEDED FOR ACID REFLUX 90 tablet 0   rizatriptan (MAXALT) 10 MG tablet TAKE AS DIRECTED 18 tablet 3   sertraline (ZOLOFT) 50 MG tablet TAKE 1 1/2 TABS (75 MG) DAILY 135 tablet 1   topiramate (TOPAMAX) 100 MG tablet TAKE 1 TABLET BY MOUTH TWICE A DAY 180 tablet 1   triamcinolone cream (KENALOG) 0.1 % Apply 1 application topically 2 (two) times daily. Prn rash; use up to 2 weeks 30 g 0   vitamin C (ASCORBIC ACID) 500 MG tablet Take 500 mg by mouth daily.      PSYLLIUM PO Take 3 tablets by mouth daily. (Patient not taking: Reported on 03/15/2021)     No current facility-administered medications for this visit.    Allergies:   Beta adrenergic blockers, Ambien [zolpidem tartrate], Biaxin [clarithromycin], and Fosamax [alendronate sodium]   Social History:  The patient  reports that she quit smoking about 16 years ago. Her smoking use included cigarettes. She has a 30.00 pack-year smoking history. She has never used smokeless tobacco. She reports that she does not drink alcohol and does not use drugs.   Family History:  The patient's family history includes Allergic rhinitis in her maternal grandmother; Asthma in her maternal grandmother and paternal grandmother; Cancer in her maternal grandfather; Congestive Heart Failure in her mother; Dementia in her mother; Diabetes in her maternal grandmother, mother, paternal grandmother, and sister; Heart attack in her paternal grandmother; Heart disease in her paternal grandmother; Lung cancer in her father; Parkinson's disease in her mother.   ROS:  Please see the history of present illness.   Otherwise, review of systems is positive for none.   All other systems are reviewed and negative.   PHYSICAL EXAM: VS:  BP 128/80   Pulse 71   Ht 5' 4.5" (1.638 m)   Wt 181 lb (82.1 kg)   SpO2 98%   BMI 30.59 kg/m  , BMI Body mass index is 30.59 kg/m. GEN: Well nourished, well developed, in no acute distress  HEENT: normal  Neck: no JVD, carotid  bruits, or masses Cardiac: RRR; no murmurs, rubs, or gallops,no edema  Respiratory:  clear to auscultation bilaterally, normal work of breathing GI: soft, nontender, nondistended, + BS MS: no deformity or atrophy  Skin: warm and dry, device site well healed Neuro:  Strength and sensation are intact Psych: euthymic mood, full affect  EKG:  EKG is ordered today. Personal review of the ekg ordered shows atrial sensed, ventricular paced  Personal review of the device interrogation today. Results in [place]: 12/12/2020: ALT 19; BUN 16; Creatinine, Ser 0.85; Potassium 4.5; Sodium 141    Lipid Panel     Component Value Date/Time   CHOL 245 (H) 12/12/2020 0913   TRIG 133  12/12/2020 0913   HDL 69 12/12/2020 0913   CHOLHDL 3.6 12/12/2020 0913   CHOLHDL 2.4 01/19/2014 0918   VLDL 13 01/19/2014 0918   LDLCALC 153 (H) 12/12/2020 0913     Wt Readings from Last 3 Encounters:  03/15/21 181 lb (82.1 kg)  01/11/21 182 lb 15.7 oz (83 kg)  08/10/20 183 lb (83 kg)      Other studies Reviewed: Additional studies/ records that were reviewed today include: TTE 01/23/16 - Left ventricle: The cavity size was normal. Wall thickness was   normal. Systolic function was normal. The estimated ejection   fraction was in the range of 55% to 60%. Wall motion was normal;   there were no regional wall motion abnormalities. Doppler   parameters are consistent with abnormal left ventricular   relaxation (grade 1 diastolic dysfunction). - Aortic valve: Trileaflet; mildly calcified leaflets. - Right ventricle: Pacer wire or catheter noted in right ventricle. - Right atrium: Central venous pressure (est): 3 mm Hg. - Tricuspid valve: There was trivial regurgitation. - Pulmonary arteries: PA peak pressure: 20 mm Hg (S). - Pericardium, extracardiac: There was no pericardial effusion.  Holter 02/12/16 Sinus rhythm 1 degree AV block Minimum HR: 50 BPM at 12:59:11 AM Maximum HR: 118 BPM at  8:00:47 AM Average HR: 74 BPM.  ASSESSMENT AND PLAN:  1.  Complete heart block: Status post Saint Jude dual-chamber pacemaker implanted 09/19/2018.  Device functioning appropriately.  No changes at this time.  2.  Hyperlipidemia: Continue atorvastatin per primary physician.  Current medicines are reviewed at length with the patient today.   The patient does not have concerns regarding her medicines.  The following changes were made today: None  Labs/ tests ordered today include:  Orders Placed This Encounter  Procedures   EKG 12-Lead      Disposition:   FU with Quashawn Jewkes 12 months  Signed, Terris Germano Meredith Leeds, MD  03/15/2021 2:31 PM     [place] [address] [place] [address] [phone] (office) [phone] (fax)

## 2021-03-16 ENCOUNTER — Other Ambulatory Visit: Payer: Self-pay | Admitting: Family Medicine

## 2021-03-22 NOTE — Progress Notes (Signed)
Remote pacemaker transmission.   

## 2021-04-13 ENCOUNTER — Telehealth: Payer: Self-pay | Admitting: Family Medicine

## 2021-04-13 NOTE — Telephone Encounter (Signed)
Left message for patient to call back and schedule Medicare Annual Wellness Visit (AWV) in office.   If unable to come into the office for AWV,  please offer to do virtually or by telephone.  Last AWV: 01/07/2020  Please schedule at anytime with [facility]-Nurse Health Advisor.  40 minute appointment  Any questions, please contact me at [phone]

## 2021-06-03 ENCOUNTER — Other Ambulatory Visit: Payer: Self-pay | Admitting: Family Medicine

## 2021-06-08 ENCOUNTER — Ambulatory Visit (INDEPENDENT_AMBULATORY_CARE_PROVIDER_SITE_OTHER): Payer: Medicare Other | Admitting: Nurse Practitioner

## 2021-06-08 ENCOUNTER — Other Ambulatory Visit: Payer: Self-pay

## 2021-06-08 ENCOUNTER — Encounter: Payer: Self-pay | Admitting: Nurse Practitioner

## 2021-06-08 VITALS — BP 118/82 | HR 72 | Temp 96.8°F | Ht 64.5 in | Wt 182.0 lb

## 2021-06-08 DIAGNOSIS — F418 Other specified anxiety disorders: Secondary | ICD-10-CM | POA: Diagnosis not present

## 2021-06-08 DIAGNOSIS — M62838 Other muscle spasm: Secondary | ICD-10-CM

## 2021-06-08 DIAGNOSIS — B9689 Other specified bacterial agents as the cause of diseases classified elsewhere: Secondary | ICD-10-CM | POA: Diagnosis not present

## 2021-06-08 DIAGNOSIS — J069 Acute upper respiratory infection, unspecified: Secondary | ICD-10-CM

## 2021-06-08 DIAGNOSIS — G609 Hereditary and idiopathic neuropathy, unspecified: Secondary | ICD-10-CM | POA: Diagnosis not present

## 2021-06-08 MED ORDER — CYCLOBENZAPRINE HCL 10 MG PO TABS: ORAL_TABLET | ORAL | 0 refills | Status: DC

## 2021-06-08 MED ORDER — DULOXETINE HCL 30 MG PO CPEP: 30.0000 mg | ORAL_CAPSULE | Freq: Every day | ORAL | 2 refills | Status: DC

## 2021-06-08 MED ORDER — AMOXICILLIN-POT CLAVULANATE 875-125 MG PO TABS: 1.0000 | ORAL_TABLET | Freq: Two times a day (BID) | ORAL | 0 refills | Status: DC

## 2021-06-08 NOTE — Patient Instructions (Signed)
Stop Sertraline and start Duloxetine tomorrow.

## 2021-06-08 NOTE — Progress Notes (Signed)
Subjective:    Patient ID: [name], female    DOB: [birth year], 71 y.o.   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  States she had the flu at that time.  Over the past several weeks continues to have sore throat bilateral ear pressure.  Minimal nonproductive cough. frontal area headache.  No further fever.  Has tried several OTC products with no relief. In addition patient complains of dull headaches and neck pain.  Has been under tremendous amounts of stress lately due to several family issues including her husband being critically ill recently.  Is a caregiver for several people.  Having difficulty sleeping at night due to neck pain and stiffness. Mentions that she has chronic neuropathy of both feet which is gotten slightly worse lately.  Has been taking OTC B12 supplement.  Could not take gabapentin due to extreme drowsiness during the day.  Review of Systems  HENT:  Positive for ear pain, postnasal drip, sinus pressure, sinus pain and sore throat.   Respiratory:  Negative for cough, chest tightness, shortness of breath and wheezing.   Cardiovascular:  Negative for chest pain.  Neurological:  Positive for headaches.      Objective:   Physical Exam NAD.  Alert, oriented.  Right TM mild clear effusion.  Left TM retracted, no erythema.  Nasal exam slight deviation to the left.  Mucosa clear.  Pharynx minimally injected with greenish PND noted.  Neck supple with mild soft anterior adenopathy.  Lungs clear.  Heart regular rate and rhythm.  Normal ROM of the neck.  Extremely tight tender muscles noted along the lateral neck area bilaterally into the trapezius and upper  rhomboids.  Today's Vitals   06/08/21 1617  BP: 118/82  Pulse: 72  Temp: (!) 96.8 F (36 C)  SpO2: 99%  Weight: 182 lb (82.6 kg)  Height: 5' 4.5" (1.638 m)   Body mass index is 30.76 kg/m.        Assessment & Plan:   Problem List Items Addressed This Visit       Nervous and Auditory   Idiopathic peripheral neuropathy   Relevant Medications   cyclobenzaprine (FLEXERIL) 10 MG tablet   DULoxetine (CYMBALTA) 30 MG capsule     Other   Depression with anxiety   Relevant Medications   DULoxetine (CYMBALTA) 30 MG capsule   Other Visit Diagnoses     Bacterial URI    -  Primary   Muscle spasms of neck          Meds ordered this encounter  Medications   amoxicillin-clavulanate (AUGMENTIN) 875-125 MG tablet    Sig: Take 1 tablet by mouth 2 (two) times daily.    Dispense:  20 tablet    Refill:  0    Order Specific Question:   Supervising Provider    Answer:   [name] A [[id number]]   cyclobenzaprine (FLEXERIL) 10 MG tablet    Sig: Take 1/2 - 1 tab po qhs prn muscle spasms    Dispense:  30 tablet    Refill:  0    Order Specific Question:   Supervising Provider    Answer:   [name]  A [[id number]]   DULoxetine (CYMBALTA) 30 MG capsule    Sig: Take 1 capsule (30 mg total) by mouth daily. This takes the place of your Sertraline    Dispense:  30 capsule    Refill:  2    Order Specific Question:   Supervising Provider    Answer:   [name] A [[id number]]   Continue OTC steroid nasal spray and antihistamine as directed.  Start Augmentin as directed. Recommend ice/heat applications stretching exercises and Flexeril at nighttime as directed for muscle spasms/tightness.  Discussed importance of stress reduction and self-care. Stop Zoloft.  Switch to duloxetine to help both anxiety/depression and peripheral neuropathy symptoms.  Call back if any problems.  Discussed potential adverse effects. Call back if any symptoms worsen or persist. Return in about 3 months (around  09/06/2021).

## 2021-06-09 ENCOUNTER — Encounter: Payer: Self-pay | Admitting: Nurse Practitioner

## 2021-06-12 ENCOUNTER — Ambulatory Visit (INDEPENDENT_AMBULATORY_CARE_PROVIDER_SITE_OTHER): Payer: Medicare Other

## 2021-06-12 DIAGNOSIS — I442 Atrioventricular block, complete: Secondary | ICD-10-CM

## 2021-06-12 LAB — CUP PACEART REMOTE DEVICE CHECK
Battery Remaining Longevity: 52 mo
Battery Remaining Percentage: 42 %
Battery Voltage: 2.98 V
Brady Statistic AP VP Percent: 1 %
Brady Statistic AP VS Percent: 1 %
Brady Statistic AS VP Percent: 99 %
Brady Statistic AS VS Percent: 1 %
Brady Statistic RA Percent Paced: 1 %
Brady Statistic RV Percent Paced: 99 %
Date Time Interrogation Session: 20230110020013
Implantable Lead Implant Date: 20170418
Implantable Lead Implant Date: 20170418
Implantable Lead Location: 753859
Implantable Lead Location: 753860
Implantable Pulse Generator Implant Date: 20170418
Lead Channel Impedance Value: 490 Ohm
Lead Channel Impedance Value: 550 Ohm
Lead Channel Pacing Threshold Amplitude: 0.5 V
Lead Channel Pacing Threshold Amplitude: 1.125 V
Lead Channel Pacing Threshold Pulse Width: 0.5 ms
Lead Channel Pacing Threshold Pulse Width: 0.5 ms
Lead Channel Sensing Intrinsic Amplitude: 5 mV
Lead Channel Sensing Intrinsic Amplitude: 5.6 mV
Lead Channel Setting Pacing Amplitude: 1.375
Lead Channel Setting Pacing Amplitude: 2 V
Lead Channel Setting Pacing Pulse Width: 0.5 ms
Lead Channel Setting Sensing Sensitivity: 2 mV
Pulse Gen Model: 2272
Pulse Gen Serial Number: 7882213

## 2021-06-22 NOTE — Progress Notes (Signed)
Remote pacemaker transmission.   

## 2021-06-25 ENCOUNTER — Other Ambulatory Visit: Payer: Self-pay | Admitting: Family Medicine

## 2021-07-01 ENCOUNTER — Other Ambulatory Visit: Payer: Self-pay | Admitting: Nurse Practitioner

## 2021-07-05 ENCOUNTER — Other Ambulatory Visit: Payer: Self-pay | Admitting: Nurse Practitioner

## 2021-07-10 ENCOUNTER — Other Ambulatory Visit: Payer: Self-pay | Admitting: Family Medicine

## 2021-08-04 ENCOUNTER — Other Ambulatory Visit: Payer: Self-pay | Admitting: Nurse Practitioner

## 2021-09-07 ENCOUNTER — Ambulatory Visit: Payer: Self-pay | Admitting: Nurse Practitioner

## 2021-09-11 ENCOUNTER — Ambulatory Visit (INDEPENDENT_AMBULATORY_CARE_PROVIDER_SITE_OTHER): Payer: Medicare Other

## 2021-09-11 VITALS — Ht 64.5 in | Wt 182.0 lb

## 2021-09-11 DIAGNOSIS — Z0000 Encounter for general adult medical examination without abnormal findings: Secondary | ICD-10-CM

## 2021-09-11 DIAGNOSIS — I442 Atrioventricular block, complete: Secondary | ICD-10-CM

## 2021-09-11 NOTE — Patient Instructions (Signed)
[name] , ?Thank you for taking time to come for your Medicare Wellness Visit. I appreciate your ongoing commitment to your health goals. Please review the following plan we discussed and let me know if I can assist you in the future.  ? ?Screening recommendations/referrals: ?Colonoscopy: Done 06/17/2017 Repeat in 5 years ? ?Mammogram: Done 02/11/2020. Repeat annually ? ?Bone Density: Done 02/11/2020. Repeat every 2 years ? ?Recommended yearly ophthalmology/optometry visit for glaucoma screening and checkup ?Recommended yearly dental visit for hygiene and checkup ? ?Vaccinations: ?Influenza vaccine: Repeat annually ? ?Pneumococcal vaccine: Done 04/01/2018 and 01/07/2020. ?Tdap vaccine: Done 10/02/2011 Repeat in 10 years ? ?Shingles vaccine: Done 12/03/2019, 06/04/2019 and 07/05/2018   ?Covid-19:Done 07/13/2019, 06/22/2019. ? ?Advanced directives: Please bring a copy of your health care power of attorney and living will to the office to be added to your chart at your convenience. ? ? ?Conditions/risks identified: Aim for 30 minutes of exercise or brisk walking, 6-8 glasses of water, and 5 servings of fruits and vegetables each day. ?KEEP UP THE GOOD WORK!! ? ?Next appointment: Follow up in one year for your annual wellness visit 2024. ? ? ?Preventive Care 67 Years and Older, Female ?Preventive care refers to lifestyle choices and visits with your health care provider that can promote health and wellness. ?What does preventive care include? ?A yearly physical exam. This is also called an annual well check. ?Dental exams once or twice a year. ?Routine eye exams. Ask your health care provider how often you should have your eyes checked. ?Personal lifestyle choices, including: ?Daily care of your teeth and gums. ?Regular physical activity. ?Eating a healthy diet. ?Avoiding tobacco and drug use. ?Limiting alcohol use. ?Practicing safe sex. ?Taking low-dose aspirin every day. ?Taking vitamin and mineral supplements as recommended by  your health care provider. ?What happens during an annual well check? ?The services and screenings done by your health care provider during your annual well check will depend on your age, overall health, lifestyle risk factors, and family history of disease. ?Counseling  ?Your health care provider may ask you questions about your: ?Alcohol use. ?Tobacco use. ?Drug use. ?Emotional well-being. ?Home and relationship well-being. ?Sexual activity. ?Eating habits. ?History of falls. ?Memory and ability to understand (cognition). ?Work and work Statistician. ?Reproductive health. ?Screening  ?You may have the following tests or measurements: ?Height, weight, and BMI. ?Blood pressure. ?Lipid and cholesterol levels. These may be checked every 5 years, or more frequently if you are over 2 years old. ?Skin check. ?Lung cancer screening. You may have this screening every year starting at age 25 if you have a 30-pack-year history of smoking and currently smoke or have quit within the past 15 years. ?Fecal occult blood test (FOBT) of the stool. You may have this test every year starting at age 30. ?Flexible sigmoidoscopy or colonoscopy. You may have a sigmoidoscopy every 5 years or a colonoscopy every 10 years starting at age 21. ?Hepatitis C blood test. ?Hepatitis B blood test. ?Sexually transmitted disease (STD) testing. ?Diabetes screening. This is done by checking your blood sugar (glucose) after you have not eaten for a while (fasting). You may have this done every 1-3 years. ?Bone density scan. This is done to screen for osteoporosis. You may have this done starting at age 74. ?Mammogram. This may be done every 1-2 years. Talk to your health care provider about how often you should have regular mammograms. ?Talk with your health care provider about your test results, treatment options, and if necessary,  the need for more tests. ?Vaccines  ?Your health care provider may recommend certain vaccines, such as: ?Influenza  vaccine. This is recommended every year. ?Tetanus, diphtheria, and acellular pertussis (Tdap, Td) vaccine. You may need a Td booster every 10 years. ?Zoster vaccine. You may need this after age 24. ?Pneumococcal 13-valent conjugate (PCV13) vaccine. One dose is recommended after age 7. ?Pneumococcal polysaccharide (PPSV23) vaccine. One dose is recommended after age 1. ?Talk to your health care provider about which screenings and vaccines you need and how often you need them. ?This information is not intended to replace advice given to you by your health care provider. Make sure you discuss any questions you have with your health care provider. ?Document Released: 06/16/2015 Document Revised: 02/07/2016 Document Reviewed: 03/21/2015 ?Elsevier Interactive Patient Education ? 2017 [employer]. ? ?Fall Prevention in the Home ?Falls can cause injuries. They can happen to people of all ages. There are many things you can do to make your home safe and to help prevent falls. ?What can I do on the outside of my home? ?Regularly fix the edges of walkways and driveways and fix any cracks. ?Remove anything that might make you trip as you walk through a door, such as a raised step or threshold. ?Trim any bushes or trees on the path to your home. ?Use bright outdoor lighting. ?Clear any walking paths of anything that might make someone trip, such as rocks or tools. ?Regularly check to see if handrails are loose or broken. Make sure that both sides of any steps have handrails. ?Any raised decks and porches should have guardrails on the edges. ?Have any leaves, snow, or ice cleared regularly. ?Use sand or salt on walking paths during winter. ?Clean up any spills in your garage right away. This includes oil or grease spills. ?What can I do in the bathroom? ?Use night lights. ?Install grab bars by the toilet and in the tub and shower. Do not use towel bars as grab bars. ?Use non-skid mats or decals in the tub or shower. ?If you  need to sit down in the shower, use a plastic, non-slip stool. ?Keep the floor dry. Clean up any water that spills on the floor as soon as it happens. ?Remove soap buildup in the tub or shower regularly. ?Attach bath mats securely with double-sided non-slip rug tape. ?Do not have throw rugs and other things on the floor that can make you trip. ?What can I do in the bedroom? ?Use night lights. ?Make sure that you have a light by your bed that is easy to reach. ?Do not use any sheets or blankets that are too big for your bed. They should not hang down onto the floor. ?Have a firm chair that has side arms. You can use this for support while you get dressed. ?Do not have throw rugs and other things on the floor that can make you trip. ?What can I do in the kitchen? ?Clean up any spills right away. ?Avoid walking on wet floors. ?Keep items that you use a lot in easy-to-reach places. ?If you need to reach something above you, use a strong step stool that has a grab bar. ?Keep electrical cords out of the way. ?Do not use floor polish or wax that makes floors slippery. If you must use wax, use non-skid floor wax. ?Do not have throw rugs and other things on the floor that can make you trip. ?What can I do with my stairs? ?Do not leave any items on  the stairs. ?Make sure that there are handrails on both sides of the stairs and use them. Fix handrails that are broken or loose. Make sure that handrails are as long as the stairways. ?Check any carpeting to make sure that it is firmly attached to the stairs. Fix any carpet that is loose or worn. ?Avoid having throw rugs at the top or bottom of the stairs. If you do have throw rugs, attach them to the floor with carpet tape. ?Make sure that you have a light switch at the top of the stairs and the bottom of the stairs. If you do not have them, ask someone to add them for you. ?What else can I do to help prevent falls? ?Wear shoes that: ?Do not have high heels. ?Have rubber  bottoms. ?Are comfortable and fit you well. ?Are closed at the toe. Do not wear sandals. ?If you use a stepladder: ?Make sure that it is fully opened. Do not climb a closed stepladder. ?Make sure that both sides of t

## 2021-09-11 NOTE — Progress Notes (Signed)
? ?Subjective:  ? [name] is a 71 y.o. female who presents for Medicare Annual (Subsequent) preventive examination. ?Virtual Visit via Telephone Note ? ?I connected with  [name] on 09/11/21 at  3:00 PM EDT by telephone and verified that I am speaking with the correct person using two identifiers. ? ?Location: ?Patient: HOME ?Provider: [facility] ?Persons participating in the virtual visit: patient/Nurse Health Advisor ?  ?I discussed the limitations, risks, security and privacy concerns of performing an evaluation and management service by telephone and the availability of in person appointments. The patient expressed understanding and agreed to proceed. ? ?Interactive audio and video telecommunications were attempted between this nurse and patient, however failed, due to patient having technical difficulties OR patient did not have access to video capability.  We continued and completed visit with audio only. ? ?Some vital signs may be absent or patient reported.  ? ?[name], LPN ? ?Review of Systems    ? ?Cardiac Risk Factors include: advanced age (>29mn, >>80women);sedentary lifestyle;obesity (BMI >30kg/m2);Other (see comment), Risk factor comments: Atrial Tachychardia, Neuropathy ? ?   ?Objective:  ?  ?Today's Vitals  ? 09/11/21 1459  ?Weight: 182 lb (82.6 kg)  ?Height: 5' 4.5" (1.638 m)  ? ?Body mass index is 30.76 kg/m?. ? ? ?  09/11/2021  ?  3:14 PM 06/13/2017  ?  9:15 AM 10/28/2016  ? 10:33 AM 09/04/2016  ?  9:18 AM 08/19/2016  ?  1:45 PM 01/12/2016  ?  9:48 AM 09/23/2015  ?  1:57 PM  ?Advanced Directives  ?Does Patient Have a Medical Advance Directive? Yes No No No No No No  ?Type of [place]        ?Copy of [place]? No - copy requested        ?Would patient like information on creating a medical advance directive?  No - Patient declined  No - Patient declined No - Patient declined No - patient declined  information   ? ? ?Current Medications (verified) ?Outpatient Encounter Medications as of 09/11/2021  ?Medication Sig  ? amoxicillin-clavulanate (AUGMENTIN) 875-125 MG tablet Take 1 tablet by mouth 2 (two) times daily.  ? Biotin 5000 MCG CAPS Take 5,000 mcg by mouth daily.  ? Carboxymethylcellulose Sodium (THERATEARS OP) Place 5 drops into both eyes daily.  ? cyclobenzaprine (FLEXERIL) 10 MG tablet TAKE 1/2 - 1 TAB BY MOUTH AT BEDTIME AS NEEDED FOR MUSCLE SPASMS  ? fexofenadine (ALLEGRA) 30 MG tablet Take 30 mg by mouth daily.   ? Multiple Vitamin (MULTIVITAMIN WITH MINERALS) TABS tablet Take 1 tablet by mouth daily.  ? OVER THE COUNTER MEDICATION B12 1000 mcg one daily  ? pantoprazole (PROTONIX) 40 MG tablet TAKE 1 TABLET BY MOUTH DAILY AS NEEDED FOR ACID REFLUX  ? PSYLLIUM PO Take 3 tablets by mouth daily.  ? rizatriptan (MAXALT) 10 MG tablet TAKE AS DIRECTED  ? topiramate (TOPAMAX) 100 MG tablet TAKE 1 TABLET BY MOUTH TWICE A DAY  ? vitamin C (ASCORBIC ACID) 500 MG tablet Take 500 mg by mouth daily.   ? [DISCONTINUED] DULoxetine (CYMBALTA) 30 MG capsule TAKE 1 CAPSULE (30 MG TOTAL) BY MOUTH DAILY. THIS TAKES THE PLACE OF YOUR SERTRALINE (Patient not taking: Reported on 09/11/2021)  ? ?No facility-administered encounter medications on file as of 09/11/2021.  ? ? ?Allergies (verified) ?Beta adrenergic blockers, Ambien [zolpidem tartrate], Biaxin [clarithromycin], Duloxetine hcl, and Fosamax [alendronate sodium]  ? ?  History: ?Past Medical History:  ?Diagnosis Date  ? Acid reflux   ? Allergy   ? Anxiety   ? Complete heart block ([place]) 09/2015  ? a. s/p [employer] PPM 09/2015.  ? Dyspnea   ? Former tobacco use   ? Hyperlipidemia   ? Irritable bowel syndrome (IBS) 02/27/2017  ? Mast cell disease   ? Migraines   ? Chronic  ? PAT (paroxysmal atrial tachycardia) ([place])   ? PONV (postoperative nausea and vomiting)   ? Presence of permanent cardiac pacemaker 09/19/2015  ? Sinusitis   ? Sleep apnea   ? Urticaria   ? ?Past Surgical  History:  ?Procedure Laterality Date  ? APPENDECTOMY    ? AUGMENTATION MAMMAPLASTY Bilateral   ? BIOPSY  06/17/2017  ? Procedure: BIOPSY;  Surgeon: Danie Binder, MD;  [facility];  Service: Endoscopy;;  colon  ? [place]  ? CHOLECYSTECTOMY    ? COLONOSCOPY    ? COLONOSCOPY WITH PROPOFOL N/A 06/17/2017  ? Dr. Oneida Alar: 30 mm polyp removed from the ascending colon piecemeal, tattooed, tubular adenoma.  8 mm polyp in the ascending colon, four 3 to 5 mm polyps removed from the rectum and transverse colon.  Tubular adenomas.  External and internal hemorrhoids.  Redundant colon.  Random colon biopsies negative.  Next colonoscopy in 3 years.  ? EP IMPLANTABLE DEVICE N/A 09/19/2015  ? Procedure: Pacemaker Implant;  Surgeon: Will Meredith Leeds, MD;  Location: [facility];  Service: Cardiovascular;  Laterality: N/A;  ? ESOPHAGOGASTRODUODENOSCOPY (EGD) WITH PROPOFOL N/A 06/17/2017  ? Dr. Oneida Alar: Medium sized hiatal hernia, mild erosive gastritis, small bowel biopsies negative for celiac.  Gastric biopsies with chronic mild inactive gastritis but no H. pylori.  ? INSERT / REPLACE / REMOVE PACEMAKER    ? POLYPECTOMY  06/17/2017  ? Procedure: POLYPECTOMY;  Surgeon: Danie Binder, MD;  [facility];  Service: Endoscopy;;  colon  ? TONSILLECTOMY AND ADENOIDECTOMY    ? TUBAL LIGATION    ? ?Family History  ?Problem Relation Age of Onset  ? Congestive Heart Failure Mother   ? Diabetes Mother   ? Dementia Mother   ? Parkinson's disease Mother   ? Lung cancer Father   ? Diabetes Sister   ? Diabetes Maternal Grandmother   ? Allergic rhinitis Maternal Grandmother   ? Asthma Maternal Grandmother   ? Cancer Maternal Grandfather   ? Diabetes Paternal Grandmother   ? Heart attack Paternal Grandmother   ? Heart disease Paternal Grandmother   ? Asthma Paternal Grandmother   ? Angioedema Neg Hx   ? Eczema Neg Hx   ? Urticaria Neg Hx   ? ?Social History  ? ?Socioeconomic History  ? Marital  status: Married  ?  Spouse name: [name]  ? Number of children: Not on file  ? Years of education: Not on file  ? Highest education level: Not on file  ?Occupational History  ? Not on file  ?Tobacco Use  ? Smoking status: Former  ?  Packs/day: 1.50  ?  Years: 20.00  ?  Pack years: 30.00  ?  Types: Cigarettes  ?  Quit date: 06/03/2004  ?  Years since quitting: 17.2  ? Smokeless tobacco: Never  ? Tobacco comments:  ?  quit x 11 yrs.  ?Vaping Use  ? Vaping Use: Never used  ?Substance and Sexual Activity  ? Alcohol use: No  ? Drug use: No  ? Sexual  activity: Yes  ?  Partners: Male  ?  Birth control/protection: Surgical  ?Other Topics Concern  ? Not on file  ?Social History Narrative  ? Married.  ? Cares for great grandchildren during week.   ? ?Social Determinants of Health  ? ?Financial Resource Strain: Low Risk   ? Difficulty of Paying Living Expenses: Not hard at all  ?Food Insecurity: No Food Insecurity  ? Worried About Charity fundraiser in the Last Year: Never true  ? Ran Out of Food in the Last Year: Never true  ?Transportation Needs: No Transportation Needs  ? Lack of Transportation (Medical): No  ? Lack of Transportation (Non-Medical): No  ?Physical Activity: Sufficiently Active  ? Days of Exercise per Week: 5 days  ? Minutes of Exercise per Session: 30 min  ?Stress: No Stress Concern Present  ? Feeling of Stress : Not at all  ?Social Connections: Socially Integrated  ? Frequency of Communication with Friends and Family: More than three times a week  ? Frequency of Social Gatherings with Friends and Family: More than three times a week  ? Attends Religious Services: More than 4 times per year  ? Active Member of Clubs or Organizations: Yes  ? Attends Archivist Meetings: More than 4 times per year  ? Marital Status: Married  ? ? ?Tobacco Counseling ?Counseling given: Not Answered ?Tobacco comments: quit x 11 yrs. ? ? ?Clinical Intake: ? ?Pre-visit preparation completed: Yes ? ?Pain : No/denies pain ? ?   ? ?BMI - recorded: 30.76 ?Nutritional Status: BMI > 30  Obese ?Nutritional Risks: None ?Diabetes: No ? ?How often do you need to have someone help you when you read instructions, pamphlets, or other written mater

## 2021-09-12 LAB — CUP PACEART REMOTE DEVICE CHECK
Battery Remaining Longevity: 49 mo
Battery Remaining Percentage: 39 %
Battery Voltage: 2.96 V
Brady Statistic AP VP Percent: 1 %
Brady Statistic AP VS Percent: 1 %
Brady Statistic AS VP Percent: 99 %
Brady Statistic AS VS Percent: 1 %
Brady Statistic RA Percent Paced: 1 %
Brady Statistic RV Percent Paced: 99 %
Date Time Interrogation Session: 20230411181254
Implantable Lead Implant Date: 20170418
Implantable Lead Implant Date: 20170418
Implantable Lead Location: 753859
Implantable Lead Location: 753860
Implantable Pulse Generator Implant Date: 20170418
Lead Channel Impedance Value: 510 Ohm
Lead Channel Impedance Value: 550 Ohm
Lead Channel Pacing Threshold Amplitude: 0.5 V
Lead Channel Pacing Threshold Amplitude: 0.875 V
Lead Channel Pacing Threshold Pulse Width: 0.5 ms
Lead Channel Pacing Threshold Pulse Width: 0.5 ms
Lead Channel Sensing Intrinsic Amplitude: 5 mV
Lead Channel Sensing Intrinsic Amplitude: 5.6 mV
Lead Channel Setting Pacing Amplitude: 1.125
Lead Channel Setting Pacing Amplitude: 2 V
Lead Channel Setting Pacing Pulse Width: 0.5 ms
Lead Channel Setting Sensing Sensitivity: 2 mV
Pulse Gen Model: 2272
Pulse Gen Serial Number: 7882213

## 2021-09-28 ENCOUNTER — Ambulatory Visit (INDEPENDENT_AMBULATORY_CARE_PROVIDER_SITE_OTHER): Payer: Medicare Other | Admitting: Nurse Practitioner

## 2021-09-28 ENCOUNTER — Encounter: Payer: Self-pay | Admitting: Nurse Practitioner

## 2021-09-28 VITALS — BP 130/74 | HR 89 | Temp 94.5°F | Wt 188.6 lb

## 2021-09-28 DIAGNOSIS — F418 Other specified anxiety disorders: Secondary | ICD-10-CM

## 2021-09-28 DIAGNOSIS — J310 Chronic rhinitis: Secondary | ICD-10-CM

## 2021-09-28 DIAGNOSIS — B9689 Other specified bacterial agents as the cause of diseases classified elsewhere: Secondary | ICD-10-CM

## 2021-09-28 DIAGNOSIS — J019 Acute sinusitis, unspecified: Secondary | ICD-10-CM | POA: Diagnosis not present

## 2021-09-28 DIAGNOSIS — G609 Hereditary and idiopathic neuropathy, unspecified: Secondary | ICD-10-CM | POA: Diagnosis not present

## 2021-09-28 DIAGNOSIS — J4520 Mild intermittent asthma, uncomplicated: Secondary | ICD-10-CM | POA: Diagnosis not present

## 2021-09-28 MED ORDER — SERTRALINE HCL 50 MG PO TABS: ORAL_TABLET | ORAL | 2 refills | Status: DC

## 2021-09-28 MED ORDER — TRIAMCINOLONE ACETONIDE 0.1 % EX CREA: 1.0000 "application " | TOPICAL_CREAM | Freq: Two times a day (BID) | CUTANEOUS | 0 refills | Status: DC

## 2021-09-28 MED ORDER — MONTELUKAST SODIUM 10 MG PO TABS: 10.0000 mg | ORAL_TABLET | Freq: Every day | ORAL | 2 refills | Status: DC

## 2021-09-28 MED ORDER — LEVOFLOXACIN 500 MG PO TABS: 500.0000 mg | ORAL_TABLET | Freq: Every day | ORAL | 0 refills | Status: DC

## 2021-09-28 NOTE — Progress Notes (Signed)
? ?Subjective:  ? ? Patient ID: [name], female    DOB: 11-Mar-1951, 71 y.o.   MRN: [id number] ? ?HPI ?Pt here for follow up. Pt has since been taking Sertraline 50 mg once daily.  Overall feels that it helps but would like to increase the dose slightly. ?Describes her chronic neuropathy is stable and tolerable.  Has increased her activity which has helped her symptoms.  Defers at this time. ?Has a history of chronic allergies, has seen an allergy specialist once before.  Also has asthma which overall is well controlled, has only used albuterol inhaler twice in the past week.  Rarely has to use this.  Was seen in January for bacterial upper respiratory illness.  Seem to clear up and then 3 weeks ago she began having more congestion and symptoms.  2 weeks ago began having cough.  Began having chest congestion in the last week which has gotten worse.  No chest pain.  Some shortness of breath and wheezing at times.  No fever. sore throat.  Ear pressure.  Has producing green mucus.  Facial area pressure and headache.  Currently on Flonase and fexofenadine daily for her symptoms. ?Also needs a new order for her osteoporosis infusion, is no longer to be offered at local hospital.  Interested in trying a different medication. ? ? ?   ?Objective:  ? Physical Exam ?NAD.  Alert, oriented.  Calm cheerful affect.  Making good eye contact.  Dressed appropriately for the weather.  Speech clear.  Thoughts logical coherent and relevant.  TMs clear effusion, no erythema.  Pharynx mildly erythematous with green PND noted.  Neck supple with mild soft anterior adenopathy.  Lungs clear.  Mildly diminished breath sounds.  No active wheezing.  No tachypnea.  Heart regular rate rhythm. ?Today's Vitals  ? 09/28/21 1104  ?BP: 130/74  ?Pulse: 89  ?Temp: (!) 94.5 ?F (34.7 ?C)  ?SpO2: 97%  ?Weight: 188 lb 9.6 oz (85.5 kg)  ? ?Body mass index is 31.87 kg/m?. ? ?   ?Assessment & Plan:  ? ?Problem List Items Addressed This Visit   ? ?  ?  Respiratory  ? Mild intermittent asthma without complication  ? Relevant Medications  ? montelukast (SINGULAIR) 10 MG tablet  ?  ? Nervous and Auditory  ? Idiopathic peripheral neuropathy  ? Relevant Medications  ? sertraline (ZOLOFT) 50 MG tablet  ?  ? Other  ? Depression with anxiety  ? Relevant Medications  ? sertraline (ZOLOFT) 50 MG tablet  ? ?Other Visit Diagnoses   ? ? Acute bacterial rhinosinusitis    -  Primary  ? Relevant Medications  ? levofloxacin (LEVAQUIN) 500 MG tablet  ? Mixed rhinitis      ? ?  ? ?Meds ordered this encounter  ?Medications  ? triamcinolone cream (KENALOG) 0.1 %  ?  Sig: Apply 1 application. topically 2 (two) times daily. Prn rash; use up to 2 weeks  ?  Dispense:  30 g  ?  Refill:  0  ? levofloxacin (LEVAQUIN) 500 MG tablet  ?  Sig: Take 1 tablet (500 mg total) by mouth daily.  ?  Dispense:  10 tablet  ?  Refill:  0  ?  Order Specific Question:   Supervising Provider  ?  Answer:   [name] A [[id number]]  ? montelukast (SINGULAIR) 10 MG tablet  ?  Sig: Take 1 tablet (10 mg total) by mouth at bedtime. For allergies and asthma  ?  Dispense:  30 tablet  ?  Refill:  2  ?  Order Specific Question:   Supervising Provider  ?  Answer:   [name] A [[id number]]  ? sertraline (ZOLOFT) 50 MG tablet  ?  Sig: Take 1 1/2 tabs po qd  ?  Dispense:  45 tablet  ?  Refill:  2  ?  Order Specific Question:   Supervising Provider  ?  Answer:   [name] A [[id number]]  ? ?Since no significant exacerbation of her asthma and very limited use of albuterol, no new inhalers ordered today. ?Continue current regimen, add Singulair. ?Levaquin as directed.  Reviewed potential risk associated with use. ?Increase sertraline to 1-1/2 tabs p.o. daily (75 mg).  Go back to 50 mg dose and contact office if any problems.  Let us know if she needs further increases in dosing. ?We will check on orders for her infusion for osteoporosis. ?Defers medication for neuropathy at this time. ?Call back next week if no improvement in  respiratory symptoms, go to ED or urgent care sooner if worse.  Warning signs reviewed. ?Return in about 3 months (around 12/28/2021). ? ? ?

## 2021-09-28 NOTE — Progress Notes (Signed)
Remote pacemaker transmission.   

## 2021-09-28 NOTE — Patient Instructions (Signed)
Increase Sertraline 50 1 1/2 tabs (75) per day ?

## 2021-10-20 ENCOUNTER — Other Ambulatory Visit: Payer: Self-pay | Admitting: Nurse Practitioner

## 2021-11-20 ENCOUNTER — Other Ambulatory Visit: Payer: Self-pay | Admitting: Nurse Practitioner

## 2021-11-20 ENCOUNTER — Other Ambulatory Visit: Payer: Self-pay | Admitting: Family Medicine

## 2021-11-21 NOTE — Telephone Encounter (Signed)
May have 90-day on pantoprazole and Topamax needs follow-up office visit as for Flexeril I do not recommend this for adults age 70 and up due to increased risk of falls and injuries

## 2021-12-11 ENCOUNTER — Ambulatory Visit (INDEPENDENT_AMBULATORY_CARE_PROVIDER_SITE_OTHER): Payer: Self-pay

## 2021-12-11 DIAGNOSIS — I442 Atrioventricular block, complete: Secondary | ICD-10-CM

## 2021-12-11 LAB — CUP PACEART REMOTE DEVICE CHECK
Battery Remaining Longevity: 47 mo
Battery Remaining Percentage: 37 %
Battery Voltage: 2.96 V
Brady Statistic AP VP Percent: 1 %
Brady Statistic AP VS Percent: 1 %
Brady Statistic AS VP Percent: 99 %
Brady Statistic AS VS Percent: 1 %
Brady Statistic RA Percent Paced: 1 %
Brady Statistic RV Percent Paced: 99 %
Date Time Interrogation Session: 20230711020017
Implantable Lead Implant Date: 20170418
Implantable Lead Implant Date: 20170418
Implantable Lead Location: 753859
Implantable Lead Location: 753860
Implantable Pulse Generator Implant Date: 20170418
Lead Channel Impedance Value: 560 Ohm
Lead Channel Impedance Value: 590 Ohm
Lead Channel Pacing Threshold Amplitude: 0.5 V
Lead Channel Pacing Threshold Amplitude: 1 V
Lead Channel Pacing Threshold Pulse Width: 0.5 ms
Lead Channel Pacing Threshold Pulse Width: 0.5 ms
Lead Channel Sensing Intrinsic Amplitude: 5 mV
Lead Channel Sensing Intrinsic Amplitude: 7.5 mV
Lead Channel Setting Pacing Amplitude: 1.25 V
Lead Channel Setting Pacing Amplitude: 2 V
Lead Channel Setting Pacing Pulse Width: 0.5 ms
Lead Channel Setting Sensing Sensitivity: 2 mV
Pulse Gen Model: 2272
Pulse Gen Serial Number: 7882213

## 2022-01-03 NOTE — Progress Notes (Signed)
Remote pacemaker transmission.   

## 2022-01-17 ENCOUNTER — Encounter (HOSPITAL_COMMUNITY): Payer: Medicare Other

## 2022-02-22 ENCOUNTER — Other Ambulatory Visit: Payer: Self-pay | Admitting: Family Medicine

## 2022-03-05 ENCOUNTER — Other Ambulatory Visit: Payer: Self-pay

## 2022-03-05 ENCOUNTER — Encounter (HOSPITAL_COMMUNITY)
Admission: RE | Admit: 2022-03-05 | Discharge: 2022-03-05 | Disposition: A | Discharge status: Home / Self Care | Payer: Medicare Other | Source: Ambulatory Visit | Attending: Family Medicine | Admitting: Family Medicine

## 2022-03-05 VITALS — BP 103/53 | HR 73 | Temp 97.9°F | Resp 18 | Ht 64.5 in | Wt 180.0 lb

## 2022-03-05 DIAGNOSIS — M810 Age-related osteoporosis without current pathological fracture: Secondary | ICD-10-CM | POA: Insufficient documentation

## 2022-03-05 LAB — CREATININE, SERUM
Creatinine, Ser: 0.9 mg/dL (ref 0.44–1.00)
GFR, Estimated: >60 mL/min (ref >60)

## 2022-03-05 MED ORDER — ZOLEDRONIC ACID 5 MG/100ML IV SOLN
5.0000 mg | Freq: Once | INTRAVENOUS | Status: AC
  Administered 2022-03-05: 5 mg via INTRAVENOUS
  Filled 2022-03-05 (×2): qty 100

## 2022-03-05 NOTE — Progress Notes (Signed)
Diagnosis: Osteoporosis  Provider:  [name] MD  Procedure: Infusion  IV Type: Peripheral, IV Location: L Antecubital  Reclast (Zolendronic Acid), Dose: 5 mg  Infusion Start Time: 1122  Infusion Stop Time: 2257  Post Infusion IV Care: Patient declined observation and Peripheral IV Discontinued  Discharge: Condition: Good, Destination: Home . AVS provided to patient.   Performed by:  Jonelle Sidle, RN

## 2022-03-12 ENCOUNTER — Ambulatory Visit (INDEPENDENT_AMBULATORY_CARE_PROVIDER_SITE_OTHER): Payer: Medicare Other

## 2022-03-12 DIAGNOSIS — I442 Atrioventricular block, complete: Secondary | ICD-10-CM

## 2022-03-12 LAB — CUP PACEART REMOTE DEVICE CHECK
Battery Remaining Longevity: 42 mo
Battery Remaining Percentage: 34 %
Battery Voltage: 2.96 V
Brady Statistic AP VP Percent: 1 %
Brady Statistic AP VS Percent: 1 %
Brady Statistic AS VP Percent: 99 %
Brady Statistic AS VS Percent: 1 %
Brady Statistic RA Percent Paced: 1 %
Brady Statistic RV Percent Paced: 99 %
Date Time Interrogation Session: 20231010020014
Implantable Lead Implant Date: 20170418
Implantable Lead Implant Date: 20170418
Implantable Lead Location: 753859
Implantable Lead Location: 753860
Implantable Pulse Generator Implant Date: 20170418
Lead Channel Impedance Value: 490 Ohm
Lead Channel Impedance Value: 510 Ohm
Lead Channel Pacing Threshold Amplitude: 0.5 V
Lead Channel Pacing Threshold Amplitude: 1 V
Lead Channel Pacing Threshold Pulse Width: 0.5 ms
Lead Channel Pacing Threshold Pulse Width: 0.5 ms
Lead Channel Sensing Intrinsic Amplitude: 5 mV
Lead Channel Sensing Intrinsic Amplitude: 7.5 mV
Lead Channel Setting Pacing Amplitude: 1.25 V
Lead Channel Setting Pacing Amplitude: 2 V
Lead Channel Setting Pacing Pulse Width: 0.5 ms
Lead Channel Setting Sensing Sensitivity: 2 mV
Pulse Gen Model: 2272
Pulse Gen Serial Number: 7882213

## 2022-03-15 ENCOUNTER — Ambulatory Visit (INDEPENDENT_AMBULATORY_CARE_PROVIDER_SITE_OTHER): Payer: Medicare Other

## 2022-03-15 DIAGNOSIS — Z23 Encounter for immunization: Secondary | ICD-10-CM | POA: Diagnosis not present

## 2022-03-17 ENCOUNTER — Other Ambulatory Visit: Payer: Self-pay | Admitting: Family Medicine

## 2022-03-26 NOTE — Progress Notes (Signed)
Remote pacemaker transmission.   

## 2022-03-29 ENCOUNTER — Other Ambulatory Visit: Payer: Self-pay | Admitting: Family Medicine

## 2022-04-27 ENCOUNTER — Other Ambulatory Visit: Payer: Self-pay | Admitting: Nurse Practitioner

## 2022-05-30 ENCOUNTER — Other Ambulatory Visit: Payer: Self-pay | Admitting: Family Medicine

## 2022-06-06 ENCOUNTER — Encounter: Payer: Medicare Other | Admitting: Cardiology

## 2022-06-11 ENCOUNTER — Ambulatory Visit (INDEPENDENT_AMBULATORY_CARE_PROVIDER_SITE_OTHER): Payer: Self-pay

## 2022-06-11 DIAGNOSIS — I442 Atrioventricular block, complete: Secondary | ICD-10-CM

## 2022-06-11 LAB — CUP PACEART REMOTE DEVICE CHECK
Battery Remaining Longevity: 40 mo
Battery Remaining Percentage: 32 %
Battery Voltage: 2.95 V
Brady Statistic AP VP Percent: 1 %
Brady Statistic AP VS Percent: 1 %
Brady Statistic AS VP Percent: 99 %
Brady Statistic AS VS Percent: 1 %
Brady Statistic RA Percent Paced: 1 %
Brady Statistic RV Percent Paced: 99 %
Date Time Interrogation Session: 20240109020014
Implantable Lead Connection Status: 753985
Implantable Lead Connection Status: 753985
Implantable Lead Implant Date: 20170418
Implantable Lead Implant Date: 20170418
Implantable Lead Location: 753859
Implantable Lead Location: 753860
Implantable Pulse Generator Implant Date: 20170418
Lead Channel Impedance Value: 510 Ohm
Lead Channel Impedance Value: 590 Ohm
Lead Channel Pacing Threshold Amplitude: 0.5 V
Lead Channel Pacing Threshold Amplitude: 1 V
Lead Channel Pacing Threshold Pulse Width: 0.5 ms
Lead Channel Pacing Threshold Pulse Width: 0.5 ms
Lead Channel Sensing Intrinsic Amplitude: 5 mV
Lead Channel Sensing Intrinsic Amplitude: 7.5 mV
Lead Channel Setting Pacing Amplitude: 1.25 V
Lead Channel Setting Pacing Amplitude: 2 V
Lead Channel Setting Pacing Pulse Width: 0.5 ms
Lead Channel Setting Sensing Sensitivity: 2 mV
Pulse Gen Model: 2272
Pulse Gen Serial Number: 7882213

## 2022-06-17 ENCOUNTER — Other Ambulatory Visit: Payer: Self-pay | Admitting: Family Medicine

## 2022-07-04 ENCOUNTER — Other Ambulatory Visit: Payer: Self-pay | Admitting: Family Medicine

## 2022-07-04 NOTE — Telephone Encounter (Signed)
Please contact pt to have her schedule appt. May then send back to nurses. Thank you!

## 2022-07-05 NOTE — Progress Notes (Signed)
Remote pacemaker transmission.   

## 2022-07-05 NOTE — Telephone Encounter (Signed)
Sent message to schedule  appointment 07/05/22

## 2022-07-11 NOTE — Telephone Encounter (Signed)
Sent second request to schedule appointment 07/11/22

## 2022-07-19 ENCOUNTER — Encounter: Payer: Medicare Other | Admitting: Cardiology

## 2022-07-26 ENCOUNTER — Ambulatory Visit (INDEPENDENT_AMBULATORY_CARE_PROVIDER_SITE_OTHER): Payer: Medicare Other | Admitting: Nurse Practitioner

## 2022-07-26 VITALS — BP 130/78 | Wt 176.6 lb

## 2022-07-26 DIAGNOSIS — F418 Other specified anxiety disorders: Secondary | ICD-10-CM

## 2022-07-26 DIAGNOSIS — K219 Gastro-esophageal reflux disease without esophagitis: Secondary | ICD-10-CM

## 2022-07-26 DIAGNOSIS — Z634 Disappearance and death of family member: Secondary | ICD-10-CM

## 2022-07-26 DIAGNOSIS — G43709 Chronic migraine without aura, not intractable, without status migrainosus: Secondary | ICD-10-CM | POA: Diagnosis not present

## 2022-07-26 DIAGNOSIS — J300 Vasomotor rhinitis: Secondary | ICD-10-CM

## 2022-07-26 DIAGNOSIS — F5104 Psychophysiologic insomnia: Secondary | ICD-10-CM

## 2022-07-26 NOTE — Progress Notes (Signed)
Subjective:    Patient ID: [name], female    DOB: Oct 12, 1950, 72 y.o.   MRN: [id number]  HPI Patient arrives to discuss sertraline. Patient states she lost her brother unexpectedly an is having a tough time with it.  States her brother lived with her for over 25 years, states he had a mental health disability. Also having continued sinus symptoms for 6 months.  States she had to decrease her Zoloft dose back to 50 mg, was having nightmares at the 75 mg dose.  These have resolved since then.  Continues to have difficulty with sleep.  States she goes to sleep fine but wakes up every 2-3 hours and has difficulty going back to sleep.  Averaging 4 to 6 hours of sleep a night.  Migraines are overall controlled with topiramate.  Due to sinus pressure and stress, had some flare of her migraines over the past few months and had to increase the use of rizatriptan.  This has since improved.  Normally has to rarely use this.  Has had off-and-on sinus symptoms for the past 6 months.  Occasional facial/sinus pressure.  No fever.  Had some greenish drainage a few days ago but this has turned back to clear.  Some ear pressure.  No sore throat.  No cough other than clearing her throat from postnasal drainage.  No wheezing.  No relief with OTC Allegra and use of Singulair.  Denies suicidal or homicidal thoughts or ideation. Requesting a refill on her pantoprazole which she takes occasionally for reflux.  This has been well-controlled.  Review of Systems  Constitutional:  Positive for fatigue. Negative for fever.  HENT:  Positive for congestion, postnasal drip, sinus pressure and sinus pain. Negative for ear pain, sore throat and trouble swallowing.   Respiratory:  Negative for cough, chest tightness, shortness of breath and wheezing.   Cardiovascular:  Negative for chest pain.  Gastrointestinal:  Negative for abdominal pain.  Psychiatric/Behavioral:  Positive for sleep disturbance. Negative for suicidal  ideas.        Objective:   Physical Exam NAD.  Alert, oriented.  Emotional at times especially when she is talking about her brother.  Speech clear.  Dressed appropriately for the weather.  Thoughts logical coherent and relevant.  Right TM partially obscured with light-colored cerumen.  Left TM retracted, no erythema.  Nasal mucosa pale and boggy.  Pharynx minimally injected with cloudy PND noted.  Neck supple with mild soft anterior adenopathy.  Lungs clear.  Heart regular rate rhythm.  Abdomen soft nondistended nontender. Today's Vitals   07/26/22 1524  BP: 130/78  Weight: 176 lb 9.6 oz (80.1 kg)   Body mass index is 29.85 kg/m.      Assessment & Plan:  1. Chronic migraine without aura without status migrainosus, not intractable Continue topiramate as directed.  Continue to use rizatriptan sparingly as directed.  2. Gastroesophageal reflux disease without esophagitis Given refills on pantoprazole to take on a as needed basis.  Discussed lifestyle factors affecting her reflux symptoms.  3. Depression with anxiety Continue Zoloft 50 mg daily.    4. Bereavement Defers grief counseling at this time.    5. Vasomotor rhinitis Restart Flonase as directed.  Patient advised she may need to take Singulair and Allegra especially during the spring allergy season.  Warning signs reviewed regarding her sinus symptoms, call back if worsens or persist.  6. Psychophysiological insomnia Start trazodone as directed as needed insomnia.  Reviewed potential adverse effects.  Discontinue  medication and contact office if any problems. Meds ordered this encounter  Medications   pantoprazole (PROTONIX) 40 MG tablet    Sig: TAKE 1 TABLET BY MOUTH EVERY DAY AS NEEDED FOR ACID REFLUX    Dispense:  90 tablet    Refill:  0    Order Specific Question:   Supervising Provider    Answer:   [name] A [[id number]]   rizatriptan (MAXALT) 10 MG tablet    Sig: Take one at onset of migraine; may repeat in 2  hours if needed; max 2 per 24 hours    Dispense:  18 tablet    Refill:  0    Order Specific Question:   Supervising Provider    Answer:   [name] A [[id number]]   sertraline (ZOLOFT) 50 MG tablet    Sig: TAKE 1 TABLET BY MOUTH EVERY DAY    Dispense:  90 tablet    Refill:  1    Order Specific Question:   Supervising Provider    Answer:   [name] A [[id number]]   topiramate (TOPAMAX) 100 MG tablet    Sig: Take 1 tablet (100 mg total) by mouth 2 (two) times daily.    Dispense:  180 tablet    Refill:  1    Order Specific Question:   Supervising Provider    Answer:   [name] A [[id number]]   fluticasone (FLONASE) 50 MCG/ACT nasal spray    Sig: Place 2 sprays into both nostrils daily. Prn head congestion    Dispense:  16 g    Refill:  11    Order Specific Question:   Supervising Provider    Answer:   [name] A [[id number]]   traZODone (DESYREL) 50 MG tablet    Sig: Take 0.5-1 tablets (25-50 mg total) by mouth at bedtime as needed for sleep.    Dispense:  30 tablet    Refill:  0    Order Specific Question:   Supervising Provider    Answer:   [name] A [[id number]]  Return in about 6 months (around 01/24/2023). Recommend preventive health physical including routine lab work within the next few months.

## 2022-07-27 ENCOUNTER — Encounter: Payer: Self-pay | Admitting: Nurse Practitioner

## 2022-07-27 DIAGNOSIS — Z634 Disappearance and death of family member: Secondary | ICD-10-CM | POA: Insufficient documentation

## 2022-07-27 MED ORDER — FLUTICASONE PROPIONATE 50 MCG/ACT NA SUSP: 2.0000 | Freq: Every day | NASAL | 11 refills | Status: DC

## 2022-07-27 MED ORDER — PANTOPRAZOLE SODIUM 40 MG PO TBEC: DELAYED_RELEASE_TABLET | ORAL | 0 refills | Status: DC

## 2022-07-27 MED ORDER — RIZATRIPTAN BENZOATE 10 MG PO TABS: ORAL_TABLET | ORAL | 0 refills | Status: DC

## 2022-07-27 MED ORDER — TRAZODONE HCL 50 MG PO TABS: 25.0000 mg | ORAL_TABLET | Freq: Every evening | ORAL | 0 refills | Status: DC | PRN

## 2022-07-27 MED ORDER — SERTRALINE HCL 50 MG PO TABS: ORAL_TABLET | ORAL | 1 refills | Status: DC

## 2022-07-27 MED ORDER — TOPIRAMATE 100 MG PO TABS: 100.0000 mg | ORAL_TABLET | Freq: Two times a day (BID) | ORAL | 1 refills | Status: DC

## 2022-08-06 ENCOUNTER — Other Ambulatory Visit: Payer: Self-pay | Admitting: Family Medicine

## 2022-08-06 MED ORDER — RIZATRIPTAN BENZOATE 10 MG PO TABS: ORAL_TABLET | ORAL | 6 refills | Status: DC

## 2022-08-19 ENCOUNTER — Other Ambulatory Visit: Payer: Self-pay | Admitting: Nurse Practitioner

## 2022-08-27 ENCOUNTER — Encounter: Payer: Self-pay | Admitting: Cardiology

## 2022-08-27 ENCOUNTER — Ambulatory Visit: Payer: Medicare Other | Attending: Cardiology | Admitting: Cardiology

## 2022-08-27 VITALS — BP 118/64 | HR 66 | Ht 64.5 in | Wt 179.0 lb

## 2022-08-27 DIAGNOSIS — I442 Atrioventricular block, complete: Secondary | ICD-10-CM | POA: Diagnosis not present

## 2022-08-27 NOTE — Progress Notes (Signed)
Electrophysiology Office Note   Date:  08/27/2022   ID:  [name], DOB 18-Apr-1951, MRN [id number]  PCP:  Kathyrn Drown, MD  Primary Electrophysiologist:  Constance Haw, MD    No chief complaint on file.     History of Present Illness: ANNELYSE Raymond is a 72 y.o. female who presents today for electrophysiology evaluation.     She has a history significant for complete heart block and hyperlipidemia.  She presented to the hospital was found to be in complete heart block and is post [facility] dual-chamber pacemaker implanted 09/19/2015.  Today, denies symptoms of palpitations, chest pain, shortness of breath, orthopnea, PND, lower extremity edema, claudication, dizziness, presyncope, syncope, bleeding, or neurologic sequela. The patient is tolerating medications without difficulties.  She is currently feeling well.  She has no chest pain or shortness of breath.  She has had all her daily activities without restriction.  Her granddaughter is currently awaiting a kidney transplant.  She is not yet on dialysis.    Past Medical History:  Diagnosis Date   Acid reflux    Allergy    Anxiety    Complete heart block ([place]) 09/2015   a. s/p St. Jude PPM 09/2015.   Dyspnea    Former tobacco use    Hyperlipidemia    Irritable bowel syndrome (IBS) 02/27/2017   Mast cell disease    Migraines    Chronic   PAT (paroxysmal atrial tachycardia)    PONV (postoperative nausea and vomiting)    Presence of permanent cardiac pacemaker 09/19/2015   Sinusitis    Sleep apnea    Urticaria    Past Surgical History:  Procedure Laterality Date   APPENDECTOMY     AUGMENTATION MAMMAPLASTY Bilateral    BIOPSY  06/17/2017   Procedure: BIOPSY;  Surgeon: Danie Binder, MD;  [facility]: [facility];  Service: Endoscopy;;  colon   BREAST ENHANCEMENT SURGERY  1985   CHOLECYSTECTOMY     COLONOSCOPY     COLONOSCOPY WITH PROPOFOL N/A 06/17/2017   Dr. Oneida Alar: 30 mm polyp removed from the  ascending colon piecemeal, tattooed, tubular adenoma.  8 mm polyp in the ascending colon, four 3 to 5 mm polyps removed from the rectum and transverse colon.  Tubular adenomas.  External and internal hemorrhoids.  Redundant colon.  Random colon biopsies negative.  Next colonoscopy in 3 years.   EP IMPLANTABLE DEVICE N/A 09/19/2015   Procedure: Pacemaker Implant;  Surgeon: Maiya Kates Meredith Leeds, MD;  Location: [facility];  Service: Cardiovascular;  Laterality: N/A;   ESOPHAGOGASTRODUODENOSCOPY (EGD) WITH PROPOFOL N/A 06/17/2017   Dr. Oneida Alar: Medium sized hiatal hernia, mild erosive gastritis, small bowel biopsies negative for celiac.  Gastric biopsies with chronic mild inactive gastritis but no H. pylori.   INSERT / REPLACE / REMOVE PACEMAKER     POLYPECTOMY  06/17/2017   Procedure: POLYPECTOMY;  Surgeon: Danie Binder, MD;  Location: [facility];  Service: Endoscopy;;  colon   TONSILLECTOMY AND ADENOIDECTOMY     TUBAL LIGATION       Current Outpatient Medications  Medication Sig Dispense Refill   Biotin 5000 MCG CAPS Take 5,000 mcg by mouth daily.     Carboxymethylcellulose Sodium (THERATEARS OP) Place 5 drops into both eyes daily.     fexofenadine (ALLEGRA) 30 MG tablet Take 30 mg by mouth daily.      fluticasone (FLONASE) 50 MCG/ACT nasal spray Place 2 sprays into both nostrils daily. Prn  head congestion 16 g 11   montelukast (SINGULAIR) 10 MG tablet TAKE 1 TABLET (10 MG TOTAL) BY MOUTH AT BEDTIME. FOR ALLERGIES AND ASTHMA 90 tablet 1   Multiple Vitamin (MULTIVITAMIN WITH MINERALS) TABS tablet Take 1 tablet by mouth daily.     OVER THE COUNTER MEDICATION B12 1000 mcg one daily     pantoprazole (PROTONIX) 40 MG tablet TAKE 1 TABLET BY MOUTH EVERY DAY AS NEEDED FOR ACID REFLUX 90 tablet 0   rizatriptan (MAXALT) 10 MG tablet Take one at onset of migraine; may repeat in 2 hours if needed; max 2 per 24 hours 12 tablet 6   sertraline (ZOLOFT) 50 MG tablet TAKE 1 TABLET BY MOUTH EVERY DAY  90 tablet 1   topiramate (TOPAMAX) 100 MG tablet Take 1 tablet (100 mg total) by mouth 2 (two) times daily. 180 tablet 1   traZODone (DESYREL) 50 MG tablet TAKE 1/2 TO 1 TABLET BY MOUTH AT BEDTIME AS NEEDED FOR SLEEP. 90 tablet 1   triamcinolone cream (KENALOG) 0.1 % Apply 1 application. topically 2 (two) times daily. Prn rash; use up to 2 weeks 30 g 0   vitamin C (ASCORBIC ACID) 500 MG tablet Take 500 mg by mouth daily.      No current facility-administered medications for this visit.    Allergies:   Beta adrenergic blockers, Ambien [zolpidem tartrate], Biaxin [clarithromycin], Duloxetine hcl, and Fosamax [alendronate sodium]   Social History:  The patient  reports that she quit smoking about 18 years ago. Her smoking use included cigarettes. She has a 30.00 pack-year smoking history. She has never used smokeless tobacco. She reports that she does not drink alcohol and does not use drugs.   Family History:  The patient's family history includes Allergic rhinitis in her maternal grandmother; Asthma in her maternal grandmother and paternal grandmother; Cancer in her maternal grandfather; Congestive Heart Failure in her mother; Dementia in her mother; Diabetes in her maternal grandmother, mother, paternal grandmother, and sister; Heart attack in her paternal grandmother; Heart disease in her paternal grandmother; Lung cancer in her father; Parkinson's disease in her mother.   ROS:  Please see the history of present illness.   Otherwise, review of systems is positive for none.   All other systems are reviewed and negative.   PHYSICAL EXAM: VS:  BP 118/64   Pulse 66   Ht 5' 4.5" (1.638 m)   Wt 179 lb (81.2 kg)   SpO2 98%   BMI 30.25 kg/m  , BMI Body mass index is 30.25 kg/m. GEN: Well nourished, well developed, in no acute distress  HEENT: normal  Neck: no JVD, carotid bruits, or masses Cardiac: RRR; no murmurs, rubs, or gallops,no edema  Respiratory:  clear to auscultation bilaterally,  normal work of breathing GI: soft, nontender, nondistended, + BS MS: no deformity or atrophy  Skin: warm and dry, device site well healed Neuro:  Strength and sensation are intact Psych: euthymic mood, full affect  EKG:  EKG is ordered today. Personal review of the ekg ordered shows Arbie Cookey sensed, ventricular paced  Personal review of the device interrogation today. Results in [place]: 03/05/2022: Creatinine, Ser 0.90    Lipid Panel     Component Value Date/Time   CHOL 245 (H) 12/12/2020 0913   TRIG 133 12/12/2020 0913   HDL 69 12/12/2020 0913   CHOLHDL 3.6 12/12/2020 0913   CHOLHDL 2.4 01/19/2014 0918   VLDL 13 01/19/2014 0918   LDLCALC 153 (H)  12/12/2020 0913     Wt Readings from Last 3 Encounters:  08/27/22 179 lb (81.2 kg)  07/26/22 176 lb 9.6 oz (80.1 kg)  03/05/22 180 lb (81.6 kg)      Other studies Reviewed: Additional studies/ records that were reviewed today include: TTE 01/23/16 - Left ventricle: The cavity size was normal. Wall thickness was   normal. Systolic function was normal. The estimated ejection   fraction was in the range of 55% to 60%. Wall motion was normal;   there were no regional wall motion abnormalities. Doppler   parameters are consistent with abnormal left ventricular   relaxation (grade 1 diastolic dysfunction). - Aortic valve: Trileaflet; mildly calcified leaflets. - Right ventricle: Pacer wire or catheter noted in right ventricle. - Right atrium: Central venous pressure (est): 3 mm Hg. - Tricuspid valve: There was trivial regurgitation. - Pulmonary arteries: PA peak pressure: 20 mm Hg (S). - Pericardium, extracardiac: There was no pericardial effusion.  Holter 02/12/16 Sinus rhythm 1 degree AV block Minimum HR: 50 BPM at 12:59:11 AM Maximum HR: 118 BPM at 8:00:47 AM Average HR: 74 BPM.  ASSESSMENT AND PLAN:  1.  Complete heart block: Status post [employer] Jude dual-chamber pacemaker implanted 09/19/2018.  Sensing,  threshold, impedance within normal limits and stable.  Device functioning appropriately.  No changes at this time.  2.  Hyperlipidemia: Continue atorvastatin per primary physician.  Current medicines are reviewed at length with the patient today.   The patient does not have concerns regarding her medicines.  The following changes were made today: None  Labs/ tests ordered today include:  Orders Placed This Encounter  Procedures   EKG 12-Lead      Disposition:   FU with Bartosz Luginbill 12 months  Signed, Donicia Druck Meredith Leeds, MD  08/27/2022 4:20 PM     [facility] [address] [place] [address] [phone] (office) [phone] (fax)

## 2022-09-10 ENCOUNTER — Ambulatory Visit (INDEPENDENT_AMBULATORY_CARE_PROVIDER_SITE_OTHER): Payer: Medicare Other

## 2022-09-10 DIAGNOSIS — I442 Atrioventricular block, complete: Secondary | ICD-10-CM

## 2022-09-11 LAB — CUP PACEART REMOTE DEVICE CHECK
Battery Remaining Longevity: 36 mo
Battery Remaining Percentage: 29 %
Battery Voltage: 2.95 V
Brady Statistic AP VP Percent: 1 %
Brady Statistic AP VS Percent: 0 %
Brady Statistic AS VP Percent: 99 %
Brady Statistic AS VS Percent: 1 %
Brady Statistic RA Percent Paced: 1 %
Brady Statistic RV Percent Paced: 99 %
Date Time Interrogation Session: 20240409030157
Implantable Lead Connection Status: 753985
Implantable Lead Connection Status: 753985
Implantable Lead Implant Date: 20170418
Implantable Lead Implant Date: 20170418
Implantable Lead Location: 753859
Implantable Lead Location: 753860
Implantable Pulse Generator Implant Date: 20170418
Lead Channel Impedance Value: 510 Ohm
Lead Channel Impedance Value: 560 Ohm
Lead Channel Pacing Threshold Amplitude: 0.5 V
Lead Channel Pacing Threshold Amplitude: 0.875 V
Lead Channel Pacing Threshold Pulse Width: 0.5 ms
Lead Channel Pacing Threshold Pulse Width: 0.5 ms
Lead Channel Sensing Intrinsic Amplitude: 5 mV
Lead Channel Sensing Intrinsic Amplitude: 7.5 mV
Lead Channel Setting Pacing Amplitude: 1.125
Lead Channel Setting Pacing Amplitude: 2 V
Lead Channel Setting Pacing Pulse Width: 0.5 ms
Lead Channel Setting Sensing Sensitivity: 2 mV
Pulse Gen Model: 2272
Pulse Gen Serial Number: 7882213

## 2022-09-19 NOTE — Patient Instructions (Signed)
Ms. [name] , Thank you for taking time to come for your Medicare Wellness Visit. I appreciate your ongoing commitment to your health goals. Please review the following plan we discussed and let me know if I can assist you in the future.   These are the goals we discussed:  Goals      Remain active and independent        This is a list of the screening recommended for you and due dates:  Health Maintenance  Topic Date Due   Hepatitis C Screening: USPSTF Recommendation to screen - Ages 25-79 yo.  Never done   DTaP/Tdap/Td vaccine (2 - Tdap) 10/01/2021   Mammogram  07/28/2023*   Colon Cancer Screening  07/28/2023*   Flu Shot  01/02/2023   Medicare Annual Wellness Visit  09/20/2023   Pneumonia Vaccine  Completed   DEXA scan (bone density measurement)  Completed   Zoster (Shingles) Vaccine  Completed   HPV Vaccine  Aged Out   COVID-19 Vaccine  Discontinued  *Topic was postponed. The date shown is not the original due date.    Advanced directives: Please bring a copy of your health care power of attorney and living will to the office to be added to your chart at your convenience.   Conditions/risks identified: Aim for 30 minutes of exercise or brisk walking, 6-8 glasses of water, and 5 servings of fruits and vegetables each day.  Next appointment: Follow up in one year for your annual wellness visit   The number to schedule your mammogram at [facility] is [phone]   Preventive Care 65 Years and Older, Female Preventive care refers to lifestyle choices and visits with your health care provider that can promote health and wellness. What does preventive care include? A yearly physical exam. This is also called an annual well check. Dental exams once or twice a year. Routine eye exams. Ask your health care provider how often you should have your eyes checked. Personal lifestyle choices, including: Daily care of your teeth and gums. Regular physical activity. Eating a healthy  diet. Avoiding tobacco and drug use. Limiting alcohol use. Practicing safe sex. Taking low-dose aspirin every day. Taking vitamin and mineral supplements as recommended by your health care provider. What happens during an annual well check? The services and screenings done by your health care provider during your annual well check will depend on your age, overall health, lifestyle risk factors, and family history of disease. Counseling  Your health care provider may ask you questions about your: Alcohol use. Tobacco use. Drug use. Emotional well-being. Home and relationship well-being. Sexual activity. Eating habits. History of falls. Memory and ability to understand (cognition). Work and work Astronomer. Reproductive health. Screening  You may have the following tests or measurements: Height, weight, and BMI. Blood pressure. Lipid and cholesterol levels. These may be checked every 5 years, or more frequently if you are over 105 years old. Skin check. Lung cancer screening. You may have this screening every year starting at age 42 if you have a 30-pack-year history of smoking and currently smoke or have quit within the past 15 years. Fecal occult blood test (FOBT) of the stool. You may have this test every year starting at age 87. Flexible sigmoidoscopy or colonoscopy. You may have a sigmoidoscopy every 5 years or a colonoscopy every 10 years starting at age 39. Hepatitis C blood test. Hepatitis B blood test. Sexually transmitted disease (STD) testing. Diabetes screening. This is done by checking your blood  sugar (glucose) after you have not eaten for a while (fasting). You may have this done every 1-3 years. Bone density scan. This is done to screen for osteoporosis. You may have this done starting at age 20. Mammogram. This may be done every 1-2 years. Talk to your health care provider about how often you should have regular mammograms. Talk with your health care provider about  your test results, treatment options, and if necessary, the need for more tests. Vaccines  Your health care provider may recommend certain vaccines, such as: Influenza vaccine. This is recommended every year. Tetanus, diphtheria, and acellular pertussis (Tdap, Td) vaccine. You may need a Td booster every 10 years. Zoster vaccine. You may need this after age 76. Pneumococcal 13-valent conjugate (PCV13) vaccine. One dose is recommended after age 71. Pneumococcal polysaccharide (PPSV23) vaccine. One dose is recommended after age 54. Talk to your health care provider about which screenings and vaccines you need and how often you need them. This information is not intended to replace advice given to you by your health care provider. Make sure you discuss any questions you have with your health care provider. Document Released: 06/16/2015 Document Revised: 02/07/2016 Document Reviewed: 03/21/2015 Elsevier Interactive Patient Education  2017 [place] Prevention in the Home Falls can cause injuries. They can happen to people of all ages. There are many things you can do to make your home safe and to help prevent falls. What can I do on the outside of my home? Regularly fix the edges of walkways and driveways and fix any cracks. Remove anything that might make you trip as you walk through a door, such as a raised step or threshold. Trim any bushes or trees on the path to your home. Use bright outdoor lighting. Clear any walking paths of anything that might make someone trip, such as rocks or tools. Regularly check to see if handrails are loose or broken. Make sure that both sides of any steps have handrails. Any raised decks and porches should have guardrails on the edges. Have any leaves, snow, or ice cleared regularly. Use sand or salt on walking paths during winter. Clean up any spills in your garage right away. This includes oil or grease spills. What can I do in the bathroom? Use  night lights. Install grab bars by the toilet and in the tub and shower. Do not use towel bars as grab bars. Use non-skid mats or decals in the tub or shower. If you need to sit down in the shower, use a plastic, non-slip stool. Keep the floor dry. Clean up any water that spills on the floor as soon as it happens. Remove soap buildup in the tub or shower regularly. Attach bath mats securely with double-sided non-slip rug tape. Do not have throw rugs and other things on the floor that can make you trip. What can I do in the bedroom? Use night lights. Make sure that you have a light by your bed that is easy to reach. Do not use any sheets or blankets that are too big for your bed. They should not hang down onto the floor. Have a firm chair that has side arms. You can use this for support while you get dressed. Do not have throw rugs and other things on the floor that can make you trip. What can I do in the kitchen? Clean up any spills right away. Avoid walking on wet floors. Keep items that you use a lot in easy-to-reach  places. If you need to reach something above you, use a strong step stool that has a grab bar. Keep electrical cords out of the way. Do not use floor polish or wax that makes floors slippery. If you must use wax, use non-skid floor wax. Do not have throw rugs and other things on the floor that can make you trip. What can I do with my stairs? Do not leave any items on the stairs. Make sure that there are handrails on both sides of the stairs and use them. Fix handrails that are broken or loose. Make sure that handrails are as long as the stairways. Check any carpeting to make sure that it is firmly attached to the stairs. Fix any carpet that is loose or worn. Avoid having throw rugs at the top or bottom of the stairs. If you do have throw rugs, attach them to the floor with carpet tape. Make sure that you have a light switch at the top of the stairs and the bottom of the  stairs. If you do not have them, ask someone to add them for you. What else can I do to help prevent falls? Wear shoes that: Do not have high heels. Have rubber bottoms. Are comfortable and fit you well. Are closed at the toe. Do not wear sandals. If you use a stepladder: Make sure that it is fully opened. Do not climb a closed stepladder. Make sure that both sides of the stepladder are locked into place. Ask someone to hold it for you, if possible. Clearly mark and make sure that you can see: Any grab bars or handrails. First and last steps. Where the edge of each step is. Use tools that help you move around (mobility aids) if they are needed. These include: Canes. Walkers. Scooters. Crutches. Turn on the lights when you go into a dark area. Replace any light bulbs as soon as they burn out. Set up your furniture so you have a clear path. Avoid moving your furniture around. If any of your floors are uneven, fix them. If there are any pets around you, be aware of where they are. Review your medicines with your doctor. Some medicines can make you feel dizzy. This can increase your chance of falling. Ask your doctor what other things that you can do to help prevent falls. This information is not intended to replace advice given to you by your health care provider. Make sure you discuss any questions you have with your health care provider. Document Released: 03/16/2009 Document Revised: 10/26/2015 Document Reviewed: 06/24/2014 Elsevier Interactive Patient Education  2017 [employer].

## 2022-09-19 NOTE — Progress Notes (Signed)
Subjective:   [name] is a 72 y.o. female who presents for Medicare Annual (Subsequent) preventive examination.  I connected with  Curry Dulski Millette on 09/20/22 by a audio enabled telemedicine application and verified that I am speaking with the correct person using two identifiers.  Patient Location: Home  Provider Location: Office/Clinic  I discussed the limitations of evaluation and management by telemedicine. The patient expressed understanding and agreed to proceed.  Review of Systems     Cardiac Risk Factors include: advanced age (>26men, >61 women)     Objective:    Today's Vitals   09/20/22 1124  Weight: 179 lb (81.2 kg)  Height: 5' 4.5" (1.638 m)   Body mass index is 30.25 kg/m.     09/20/2022   11:36 AM 09/11/2021    3:14 PM 06/13/2017    9:15 AM 10/28/2016   10:33 AM 09/04/2016    9:18 AM 08/19/2016    1:45 PM 01/12/2016    9:48 AM  Advanced Directives  Does Patient Have a Medical Advance Directive? Yes Yes No No No No No  Type of Advance Directive Living will Healthcare Power of [place];Living will       Does patient want to make changes to medical advance directive? No - Patient declined        Copy of Healthcare Power of Attorney in Chart?  No - copy requested       Would patient like information on creating a medical advance directive?   No - Patient declined  No - Patient declined No - Patient declined No - patient declined information    Current Medications (verified) Outpatient Encounter Medications as of 09/20/2022  Medication Sig   Biotin 5000 MCG CAPS Take 5,000 mcg by mouth daily.   Carboxymethylcellulose Sodium (THERATEARS OP) Place 5 drops into both eyes daily.   fexofenadine (ALLEGRA) 30 MG tablet Take 30 mg by mouth daily.    fluticasone (FLONASE) 50 MCG/ACT nasal spray Place 2 sprays into both nostrils daily. Prn head congestion   montelukast (SINGULAIR) 10 MG tablet TAKE 1 TABLET (10 MG TOTAL) BY MOUTH AT BEDTIME. FOR ALLERGIES AND  ASTHMA   Multiple Vitamin (MULTIVITAMIN WITH MINERALS) TABS tablet Take 1 tablet by mouth daily.   OVER THE COUNTER MEDICATION B12 1000 mcg one daily   pantoprazole (PROTONIX) 40 MG tablet TAKE 1 TABLET BY MOUTH EVERY DAY AS NEEDED FOR ACID REFLUX   rizatriptan (MAXALT) 10 MG tablet Take one at onset of migraine; may repeat in 2 hours if needed; max 2 per 24 hours   sertraline (ZOLOFT) 50 MG tablet TAKE 1 TABLET BY MOUTH EVERY DAY   topiramate (TOPAMAX) 100 MG tablet Take 1 tablet (100 mg total) by mouth 2 (two) times daily.   triamcinolone cream (KENALOG) 0.1 % Apply 1 application. topically 2 (two) times daily. Prn rash; use up to 2 weeks   vitamin C (ASCORBIC ACID) 500 MG tablet Take 500 mg by mouth daily.    traZODone (DESYREL) 50 MG tablet TAKE 1/2 TO 1 TABLET BY MOUTH AT BEDTIME AS NEEDED FOR SLEEP. (Patient not taking: Reported on 09/20/2022)   No facility-administered encounter medications on file as of 09/20/2022.    Allergies (verified) Beta adrenergic blockers, Ambien [zolpidem tartrate], Biaxin [clarithromycin], Duloxetine hcl, and Fosamax [alendronate sodium]   History: Past Medical History:  Diagnosis Date   Acid reflux    Allergy    Anxiety    Complete heart block 09/2015   a. s/p  St. Jude PPM 09/2015.   Dyspnea    Former tobacco use    Hyperlipidemia    Irritable bowel syndrome (IBS) 02/27/2017   Mast cell disease    Migraines    Chronic   PAT (paroxysmal atrial tachycardia)    PONV (postoperative nausea and vomiting)    Presence of permanent cardiac pacemaker 09/19/2015   Sinusitis    Sleep apnea    Urticaria    Past Surgical History:  Procedure Laterality Date   APPENDECTOMY     AUGMENTATION MAMMAPLASTY Bilateral    BIOPSY  06/17/2017   Procedure: BIOPSY;  Surgeon: West Bali, MD;  Location: [facility];  Service: Endoscopy;;  colon   BREAST ENHANCEMENT SURGERY  1985   CHOLECYSTECTOMY     COLONOSCOPY     COLONOSCOPY WITH PROPOFOL N/A 06/17/2017   Dr.  Darrick Penna: 30 mm polyp removed from the ascending colon piecemeal, tattooed, tubular adenoma.  8 mm polyp in the ascending colon, four 3 to 5 mm polyps removed from the rectum and transverse colon.  Tubular adenomas.  External and internal hemorrhoids.  Redundant colon.  Random colon biopsies negative.  Next colonoscopy in 3 years.   EP IMPLANTABLE DEVICE N/A 09/19/2015   Procedure: Pacemaker Implant;  Surgeon: Will Jorja Loa, MD;  Location: [facility];  Service: Cardiovascular;  Laterality: N/A;   ESOPHAGOGASTRODUODENOSCOPY (EGD) WITH PROPOFOL N/A 06/17/2017   Dr. Darrick Penna: Medium sized hiatal hernia, mild erosive gastritis, small bowel biopsies negative for celiac.  Gastric biopsies with chronic mild inactive gastritis but no H. pylori.   INSERT / REPLACE / REMOVE PACEMAKER     POLYPECTOMY  06/17/2017   Procedure: POLYPECTOMY;  Surgeon: West Bali, MD;  Location: [facility];  Service: Endoscopy;;  colon   TONSILLECTOMY AND ADENOIDECTOMY     TUBAL LIGATION     Family History  Problem Relation Age of Onset   Congestive Heart Failure Mother    Diabetes Mother    Dementia Mother    Parkinson's disease Mother    Lung cancer Father    Diabetes Sister    Diabetes Maternal Grandmother    Allergic rhinitis Maternal Grandmother    Asthma Maternal Grandmother    Cancer Maternal Grandfather    Diabetes Paternal Grandmother    Heart attack Paternal Grandmother    Heart disease Paternal Grandmother    Asthma Paternal Grandmother    Angioedema Neg Hx    Eczema Neg Hx    Urticaria Neg Hx    Social History   Socioeconomic History   Marital status: Married    Spouse name: [name]   Number of children: Not on file   Years of education: Not on file   Highest education level: Not on file  Occupational History   Not on file  Tobacco Use   Smoking status: Former    Packs/day: 1.50    Years: 20.00    Additional pack years: 0.00    Total pack years: 30.00    Types: Cigarettes     Quit date: 06/03/2004    Years since quitting: 18.3   Smokeless tobacco: Never   Tobacco comments:    quit x 11 yrs.  Vaping Use   Vaping Use: Never used  Substance and Sexual Activity   Alcohol use: No   Drug use: No   Sexual activity: Yes    Partners: Male    Birth control/protection: Surgical  Other Topics Concern   Not on file  Social  History Narrative   Married.   Cares for great grandchildren during week.    Social Determinants of Health   Financial Resource Strain: Low Risk  (09/11/2021)   Overall Financial Resource Strain (CARDIA)    Difficulty of Paying Living Expenses: Not hard at all  Food Insecurity: No Food Insecurity (09/20/2022)   Hunger Vital Sign    Worried About Running Out of Food in the Last Year: Never true    Ran Out of Food in the Last Year: Never true  Transportation Needs: No Transportation Needs (09/20/2022)   PRAPARE - Administrator, Civil Service (Medical): No    Lack of Transportation (Non-Medical): No  Physical Activity: Sufficiently Active (09/20/2022)   Exercise Vital Sign    Days of Exercise per Week: 5 days    Minutes of Exercise per Session: 30 min  Stress: No Stress Concern Present (09/20/2022)   [employer] of Occupational Health - Occupational Stress Questionnaire    Feeling of Stress : Only a little  Social Connections: Socially Integrated (09/20/2022)   Social Connection and Isolation Panel [NHANES]    Frequency of Communication with Friends and Family: More than three times a week    Frequency of Social Gatherings with Friends and Family: More than three times a week    Attends Religious Services: More than 4 times per year    Active Member of [employer] or Organizations: Yes    Attends Engineer, structural: More than 4 times per year    Marital Status: Married    Tobacco Counseling Counseling given: Not Answered Tobacco comments: quit x 11 yrs.   Clinical Intake:  Pre-visit preparation completed:  Yes  Pain : No/denies pain  Diabetes: No  How often do you need to have someone help you when you read instructions, pamphlets, or other written materials from your doctor or pharmacy?: 1 - Never  Diabetic?No   Interpreter Needed?: No  Information entered by :: Kandis Fantasia LPN   Activities of Daily Living    09/20/2022   11:35 AM  In your present state of health, do you have any difficulty performing the following activities:  Hearing? 0  Vision? 0  Difficulty concentrating or making decisions? 0  Walking or climbing stairs? 0  Dressing or bathing? 0  Doing errands, shopping? 0  Preparing Food and eating ? N  Using the Toilet? N  In the past six months, have you accidently leaked urine? N  Do you have problems with loss of bowel control? N  Managing your Medications? N  Managing your Finances? N  Housekeeping or managing your Housekeeping? N    Patient Care Team: Babs Sciara, MD as PCP - General (Family Medicine) Regan Lemming, MD as PCP - Cardiology (Cardiology) Levert Feinstein, MD as Consulting Physician (Oncology) West Bali, MD (Inactive) as Consulting Physician (Gastroenterology) Lanelle Bal, DO as Consulting Physician (Internal Medicine)  Indicate any recent Medical Services you may have received from other than [facility] providers in the past year (date may be approximate).     Assessment:   This is a routine wellness examination for Anabella.  Hearing/Vision screen Hearing Screening - Comments:: Denies hearing difficulties   Vision Screening - Comments:: up to date with routine eye exams with [facility].    Dietary issues and exercise activities discussed: Current Exercise Habits: Home exercise routine, Type of exercise: walking, Time (Minutes): 30, Frequency (Times/Week): 5, Weekly Exercise (Minutes/Week): 150, Intensity: Mild   Goals Addressed  This Visit's Progress    Remain active and independent        COMPLETED: Weight (lb) < 200 lb (90.7 kg)   179 lb (81.2 kg)    Would like to down to 145-150#       Depression Screen    09/20/2022   11:34 AM 09/11/2021    3:07 PM 08/10/2020    9:05 AM 07/14/2020   10:58 AM 09/17/2019    9:44 AM 02/27/2017    4:32 PM 08/19/2016    1:44 PM  PHQ 2/9 Scores  PHQ - 2 Score 0 0 0 0 1 2 0  PHQ- 9 Score    Fall Risk    09/20/2022   11:35 AM 09/11/2021    3:15 PM 08/10/2020    9:05 AM 07/14/2020   10:01 AM 01/07/2020    9:45 AM  Fall Risk   Falls in the past year? 0 0 0 0 0  Number falls in past yr: 0 0     Injury with Fall? 0 0     Risk for fall due to : No Fall Risks No Fall Risks     Follow up Falls prevention discussed;Education provided;Falls evaluation completed Falls prevention discussed Falls evaluation completed Falls evaluation completed Falls evaluation completed    FALL RISK PREVENTION PERTAINING TO THE HOME:  Any stairs in or around the home? Yes  If so, are there any without handrails? No  Home free of loose throw rugs in walkways, pet beds, electrical cords, etc? Yes  Adequate lighting in your home to reduce risk of falls? Yes   ASSISTIVE DEVICES UTILIZED TO PREVENT FALLS:  Life alert? No  Use of a cane, walker or w/c? No  Grab bars in the bathroom? Yes  Shower chair or bench in shower? No  Elevated toilet seat or a handicapped toilet? Yes   TIMED UP AND GO:  Was the test performed? No . Telephonic visit   Cognitive Function:        09/20/2022   11:36 AM 09/11/2021    3:19 PM  6CIT Screen  What Year? 0 points 0 points  What month? 0 points 0 points  What time? 0 points 0 points  Count back from 20 0 points 0 points  Months in reverse 0 points 0 points  Repeat phrase 0 points 0 points  Total Score 0 points 0 points    Immunizations Immunization History  Administered Date(s) Administered   Fluad Quad(high Dose 65+) 03/15/2022   Influenza,inj,Quad PF,6+ Mos 04/01/2018   Influenza-Unspecified 02/28/2015,  03/30/2017, 03/26/2019   Moderna Sars-Covid-2 Vaccination 06/12/2019, 07/13/2019   Pneumococcal Conjugate-13 04/01/2018   Pneumococcal Polysaccharide-23 01/07/2020   Td 10/02/2011   Zoster Recombinat (Shingrix) 07/23/2018, 06/04/2019, 12/03/2019    TDAP status: Due, Education has been provided regarding the importance of this vaccine. Advised may receive this vaccine at local pharmacy or Health Dept. Aware to provide a copy of the vaccination record if obtained from local pharmacy or Health Dept. Verbalized acceptance and understanding.  Flu Vaccine status: Up to date  Pneumococcal vaccine status: Up to date  Covid-19 vaccine status: Information provided on how to obtain vaccines.   Qualifies for Shingles Vaccine? Yes   Zostavax completed No   Shingrix Completed?: Yes  Screening Tests Health Maintenance  Topic Date Due   Hepatitis C Screening  Never done   DTaP/Tdap/Td (2 - Tdap) 10/01/2021   MAMMOGRAM  07/28/2023 (Originally 02/10/2022)  COLONOSCOPY (Pts 45-40yrs Insurance coverage will need to be confirmed)  07/28/2023 (Originally 06/17/2022)   INFLUENZA VACCINE  01/02/2023   Medicare Annual Wellness (AWV)  09/20/2023   Pneumonia Vaccine 58+ Years old  Completed   DEXA SCAN  Completed   Zoster Vaccines- Shingrix  Completed   HPV VACCINES  Aged Out   COVID-19 Vaccine  Discontinued    Health Maintenance  Health Maintenance Due  Topic Date Due   Hepatitis C Screening  Never done   DTaP/Tdap/Td (2 - Tdap) 10/01/2021    Colorectal cancer screening: Type of screening: Colonoscopy. Completed 06/17/17. Repeat every 5 years  Mammogram status: Completed 02/11/20. Repeat every year  Bone Density status: Completed 02/11/20. Results reflect: Bone density results: OSTEOPOROSIS. Repeat every 2 years.  Lung Cancer Screening: (Low Dose CT Chest recommended if Age 57-80 years, 30 pack-year currently smoking OR have quit w/in 15years.) does not qualify.   Lung Cancer Screening  Referral: n/a   Additional Screening:  Hepatitis C Screening: does qualify;   Vision Screening: Recommended annual ophthalmology exams for early detection of glaucoma and other disorders of the eye. Is the patient up to date with their annual eye exam?  Yes  Who is the provider or what is the name of the office in which the patient attends annual eye exams? [facility]  If pt is not established with a provider, would they like to be referred to a provider to establish care? No .   Dental Screening: Recommended annual dental exams for proper oral hygiene  Community Resource Referral / Chronic Care Management: CRR required this visit?  No   CCM required this visit?  No      Plan:     I have personally reviewed and noted the following in the patient's chart:   Medical and social history Use of alcohol, tobacco or illicit drugs  Current medications and supplements including opioid prescriptions. Patient is not currently taking opioid prescriptions. Functional ability and status Nutritional status Physical activity Advanced directives List of other physicians Hospitalizations, surgeries, and ER visits in previous 12 months Vitals Screenings to include cognitive, depression, and falls Referrals and appointments  In addition, I have reviewed and discussed with patient certain preventive protocols, quality metrics, and best practice recommendations. A written personalized care plan for preventive services as well as general preventive health recommendations were provided to patient.     Durwin Nora, [place]   1/61/0960   Due to this being a virtual visit, the after visit summary with patients personalized plan was offered to patient via mail or my-chart.  Patient would like to access on my-chart  Nurse Notes: Patient is having leg pain mainly at night after having an active day.  Scheduled follow up visit with Eber Jones for August

## 2022-09-20 ENCOUNTER — Telehealth: Payer: Self-pay

## 2022-09-20 ENCOUNTER — Ambulatory Visit (INDEPENDENT_AMBULATORY_CARE_PROVIDER_SITE_OTHER): Payer: Medicare Other

## 2022-09-20 VITALS — Ht 64.5 in | Wt 179.0 lb

## 2022-09-20 DIAGNOSIS — Z0000 Encounter for general adult medical examination without abnormal findings: Secondary | ICD-10-CM | POA: Diagnosis not present

## 2022-09-20 DIAGNOSIS — Z1231 Encounter for screening mammogram for malignant neoplasm of breast: Secondary | ICD-10-CM

## 2022-09-20 NOTE — Telephone Encounter (Signed)
Patient seen for AWV and is having leg pain mainly at night after having an active day.  Scheduled follow up visit with Eber Jones for August. Please advise

## 2022-09-23 NOTE — Telephone Encounter (Signed)
Nurses-if the patient wants to be seen before then I would recommend scheduling a regular visit thank you

## 2022-09-25 NOTE — Telephone Encounter (Signed)
Patient advised of provider recommendations and verbalized understanding.   Patient wants to know if she can set up an appointment with Dr Lorin Picket as a consult to discuss her husband and everything that is going on with him

## 2022-09-26 NOTE — Telephone Encounter (Signed)
So please clarify with the wife.  Not sure if she wants an appointment by herself to talk with me about her husband or if she wants an appointment with her husband please read below then discussed with [name] then lets move forward from there (As you are aware in these type of situations certainly we are always more than willing to listen to family members tell us what they are noticing and discuss their concerns in more detail but a few things #1-I typically find it is better for the couple to come in and just be out in the open with it to discuss out in the open It is very hard to taking clinical information from 1 family member and tried to apply it to another family member without this being discussed openly-  #2-if [name] would like to come in more so to discuss what she is observing but does not want to have [name] present I would not be able to share any clinical information regarding him unless there is a proper release directly from [name] In this situation I would recommend scheduling appointment under [place] chart with the diagnosis of stress  Given all that is stated above nurses please handle the situation and go ahead with setting up either individual appointment or couple appointment within the course of the next 2 to 3 weeks-certainly take in what is going on if there is any emergent issues and the need to be seen sooner etc.

## 2022-09-27 NOTE — Telephone Encounter (Signed)
Patient wants an appointment by herself in person or by phone with you to discuss her husband- she said he would be aware of visit and she is on his papers for discussion but wants to discuss some thing and get your opinion

## 2022-09-27 NOTE — Telephone Encounter (Signed)
I would recommend setting up an appointment somewhere within the next 2 to 3 weeks Obviously if there is an emergent issue going on she needs to be upfront about that otherwise somewhere within the next 2 to 3 weeks Please let her know unfortunately schedule relatively full I would be perfectly fine doing this in person versus phone thank you

## 2022-09-27 NOTE — Telephone Encounter (Signed)
Patient notified and stated she would like to have an appointment in person and is currently driving but will call back to schedule appt when she gets home and can check her calendar.

## 2022-10-03 ENCOUNTER — Telehealth (INDEPENDENT_AMBULATORY_CARE_PROVIDER_SITE_OTHER): Payer: Medicare Other | Admitting: Family Medicine

## 2022-10-03 DIAGNOSIS — F439 Reaction to severe stress, unspecified: Secondary | ICD-10-CM

## 2022-10-03 DIAGNOSIS — R5383 Other fatigue: Secondary | ICD-10-CM

## 2022-10-03 DIAGNOSIS — G609 Hereditary and idiopathic neuropathy, unspecified: Secondary | ICD-10-CM

## 2022-10-03 DIAGNOSIS — E785 Hyperlipidemia, unspecified: Secondary | ICD-10-CM

## 2022-10-03 DIAGNOSIS — G2581 Restless legs syndrome: Secondary | ICD-10-CM

## 2022-10-03 DIAGNOSIS — M898X9 Other specified disorders of bone, unspecified site: Secondary | ICD-10-CM | POA: Diagnosis not present

## 2022-10-03 MED ORDER — ROPINIROLE HCL 0.5 MG PO TABS: 0.5000 mg | ORAL_TABLET | Freq: Every day | ORAL | 3 refills | Status: DC

## 2022-10-03 NOTE — Progress Notes (Signed)
Subjective:    Patient ID: [name], female    DOB: 1951-02-07, 72 y.o.   MRN: [id number]  HPI [name],you are scheduled for a virtual visit with your provider today.    Just as we do with appointments in the office, we must obtain your consent to participate.  Your consent will be active for this visit and any virtual visit you may have with one of our providers in the next 365 days.    If you have a MyChart account, I can also send a copy of this consent to you electronically.  All virtual visits are billed to your insurance company just like a traditional visit in the office.  As this is a virtual visit, video technology does not allow for your provider to perform a traditional examination.  This may limit your provider's ability to fully assess your condition.  If your provider identifies any concerns that need to be evaluated in person or the need to arrange testing such as labs, EKG, etc, we will make arrangements to do so.    Although advances in technology are sophisticated, we cannot ensure that it will always work on either your end or our end.  If the connection with a video visit is poor, we may have to switch to a telephone visit.  With either a video or telephone visit, we are not always able to ensure that we have a secure connection.   I need to obtain your verbal consent now.   Are you willing to proceed with your visit today?   [name] has provided verbal consent on 10/03/2022 for a virtual visit (video or telephone).   Patient would like to handle her stress with dealing wirh her husband. Patient states she is having issues with restless leg.    Virtual Visit via Video Note  I connected with [name] on 10/03/22 at 11:40 AM EDT by a video enabled telemedicine application and verified that I am speaking with the correct person using two identifiers.  Location: Patient: Home Provider: office   I discussed the limitations of evaluation and  management by telemedicine and the availability of in person appointments. The patient expressed understanding and agreed to proceed.  History of Present Illness:    Observations/Objective:   Assessment and Plan:   Follow Up Instructions:    I discussed the assessment and treatment plan with the patient. The patient was provided an opportunity to ask questions and all were answered. The patient agreed with the plan and demonstrated an understanding of the instructions.   The patient was advised to call back or seek an in-person evaluation if the symptoms worsen or if the condition fails to improve as anticipated.  I provided 25 minutes with the patient 30 minutes total with documentation minutes of non-face-to-face time during this encounter.   Lilyan Punt, MD    Review of Systems     Objective:   Physical Exam  Patient had virtual visit-video Appears to be in no distress Atraumatic Neuro able to relate and oriented No apparent resp distress Color normal  Outpatient Encounter Medications as of 10/03/2022  Medication Sig   Biotin 5000 MCG CAPS Take 5,000 mcg by mouth daily.   Carboxymethylcellulose Sodium (THERATEARS OP) Place 5 drops into both eyes daily.   fluticasone (FLONASE) 50 MCG/ACT nasal spray Place 2 sprays into both nostrils daily. Prn head congestion   montelukast (SINGULAIR) 10 MG tablet TAKE 1 TABLET (10 MG TOTAL) BY  MOUTH AT BEDTIME. FOR ALLERGIES AND ASTHMA   Multiple Vitamin (MULTIVITAMIN WITH MINERALS) TABS tablet Take 1 tablet by mouth daily.   OVER THE COUNTER MEDICATION B12 1000 mcg one daily   OVER THE COUNTER MEDICATION Zyzal 24 hr 5 mg daily   pantoprazole (PROTONIX) 40 MG tablet TAKE 1 TABLET BY MOUTH EVERY DAY AS NEEDED FOR ACID REFLUX   rizatriptan (MAXALT) 10 MG tablet Take one at onset of migraine; may repeat in 2 hours if needed; max 2 per 24 hours   rOPINIRole (REQUIP) 0.5 MG tablet Take 1 tablet (0.5 mg total) by mouth at bedtime.    sertraline (ZOLOFT) 50 MG tablet TAKE 1 TABLET BY MOUTH EVERY DAY   traZODone (DESYREL) 50 MG tablet TAKE 1/2 TO 1 TABLET BY MOUTH AT BEDTIME AS NEEDED FOR SLEEP.   triamcinolone cream (KENALOG) 0.1 % Apply 1 application. topically 2 (two) times daily. Prn rash; use up to 2 weeks   vitamin C (ASCORBIC ACID) 500 MG tablet Take 500 mg by mouth daily.    fexofenadine (ALLEGRA) 30 MG tablet Take 30 mg by mouth daily.    topiramate (TOPAMAX) 100 MG tablet Take 1 tablet (100 mg total) by mouth 2 (two) times daily. (Patient not taking: Reported on 10/03/2022)   No facility-administered encounter medications on file as of 10/03/2022.        Assessment & Plan:   1. Bone pain We will do x-rays but patient is coming with her husband next week we will do a limited exam at that time and order x-rays  2. Restless legs Requip patient has classic symptoms at nighttime start off at 0.5  3. Idiopathic peripheral neuropathy Given the neuropathy and the pain from it may benefit from Cymbalta or Lyrica but will do labs first  4. Other fatigue Moderate fatigue and tiredness probably related into significant stress she is under but it could be a sign of underlying illness check labs  5. Stress Under a lot of stress because her husband suffers with alcoholism and unfortunately is in very poor shape she denies being depressed just stressed she will be coming next week with her husband she will follow-up with [place] within 3 months

## 2022-10-04 ENCOUNTER — Other Ambulatory Visit: Payer: Self-pay

## 2022-10-04 DIAGNOSIS — E785 Hyperlipidemia, unspecified: Secondary | ICD-10-CM

## 2022-10-04 DIAGNOSIS — R5383 Other fatigue: Secondary | ICD-10-CM

## 2022-10-04 DIAGNOSIS — G2581 Restless legs syndrome: Secondary | ICD-10-CM

## 2022-10-04 DIAGNOSIS — M898X9 Other specified disorders of bone, unspecified site: Secondary | ICD-10-CM

## 2022-10-07 DIAGNOSIS — M898X9 Other specified disorders of bone, unspecified site: Secondary | ICD-10-CM | POA: Diagnosis not present

## 2022-10-07 DIAGNOSIS — R5383 Other fatigue: Secondary | ICD-10-CM | POA: Diagnosis not present

## 2022-10-07 DIAGNOSIS — E785 Hyperlipidemia, unspecified: Secondary | ICD-10-CM | POA: Diagnosis not present

## 2022-10-07 DIAGNOSIS — G2581 Restless legs syndrome: Secondary | ICD-10-CM | POA: Diagnosis not present

## 2022-10-08 LAB — CMP14+EGFR
BUN/Creatinine Ratio: 15 (ref 12–28)
CO2: 23 mmol/L (ref 20–29)

## 2022-10-08 LAB — IRON,TIBC AND FERRITIN PANEL

## 2022-10-08 LAB — IMMUNOFIXATION, SERUM: IgM (Immunoglobulin M), Srm: 70 mg/dL (ref 26–217)

## 2022-10-08 LAB — LIPID PANEL: HDL: 65 mg/dL (ref >39)

## 2022-10-10 LAB — IRON,TIBC AND FERRITIN PANEL
Ferritin: 90 ng/mL (ref 15–150)
Iron: 78 ug/dL (ref 27–139)

## 2022-10-10 LAB — CMP14+EGFR
AST: 22 IU/L (ref 0–40)
Alkaline Phosphatase: 64 IU/L (ref 44–121)
BUN: 15 mg/dL (ref 8–27)
Creatinine, Ser: 1.01 mg/dL — ABNORMAL HIGH (ref 0.57–1.00)
Globulin, Total: 2.6 g/dL (ref 1.5–4.5)

## 2022-10-11 LAB — CMP14+EGFR: Calcium: 9.1 mg/dL (ref 8.7–10.3)

## 2022-10-11 LAB — IRON,TIBC AND FERRITIN PANEL: Total Iron Binding Capacity: 335 ug/dL (ref 250–450)

## 2022-10-11 LAB — LIPID PANEL: Chol/HDL Ratio: 3.8 ratio (ref 0.0–4.4)

## 2022-10-15 LAB — CBC WITH DIFFERENTIAL/PLATELET
Basophils Absolute: 0.1 10*3/uL (ref 0.0–0.2)
Basos: 1 %
EOS (ABSOLUTE): 0.5 10*3/uL — ABNORMAL HIGH (ref 0.0–0.4)
Eos: 6 %
Hematocrit: 45.4 % (ref 34.0–46.6)
Hemoglobin: 14.5 g/dL (ref 11.1–15.9)
Immature Grans (Abs): 0 10*3/uL (ref 0.0–0.1)
Immature Granulocytes: 0 %
Lymphocytes Absolute: 3.4 10*3/uL — ABNORMAL HIGH (ref 0.7–3.1)
Lymphs: 42 %
MCH: 30.7 pg (ref 26.6–33.0)
MCHC: 31.9 g/dL (ref 31.5–35.7)
MCV: 96 fL (ref 79–97)
Monocytes Absolute: 0.7 10*3/uL (ref 0.1–0.9)
Monocytes: 8 %
Neutrophils Absolute: 3.6 10*3/uL (ref 1.4–7.0)
Neutrophils: 43 %
Platelets: 275 10*3/uL (ref 150–450)
RBC: 4.72 x10E6/uL (ref 3.77–5.28)
RDW: 13.5 % (ref 11.7–15.4)
WBC: 8.2 10*3/uL (ref 3.4–10.8)

## 2022-10-15 LAB — CMP14+EGFR
ALT: 18 IU/L (ref 0–32)
Albumin/Globulin Ratio: 1.6 (ref 1.2–2.2)
Albumin: 4.1 g/dL (ref 3.9–4.9)
Bilirubin Total: <0.2 mg/dL (ref 0.0–1.2)
Chloride: 107 mmol/L — ABNORMAL HIGH (ref 96–106)
Glucose: 85 mg/dL (ref 70–99)
Potassium: 4.6 mmol/L (ref 3.5–5.2)
Sodium: 144 mmol/L (ref 134–144)
Total Protein: 6.7 g/dL (ref 6.0–8.5)
eGFR: 60 mL/min/{1.73_m2} (ref >59)

## 2022-10-15 LAB — IMMUNOFIXATION, SERUM
IgA/Immunoglobulin A, Serum: 227 mg/dL (ref 87–352)
IgG (Immunoglobin G), Serum: 819 mg/dL (ref 586–1602)

## 2022-10-15 LAB — PE AND FLC, SERUM
A/G Ratio: 1.2 (ref 0.7–1.7)
Albumin ELP: 3.7 g/dL (ref 2.9–4.4)
Alpha 1: 0.3 g/dL (ref 0.0–0.4)
Alpha 2: 0.8 g/dL (ref 0.4–1.0)
Beta: 1.1 g/dL (ref 0.7–1.3)
Gamma Globulin: 0.8 g/dL (ref 0.4–1.8)
Globulin, Total: 3 g/dL (ref 2.2–3.9)
Ig Kappa Free Light Chain: 29.7 mg/L — ABNORMAL HIGH (ref 3.3–19.4)
Ig Lambda Free Light Chain: 21.9 mg/L (ref 5.7–26.3)
KAPPA/LAMBDA RATIO: 1.36 (ref 0.26–1.65)

## 2022-10-15 LAB — LIPID PANEL
Cholesterol, Total: 245 mg/dL — ABNORMAL HIGH (ref 100–199)
LDL Chol Calc (NIH): 149 mg/dL — ABNORMAL HIGH (ref 0–99)
Triglycerides: 172 mg/dL — ABNORMAL HIGH (ref 0–149)
VLDL Cholesterol Cal: 31 mg/dL (ref 5–40)

## 2022-10-15 LAB — IRON,TIBC AND FERRITIN PANEL: UIBC: 257 ug/dL (ref 118–369)

## 2022-10-18 NOTE — Progress Notes (Signed)
Remote pacemaker transmission.   

## 2022-11-27 DIAGNOSIS — L570 Actinic keratosis: Secondary | ICD-10-CM | POA: Diagnosis not present

## 2022-11-27 DIAGNOSIS — D485 Neoplasm of uncertain behavior of skin: Secondary | ICD-10-CM | POA: Diagnosis not present

## 2022-12-01 ENCOUNTER — Telehealth: Payer: Self-pay | Admitting: Family Medicine

## 2022-12-01 NOTE — Telephone Encounter (Signed)
Nurses Please call [facility] when they were here in May we were discussing her health issues and her husband's but due to his health issues he had to go to the ER-never really had the chance to have closure on our discussion regarding their lab work.  I would like to do a phone visit with them at the end of the day either Monday or Tuesday or Friday-there is no need to do this as an office visit there would be no charge just a discussion of their lab work and follow-up questions they may have  Please connect with her see if she and her husband are willing to do a phone visit as discussed above thank you  Let me know

## 2022-12-02 NOTE — Telephone Encounter (Signed)
Left message to return call 

## 2022-12-10 ENCOUNTER — Ambulatory Visit (INDEPENDENT_AMBULATORY_CARE_PROVIDER_SITE_OTHER): Payer: Medicare Other

## 2022-12-10 DIAGNOSIS — I442 Atrioventricular block, complete: Secondary | ICD-10-CM

## 2022-12-11 LAB — CUP PACEART REMOTE DEVICE CHECK
Battery Remaining Longevity: 34 mo
Battery Remaining Percentage: 26 %
Battery Voltage: 2.93 V
Brady Statistic AP VP Percent: 1 %
Brady Statistic AP VS Percent: 0 %
Brady Statistic AS VP Percent: 99 %
Brady Statistic AS VS Percent: 1 %
Brady Statistic RA Percent Paced: 1 %
Brady Statistic RV Percent Paced: 99 %
Date Time Interrogation Session: 20240709054502
Implantable Lead Connection Status: 753985
Implantable Lead Connection Status: 753985
Implantable Lead Implant Date: 20170418
Implantable Lead Implant Date: 20170418
Implantable Lead Location: 753859
Implantable Lead Location: 753860
Implantable Pulse Generator Implant Date: 20170418
Lead Channel Impedance Value: 530 Ohm
Lead Channel Impedance Value: 580 Ohm
Lead Channel Pacing Threshold Amplitude: 0.5 V
Lead Channel Pacing Threshold Amplitude: 0.875 V
Lead Channel Pacing Threshold Pulse Width: 0.5 ms
Lead Channel Pacing Threshold Pulse Width: 0.5 ms
Lead Channel Sensing Intrinsic Amplitude: 5 mV
Lead Channel Sensing Intrinsic Amplitude: 7.5 mV
Lead Channel Setting Pacing Amplitude: 1.125
Lead Channel Setting Pacing Amplitude: 2 V
Lead Channel Setting Pacing Pulse Width: 0.5 ms
Lead Channel Setting Sensing Sensitivity: 2 mV
Pulse Gen Model: 2272
Pulse Gen Serial Number: 7882213

## 2022-12-11 NOTE — Telephone Encounter (Signed)
Patient said someone called her Friday and was asked to call the office no message or name. I seen that Autumn LVM for her on July 1 can someone call this patient back   [place] Supinski-[phone]

## 2022-12-11 NOTE — Telephone Encounter (Signed)
Patient stated they would be available Friday at lunch time and the best number to call is [phone]

## 2022-12-13 ENCOUNTER — Other Ambulatory Visit: Payer: Self-pay | Admitting: Family Medicine

## 2022-12-13 MED ORDER — ROPINIROLE HCL 0.5 MG PO TABS: ORAL_TABLET | ORAL | 5 refills | Status: DC

## 2022-12-13 NOTE — Telephone Encounter (Signed)
Long discussion held with [name] she is having some trouble with restless legs bothering her a lot at nighttime we will send in a new prescription for Requip 1 taken near 730 or 8 PM and another half a tablet near bedtime the other half tablet may be taken in the middle of the night if she wakes up with restless legs she will do labs and a follow-up visit with Eber Jones in October  I will send in the Requip  Front-please change her appointment from August into October make appointment with Eber Jones and send notification to the patient thank you this is patient's request

## 2022-12-16 ENCOUNTER — Other Ambulatory Visit: Payer: Self-pay

## 2022-12-16 DIAGNOSIS — R768 Other specified abnormal immunological findings in serum: Secondary | ICD-10-CM

## 2022-12-16 DIAGNOSIS — E785 Hyperlipidemia, unspecified: Secondary | ICD-10-CM

## 2022-12-18 ENCOUNTER — Other Ambulatory Visit: Payer: Self-pay

## 2022-12-23 DIAGNOSIS — H5203 Hypermetropia, bilateral: Secondary | ICD-10-CM | POA: Diagnosis not present

## 2022-12-23 DIAGNOSIS — H52223 Regular astigmatism, bilateral: Secondary | ICD-10-CM | POA: Diagnosis not present

## 2022-12-23 DIAGNOSIS — H2513 Age-related nuclear cataract, bilateral: Secondary | ICD-10-CM | POA: Diagnosis not present

## 2022-12-23 DIAGNOSIS — H524 Presbyopia: Secondary | ICD-10-CM | POA: Diagnosis not present

## 2022-12-29 ENCOUNTER — Other Ambulatory Visit: Payer: Self-pay | Admitting: Family Medicine

## 2023-01-02 NOTE — Progress Notes (Signed)
Remote pacemaker transmission.   

## 2023-01-06 DIAGNOSIS — H02831 Dermatochalasis of right upper eyelid: Secondary | ICD-10-CM | POA: Diagnosis not present

## 2023-01-06 DIAGNOSIS — H01001 Unspecified blepharitis right upper eyelid: Secondary | ICD-10-CM | POA: Diagnosis not present

## 2023-01-06 DIAGNOSIS — H40013 Open angle with borderline findings, low risk, bilateral: Secondary | ICD-10-CM | POA: Diagnosis not present

## 2023-01-06 DIAGNOSIS — H02834 Dermatochalasis of left upper eyelid: Secondary | ICD-10-CM | POA: Diagnosis not present

## 2023-01-06 DIAGNOSIS — H25813 Combined forms of age-related cataract, bilateral: Secondary | ICD-10-CM | POA: Diagnosis not present

## 2023-01-14 ENCOUNTER — Other Ambulatory Visit: Payer: Self-pay | Admitting: Nurse Practitioner

## 2023-01-14 MED ORDER — PANTOPRAZOLE SODIUM 40 MG PO TBEC: DELAYED_RELEASE_TABLET | ORAL | 0 refills | Status: DC

## 2023-01-23 DIAGNOSIS — H25811 Combined forms of age-related cataract, right eye: Secondary | ICD-10-CM | POA: Diagnosis not present

## 2023-01-24 ENCOUNTER — Ambulatory Visit: Payer: Medicare Other | Admitting: Nurse Practitioner

## 2023-01-24 NOTE — H&P (Signed)
Surgical History & Physical  Patient Name: Laketra Tvedt  DOB: 11/21/1950  Surgery: Cataract extraction with intraocular lens implant phacoemulsification; Right Eye Surgeon: Fabio Pierce MD Surgery Date: 01/31/2023 Pre-Op Date: 01/06/2023  HPI: A 43 Yr. old female patient 1. The patient was referred to [place] from Dr. Daphine Deutscher for a cataract evaluation. The patient's vision is blurry. The condition's severity is constant. The complaint is associated with difficulty reading small print on medicine bottles/labels, difficulty driving at night due to halos/glare, and difficulty reading captions on a tv. This is negatively affecting the patient's quality of life and the patient is unable to function adequately in life with the current level of vision. HPI was performed by Fabio Pierce .  Medical History: Glaucoma  Review of Systems Negative Allergic/Immunologic Negative Cardiovascular Negative Constitutional Negative Ear, Nose, Mouth & Throat Negative Endocrine Negative Eyes Negative Gastrointestinal Negative Genitourinary Negative Hemotologic/Lymphatic Negative Integumentary Negative Musculoskeletal Negative Neurological Negative Psychiatry Negative Respiratory  Social Never smoked   Medication ropinirole ,  Topamax ,  sertraline tablet   Sx/Procedures Pacemaker  Drug Allergies  Ambien   History & Physical: Heent: cataracts NECK: supple without bruits LUNGS: lungs clear to auscultation CV: regular rate and rhythm Abdomen: soft and non-tender  Impression & Plan: Assessment: 1.  COMBINED FORMS AGE RELATED CATARACT; Both Eyes (H25.813) 2.  DERMATOCHALASIS, no surgery; Right Upper Lid, Left Upper Lid (H02.831, H02.834) 3.  BLEPHARITIS; Right Upper Lid, Right Lower Lid, Left Upper Lid, Left Lower Lid (H01.001, H01.002,H01.004,H01.005) 4.  Pinguecula; Both Eyes (H11.153) 5.  OAG BORDERLINE FINDINGS LOW RISK; Both Eyes (H40.013)  Plan: 1.  Cataract accounts for the  patient's decreased vision. This visual impairment is not correctable with a tolerable change in glasses or contact lenses. Cataract surgery with an implantation of a new lens should significantly improve the visual and functional status of the patient. Discussed all risks, benefits, alternatives, and potential complications. Discussed the procedures and recovery. Patient desires to have surgery. A-scan ordered and performed today for intra-ocular lens calculations. The surgery will be performed in order to improve vision for driving, reading, and for eye examinations. Recommend phacoemulsification with intra-ocular lens. Recommend Dextenza for post-operative pain and inflammation. Right Eye. Dilates well - shugarcaine by protocol.  2.  Asymptomatic, recommend observation for now. Findings, prognosis and treatment options reviewed.  3.  Blepharitis is present - recommend regular lid cleaning.  4.  Observe; Artificial tears as needed for irritation.  5.  Based on cup-to-disc ratio. Positive family history. OCT rNFL shows: WNL OU. 8/24 IOPs WNL OU today. Monitor. Detailed discussion about glaucoma today including importance of maintaining good follow up and following treatment plan, and the possibility of irreversible blindness as part of this disease process.

## 2023-01-27 ENCOUNTER — Encounter (HOSPITAL_COMMUNITY)
Admission: RE | Admit: 2023-01-27 | Discharge: 2023-01-27 | Disposition: A | Discharge status: Home / Self Care | Payer: Medicare Other | Source: Ambulatory Visit | Attending: Ophthalmology | Admitting: Ophthalmology

## 2023-01-27 HISTORY — DX: Unspecified asthma, uncomplicated: J45.909

## 2023-01-28 ENCOUNTER — Encounter (HOSPITAL_COMMUNITY): Payer: Self-pay

## 2023-01-31 ENCOUNTER — Ambulatory Visit (HOSPITAL_COMMUNITY): Payer: Medicare Other | Admitting: Anesthesiology

## 2023-01-31 ENCOUNTER — Ambulatory Visit (HOSPITAL_BASED_OUTPATIENT_CLINIC_OR_DEPARTMENT_OTHER): Payer: Medicare Other | Admitting: Anesthesiology

## 2023-01-31 ENCOUNTER — Ambulatory Visit (HOSPITAL_COMMUNITY)
Admission: RE | Admit: 2023-01-31 | Discharge: 2023-01-31 | Disposition: A | Discharge status: Home / Self Care | Payer: Medicare Other | Attending: Ophthalmology | Admitting: Ophthalmology

## 2023-01-31 ENCOUNTER — Encounter (HOSPITAL_COMMUNITY): Payer: Self-pay | Admitting: Ophthalmology

## 2023-01-31 ENCOUNTER — Encounter (HOSPITAL_COMMUNITY)
Admission: RE | Disposition: A | Discharge status: Home / Self Care | Payer: Self-pay | Source: Home / Self Care | Attending: Ophthalmology

## 2023-01-31 DIAGNOSIS — Z87891 Personal history of nicotine dependence: Secondary | ICD-10-CM | POA: Diagnosis not present

## 2023-01-31 DIAGNOSIS — H25811 Combined forms of age-related cataract, right eye: Secondary | ICD-10-CM | POA: Diagnosis not present

## 2023-01-31 DIAGNOSIS — H11153 Pinguecula, bilateral: Secondary | ICD-10-CM | POA: Diagnosis not present

## 2023-01-31 DIAGNOSIS — I499 Cardiac arrhythmia, unspecified: Secondary | ICD-10-CM | POA: Diagnosis not present

## 2023-01-31 DIAGNOSIS — H02831 Dermatochalasis of right upper eyelid: Secondary | ICD-10-CM | POA: Diagnosis not present

## 2023-01-31 DIAGNOSIS — H0100A Unspecified blepharitis right eye, upper and lower eyelids: Secondary | ICD-10-CM | POA: Insufficient documentation

## 2023-01-31 DIAGNOSIS — H40013 Open angle with borderline findings, low risk, bilateral: Secondary | ICD-10-CM | POA: Insufficient documentation

## 2023-01-31 DIAGNOSIS — G4730 Sleep apnea, unspecified: Secondary | ICD-10-CM | POA: Diagnosis not present

## 2023-01-31 DIAGNOSIS — H0100B Unspecified blepharitis left eye, upper and lower eyelids: Secondary | ICD-10-CM | POA: Insufficient documentation

## 2023-01-31 DIAGNOSIS — J45909 Unspecified asthma, uncomplicated: Secondary | ICD-10-CM

## 2023-01-31 DIAGNOSIS — Z950 Presence of cardiac pacemaker: Secondary | ICD-10-CM | POA: Insufficient documentation

## 2023-01-31 DIAGNOSIS — F418 Other specified anxiety disorders: Secondary | ICD-10-CM

## 2023-01-31 DIAGNOSIS — H02834 Dermatochalasis of left upper eyelid: Secondary | ICD-10-CM | POA: Insufficient documentation

## 2023-01-31 DIAGNOSIS — H25813 Combined forms of age-related cataract, bilateral: Secondary | ICD-10-CM | POA: Insufficient documentation

## 2023-01-31 HISTORY — PX: CATARACT EXTRACTION W/PHACO: SHX586

## 2023-01-31 SURGERY — PHACOEMULSIFICATION, CATARACT, WITH IOL INSERTION
Anesthesia: Monitor Anesthesia Care | Site: Eye | Laterality: Right

## 2023-01-31 MED ORDER — SODIUM HYALURONATE 23MG/ML IO SOSY
PREFILLED_SYRINGE | INTRAOCULAR | Status: DC | PRN
  Administered 2023-01-31: .6 mL via INTRAOCULAR

## 2023-01-31 MED ORDER — LIDOCAINE HCL 3.5 % OP GEL: 1.0000 | Freq: Once | OPHTHALMIC | Status: DC

## 2023-01-31 MED ORDER — POVIDONE-IODINE 5 % OP SOLN
OPHTHALMIC | Status: DC | PRN
  Administered 2023-01-31: 1 via OPHTHALMIC

## 2023-01-31 MED ORDER — LIDOCAINE HCL (PF) 1 % IJ SOLN
INTRAOCULAR | Status: DC | PRN
  Administered 2023-01-31: 1 mL via OPHTHALMIC

## 2023-01-31 MED ORDER — MIDAZOLAM HCL 2 MG/2ML IJ SOLN
INTRAMUSCULAR | Status: DC | PRN
  Administered 2023-01-31: 2 mg via INTRAVENOUS

## 2023-01-31 MED ORDER — MIDAZOLAM HCL 2 MG/2ML IJ SOLN
INTRAMUSCULAR | Status: AC
  Filled 2023-01-31: qty 2

## 2023-01-31 MED ORDER — PHENYLEPHRINE HCL 2.5 % OP SOLN
1.0000 [drp] | OPHTHALMIC | Status: AC | PRN
  Administered 2023-01-31 (×3): 1 [drp] via OPHTHALMIC

## 2023-01-31 MED ORDER — BSS IO SOLN
INTRAOCULAR | Status: DC | PRN
  Administered 2023-01-31: 15 mL via INTRAOCULAR

## 2023-01-31 MED ORDER — SODIUM CHLORIDE 0.9% FLUSH
INTRAVENOUS | Status: DC | PRN
  Administered 2023-01-31: 3 mL via INTRAVENOUS

## 2023-01-31 MED ORDER — EPINEPHRINE PF 1 MG/ML IJ SOLN
INTRAOCULAR | Status: DC | PRN
  Administered 2023-01-31: 500 mL

## 2023-01-31 MED ORDER — TETRACAINE HCL 0.5 % OP SOLN
1.0000 [drp] | OPHTHALMIC | Status: AC | PRN
  Administered 2023-01-31 (×3): 1 [drp] via OPHTHALMIC

## 2023-01-31 MED ORDER — STERILE WATER FOR IRRIGATION IR SOLN
Status: DC | PRN
  Administered 2023-01-31: 1

## 2023-01-31 MED ORDER — TROPICAMIDE 1 % OP SOLN
1.0000 [drp] | OPHTHALMIC | Status: AC | PRN
  Administered 2023-01-31 (×3): 1 [drp] via OPHTHALMIC

## 2023-01-31 MED ORDER — SODIUM HYALURONATE 10 MG/ML IO SOLUTION
PREFILLED_SYRINGE | INTRAOCULAR | Status: DC | PRN
  Administered 2023-01-31: .85 mL via INTRAOCULAR

## 2023-01-31 MED ORDER — MOXIFLOXACIN HCL 5 MG/ML IO SOLN
INTRAOCULAR | Status: DC | PRN
  Administered 2023-01-31: .3 mL via OPHTHALMIC

## 2023-01-31 SURGICAL SUPPLY — 13 items
CATARACT SUITE SIGHTPATH (MISCELLANEOUS) ×1
CLOTH BEACON ORANGE TIMEOUT ST (SAFETY) ×2 IMPLANT
EYE SHIELD UNIVERSAL CLEAR (GAUZE/BANDAGES/DRESSINGS) IMPLANT
FEE CATARACT SUITE SIGHTPATH (MISCELLANEOUS) ×2 IMPLANT
GLOVE BIOGEL PI IND STRL 7.0 (GLOVE) ×4 IMPLANT
LENS IOL TECNIS EYHANCE 21.5 (Intraocular Lens) IMPLANT
NDL HYPO 18GX1.5 BLUNT FILL (NEEDLE) ×2 IMPLANT
NEEDLE HYPO 18GX1.5 BLUNT FILL (NEEDLE) ×1
PAD ARMBOARD 7.5X6 YLW CONV (MISCELLANEOUS) ×2 IMPLANT
POSITIONER HEAD 8X9X4 ADT (SOFTGOODS) ×2 IMPLANT
SYR TB 1ML LL NO SAFETY (SYRINGE) ×2 IMPLANT
TAPE SURG TRANSPORE 1 IN (GAUZE/BANDAGES/DRESSINGS) IMPLANT
WATER STERILE IRR 250ML POUR (IV SOLUTION) ×2 IMPLANT

## 2023-01-31 NOTE — Anesthesia Postprocedure Evaluation (Signed)
Anesthesia Post Note  Patient: [name]  Procedure(s) Performed: CATARACT EXTRACTION PHACO AND INTRAOCULAR LENS PLACEMENT (IOC) (Right: Eye)  Patient location during evaluation: Phase II Anesthesia Type: MAC Level of consciousness: awake Pain management: pain level controlled Vital Signs Assessment: post-procedure vital signs reviewed and stable Respiratory status: spontaneous breathing and respiratory function stable Cardiovascular status: blood pressure returned to baseline and stable Postop Assessment: no headache and no apparent nausea or vomiting Anesthetic complications: no Comments: Late entry   No notable events documented.   Last Vitals:  Vitals:   01/31/23 0722 01/31/23 0842  BP: (!) 120/57 107/60  Pulse: 67 63  Resp:  16  Temp: 36.6 C 36.4 C  SpO2: 98% 100%    Last Pain:  Vitals:   01/31/23 0842  TempSrc: Oral  PainSc: 0-No pain                 [name]

## 2023-01-31 NOTE — Transfer of Care (Signed)
Immediate Anesthesia Transfer of Care Note  Patient: [name]  Procedure(s) Performed: CATARACT EXTRACTION PHACO AND INTRAOCULAR LENS PLACEMENT (IOC) (Right: Eye)  Patient Location: Short Stay  Anesthesia Type:MAC  Level of Consciousness: awake, alert , oriented, and patient cooperative  Airway & Oxygen Therapy: Patient Spontanous Breathing  Post-op Assessment: Report given to RN, Post -op Vital signs reviewed and stable, and Patient moving all extremities X 4  Post vital signs: Reviewed and stable  Last Vitals:  Vitals Value Taken Time  BP 107/60 01/31/23 0842  Temp 36.4 C 01/31/23 0842  Pulse 63 01/31/23 0842  Resp 16 01/31/23 0842  SpO2 100 % 01/31/23 0842    Last Pain:  Vitals:   01/31/23 0842  TempSrc: Oral  PainSc: 0-No pain      Patients Stated Pain Goal: 6 (01/31/23 0722)  Complications: No notable events documented.

## 2023-01-31 NOTE — Interval H&P Note (Signed)
History and Physical Interval Note:  01/31/2023 8:19 AM  [name]  has presented today for surgery, with the diagnosis of combined forms age related cataract, right eye.  The various methods of treatment have been discussed with the patient and family. After consideration of risks, benefits and other options for treatment, the patient has consented to  Procedure(s): CATARACT EXTRACTION PHACO AND INTRAOCULAR LENS PLACEMENT (IOC) (Right) as a surgical intervention.  The patient's history has been reviewed, patient examined, no change in status, stable for surgery.  I have reviewed the patient's chart and labs.  Questions were answered to the patient's satisfaction.     Fabio Pierce

## 2023-01-31 NOTE — Anesthesia Preprocedure Evaluation (Addendum)
Anesthesia Evaluation  Patient identified by MRN, date of birth, ID band Patient awake    Reviewed: Allergy & Precautions, H&P , NPO status , Patient's Chart, lab work & pertinent test results, reviewed documented beta blocker date and time   History of Anesthesia Complications (+) PONV and history of anesthetic complications  Airway Mallampati: II  TM Distance: >3 FB Neck ROM: full    Dental no notable dental hx.    Pulmonary shortness of breath, asthma , sleep apnea , former smoker   Pulmonary exam normal breath sounds clear to auscultation       Cardiovascular Exercise Tolerance: Good negative cardio ROS + dysrhythmias + pacemaker  Rhythm:regular Rate:Normal     Neuro/Psych  Headaches PSYCHIATRIC DISORDERS Anxiety Depression     Neuromuscular disease negative neurological ROS  negative psych ROS   GI/Hepatic negative GI ROS, Neg liver ROS,GERD  ,,  Endo/Other  negative endocrine ROS    Renal/GU negative Renal ROS  negative genitourinary   Musculoskeletal   Abdominal   Peds  Hematology negative hematology ROS (+)   Anesthesia Other Findings   Reproductive/Obstetrics negative OB ROS                             Anesthesia Physical Anesthesia Plan  ASA: 3  Anesthesia Plan: MAC   Post-op Pain Management:    Induction:   PONV Risk Score and Plan:   Airway Management Planned:   Additional Equipment:   Intra-op Plan:   Post-operative Plan:   Informed Consent: I have reviewed the patients History and Physical, chart, labs and discussed the procedure including the risks, benefits and alternatives for the proposed anesthesia with the patient or authorized representative who has indicated his/her understanding and acceptance.     Dental Advisory Given  Plan Discussed with: CRNA  Anesthesia Plan Comments:        Anesthesia Quick Evaluation

## 2023-01-31 NOTE — Discharge Instructions (Signed)
Please discharge patient when stable, will follow up today with Dr. Wrzosek at the [facility] office immediately following discharge.  Leave shield in place until visit.  All paperwork with discharge instructions will be given at the office.  [facility] Address:  [address]  [place], [place] [address]  

## 2023-01-31 NOTE — Op Note (Signed)
Date of procedure: 01/31/23  Pre-operative diagnosis:  Visually significant combined form age-related cataract, Right Eye (H25.811)  Post-operative diagnosis:  Visually significant combined form age-related cataract, Right Eye (H25.811)  Procedure: Removal of cataract via phacoemulsification and insertion of intra-ocular lens Laural Benes and Johnson DIB00 +21.5D into the capsular bag of the Right Eye  Attending surgeon: Rudy Jew. Riko Lumsden, MD, MA  Anesthesia: MAC, Topical Akten  Complications: None  Estimated Blood Loss: <12mL (minimal)  Specimens: None  Implants: As above  Indications:  Visually significant age-related cataract, Right Eye  Procedure:  The patient was seen and identified in the pre-operative area. The operative eye was identified and dilated.  The operative eye was marked.  Topical anesthesia was administered to the operative eye.     The patient was then to the operative suite and placed in the supine position.  A timeout was performed confirming the patient, procedure to be performed, and all other relevant information.   The patient's face was prepped and draped in the usual fashion for intra-ocular surgery.  A lid speculum was placed into the operative eye and the surgical microscope moved into place and focused.  A superotemporal paracentesis was created using a 20 gauge paracentesis blade.  Shugarcaine was injected into the anterior chamber.  Viscoelastic was injected into the anterior chamber.  A temporal clear-corneal main wound incision was created using a 2.29mm microkeratome.  A continuous curvilinear capsulorrhexis was initiated using an irrigating cystitome and completed using capsulorrhexis forceps.  Hydrodissection and hydrodeliniation were performed.  Viscoelastic was injected into the anterior chamber.  A phacoemulsification handpiece and a chopper as a second instrument were used to remove the nucleus and epinucleus. The irrigation/aspiration handpiece was used to  remove any remaining cortical material.   The capsular bag was reinflated with viscoelastic, checked, and found to be intact.  The intraocular lens was inserted into the capsular bag.  The irrigation/aspiration handpiece was used to remove any remaining viscoelastic.  The clear corneal wound and paracentesis wounds were then hydrated and checked with Weck-Cels to be watertight. 0.26mL of Moxfloxacin was injected into the anterior chamber. The lid-speculum was removed.  The drape was removed.  The patient's face was cleaned with a wet and dry 4x4. A clear shield was taped over the eye. The patient was taken to the post-operative care unit in good condition, having tolerated the procedure well.  Post-Op Instructions: The patient will follow up at [facility] for a same day post-operative evaluation and will receive all other orders and instructions.

## 2023-02-04 ENCOUNTER — Encounter (HOSPITAL_COMMUNITY): Payer: Self-pay | Admitting: Ophthalmology

## 2023-02-04 ENCOUNTER — Other Ambulatory Visit: Payer: Self-pay | Admitting: Family Medicine

## 2023-02-17 DIAGNOSIS — H25812 Combined forms of age-related cataract, left eye: Secondary | ICD-10-CM | POA: Diagnosis not present

## 2023-02-19 NOTE — H&P (Signed)
Surgical History & Physical  Patient Name: [name]  DOB: 07-04-1956  Surgery: Cataract extraction with intraocular lens implant phacoemulsification; Left Eye Surgeon: Fabio Pierce MD Surgery Date: 02/24/2023 Pre-Op Date: 01/30/2023  HPI: A 72 Yr. old female patient 1. The patient complains of difficulty when reading closed caption on TV, seeing road signs. Difficulties with glare on bright sunny days. OS>OD. This is negatively affecting the patient's quality of life and the patient is unable to function adequately in life with the current level of vision. The episode is constant. The condition's severity is worsening. This is negatively affecting the patient's quality of life and the patient is unable to function adequately in life with the current level of vision. Patient is driving today. HPI Completed by Dr. Fabio Pierce  Medical History: Cataracts Macula Degeneration Glaucoma  Anxiety, Depressive disorder Arthritis Diabetes LDL Lung Problems Stroke  Review of Systems Endocrine diabetic Musculoskeletal arthritis Neurological Stroke Psychiatry Anxiety, Depression Respiratory COPD All recorded systems are negative except as noted above.  Social Current every day smoker of Cigarettes   Medication Caltrate with Vit. D3 oral, Lisinopril, Metformin, Oxycodone, Pravastatin,  pravastatin ,  SPS (with sorbitol) ,  citalopram ,  lisinopril ,  oxycodone ,  trazodone ,  pantoprazole ,  glipizide ,  diclofenac sodium ,  Ventolin HFA ,  Linzess ,  Trelegy Ellipta   Sx/Procedures Back Surgery  Drug Allergies  NKDA  History & Physical: Heent: cataracts NECK: supple without bruits LUNGS: lungs clear to auscultation CV: regular rate and rhythm Abdomen: soft and non-tender  Impression & Plan: Assessment: 1.  COMBINED FORMS AGE RELATED CATARACT; Both Eyes (H25.813) 2.  BLEPHARITIS; Right Upper Lid, Right Lower Lid, Left Upper Lid, Left Lower Lid (H01.001,  H01.002,H01.004,H01.005) 3.  Pinguecula; Both Eyes (H11.153) 4.  DERMATOCHALASIS; Right Upper Lid, Left Upper Lid (H02.831, B14.782)  Plan: 1.  Cataract accounts for the patient's decreased vision. This visual impairment is not correctable with a tolerable change in glasses or contact lenses. Cataract surgery with an implantation of a new lens should significantly improve the visual and functional status of the patient. Discussed all risks, benefits, alternatives, and potential complications. Discussed the procedures and recovery. Patient desires to have surgery. A-scan ordered and performed today for intra-ocular lens calculations. The surgery will be performed in order to improve vision for driving, reading, and for eye examinations. Recommend phacoemulsification with intra-ocular lens. Recommend Dextenza for post-operative pain and inflammation. Left Eye worse - first. Dilates poorly - shugacaine by protocol. Malyugin Ring. Omidira.  2.  Blepharitis is present - recommend regular lid cleaning.  3.  Observe; Artificial tears as needed for irritation.  4.  Asymptomatic, recommend observation for now. Findings, prognosis and treatment options reviewed.

## 2023-02-20 ENCOUNTER — Other Ambulatory Visit: Payer: Self-pay

## 2023-02-20 ENCOUNTER — Encounter (HOSPITAL_COMMUNITY): Payer: Self-pay

## 2023-02-20 ENCOUNTER — Encounter (HOSPITAL_COMMUNITY)
Admission: RE | Admit: 2023-02-20 | Discharge: 2023-02-20 | Disposition: A | Discharge status: Home / Self Care | Payer: Medicare Other | Source: Ambulatory Visit | Attending: Ophthalmology | Admitting: Ophthalmology

## 2023-02-24 ENCOUNTER — Other Ambulatory Visit: Payer: Self-pay

## 2023-02-24 ENCOUNTER — Encounter (HOSPITAL_COMMUNITY): Payer: Self-pay | Admitting: Ophthalmology

## 2023-02-24 ENCOUNTER — Ambulatory Visit (HOSPITAL_COMMUNITY): Payer: Medicare Other | Admitting: Certified Registered"

## 2023-02-24 ENCOUNTER — Ambulatory Visit (HOSPITAL_BASED_OUTPATIENT_CLINIC_OR_DEPARTMENT_OTHER): Payer: Medicare Other | Admitting: Certified Registered"

## 2023-02-24 ENCOUNTER — Encounter (HOSPITAL_COMMUNITY)
Admission: RE | Disposition: A | Discharge status: Home / Self Care | Payer: Self-pay | Source: Home / Self Care | Attending: Ophthalmology

## 2023-02-24 ENCOUNTER — Ambulatory Visit (HOSPITAL_COMMUNITY)
Admission: RE | Admit: 2023-02-24 | Discharge: 2023-02-24 | Disposition: A | Discharge status: Home / Self Care | Payer: Medicare Other | Attending: Ophthalmology | Admitting: Ophthalmology

## 2023-02-24 DIAGNOSIS — F32A Depression, unspecified: Secondary | ICD-10-CM | POA: Diagnosis not present

## 2023-02-24 DIAGNOSIS — G4730 Sleep apnea, unspecified: Secondary | ICD-10-CM | POA: Insufficient documentation

## 2023-02-24 DIAGNOSIS — E1136 Type 2 diabetes mellitus with diabetic cataract: Secondary | ICD-10-CM | POA: Insufficient documentation

## 2023-02-24 DIAGNOSIS — H25812 Combined forms of age-related cataract, left eye: Secondary | ICD-10-CM

## 2023-02-24 DIAGNOSIS — Z87891 Personal history of nicotine dependence: Secondary | ICD-10-CM | POA: Diagnosis not present

## 2023-02-24 DIAGNOSIS — I442 Atrioventricular block, complete: Secondary | ICD-10-CM | POA: Insufficient documentation

## 2023-02-24 DIAGNOSIS — H409 Unspecified glaucoma: Secondary | ICD-10-CM | POA: Diagnosis not present

## 2023-02-24 DIAGNOSIS — K219 Gastro-esophageal reflux disease without esophagitis: Secondary | ICD-10-CM | POA: Insufficient documentation

## 2023-02-24 DIAGNOSIS — F17210 Nicotine dependence, cigarettes, uncomplicated: Secondary | ICD-10-CM | POA: Diagnosis not present

## 2023-02-24 DIAGNOSIS — H0100A Unspecified blepharitis right eye, upper and lower eyelids: Secondary | ICD-10-CM | POA: Diagnosis not present

## 2023-02-24 DIAGNOSIS — F419 Anxiety disorder, unspecified: Secondary | ICD-10-CM | POA: Insufficient documentation

## 2023-02-24 DIAGNOSIS — H0100B Unspecified blepharitis left eye, upper and lower eyelids: Secondary | ICD-10-CM | POA: Diagnosis not present

## 2023-02-24 DIAGNOSIS — J4520 Mild intermittent asthma, uncomplicated: Secondary | ICD-10-CM | POA: Diagnosis not present

## 2023-02-24 DIAGNOSIS — Z950 Presence of cardiac pacemaker: Secondary | ICD-10-CM | POA: Diagnosis not present

## 2023-02-24 DIAGNOSIS — F418 Other specified anxiety disorders: Secondary | ICD-10-CM

## 2023-02-24 HISTORY — PX: CATARACT EXTRACTION W/PHACO: SHX586

## 2023-02-24 SURGERY — PHACOEMULSIFICATION, CATARACT, WITH IOL INSERTION
Anesthesia: Monitor Anesthesia Care | Site: Eye | Laterality: Left

## 2023-02-24 MED ORDER — LACTATED RINGERS IV SOLN: INTRAVENOUS | Status: DC

## 2023-02-24 MED ORDER — MIDAZOLAM HCL 2 MG/2ML IJ SOLN
INTRAMUSCULAR | Status: DC | PRN
  Administered 2023-02-24: 2 mg via INTRAVENOUS

## 2023-02-24 MED ORDER — BSS IO SOLN
INTRAOCULAR | Status: DC | PRN
  Administered 2023-02-24: 15 mL via INTRAOCULAR

## 2023-02-24 MED ORDER — LIDOCAINE HCL 3.5 % OP GEL
1.0000 | Freq: Once | OPHTHALMIC | Status: AC
  Administered 2023-02-24: 1 via OPHTHALMIC

## 2023-02-24 MED ORDER — STERILE WATER FOR IRRIGATION IR SOLN
Status: DC | PRN
  Administered 2023-02-24: 25 mL

## 2023-02-24 MED ORDER — EPINEPHRINE PF 1 MG/ML IJ SOLN
INTRAOCULAR | Status: DC | PRN
  Administered 2023-02-24: 500 mL

## 2023-02-24 MED ORDER — MIDAZOLAM HCL 2 MG/2ML IJ SOLN
INTRAMUSCULAR | Status: AC
  Filled 2023-02-24: qty 2

## 2023-02-24 MED ORDER — MOXIFLOXACIN HCL 5 MG/ML IO SOLN
INTRAOCULAR | Status: AC
  Filled 2023-02-24: qty 1

## 2023-02-24 MED ORDER — SODIUM CHLORIDE (PF) 0.9 % IJ SOLN
INTRAMUSCULAR | Status: AC
  Filled 2023-02-24: qty 10

## 2023-02-24 MED ORDER — TETRACAINE HCL 0.5 % OP SOLN
1.0000 [drp] | OPHTHALMIC | Status: AC | PRN
  Administered 2023-02-24 (×3): 1 [drp] via OPHTHALMIC

## 2023-02-24 MED ORDER — MOXIFLOXACIN HCL 5 MG/ML IO SOLN
INTRAOCULAR | Status: DC | PRN
  Administered 2023-02-24: .3 mL via OPHTHALMIC

## 2023-02-24 MED ORDER — LIDOCAINE HCL (PF) 1 % IJ SOLN
INTRAOCULAR | Status: DC | PRN
  Administered 2023-02-24: 1 mL via OPHTHALMIC

## 2023-02-24 MED ORDER — TROPICAMIDE 1 % OP SOLN
1.0000 [drp] | OPHTHALMIC | Status: AC | PRN
  Administered 2023-02-24 (×3): 1 [drp] via OPHTHALMIC

## 2023-02-24 MED ORDER — POVIDONE-IODINE 5 % OP SOLN
OPHTHALMIC | Status: DC | PRN
  Administered 2023-02-24: 1 via OPHTHALMIC

## 2023-02-24 MED ORDER — SODIUM CHLORIDE 0.9% FLUSH
INTRAVENOUS | Status: DC | PRN
Start: 2023-02-24 — End: 2023-02-24
  Administered 2023-02-24: 5 mL via INTRAVENOUS

## 2023-02-24 MED ORDER — SODIUM HYALURONATE 23MG/ML IO SOSY
PREFILLED_SYRINGE | INTRAOCULAR | Status: DC | PRN
  Administered 2023-02-24: .6 mL via INTRAOCULAR

## 2023-02-24 MED ORDER — PHENYLEPHRINE HCL 2.5 % OP SOLN
1.0000 [drp] | OPHTHALMIC | Status: AC | PRN
  Administered 2023-02-24 (×3): 1 [drp] via OPHTHALMIC

## 2023-02-24 MED ORDER — EPINEPHRINE PF 1 MG/ML IJ SOLN
INTRAMUSCULAR | Status: AC
  Filled 2023-02-24: qty 2

## 2023-02-24 MED ORDER — SODIUM HYALURONATE 10 MG/ML IO SOLUTION
PREFILLED_SYRINGE | INTRAOCULAR | Status: DC | PRN
  Administered 2023-02-24: .85 mL via INTRAOCULAR

## 2023-02-24 SURGICAL SUPPLY — 14 items
CATARACT SUITE SIGHTPATH (MISCELLANEOUS) ×1
CLOTH BEACON ORANGE TIMEOUT ST (SAFETY) ×2 IMPLANT
EYE SHIELD UNIVERSAL CLEAR (GAUZE/BANDAGES/DRESSINGS) IMPLANT
FEE CATARACT SUITE SIGHTPATH (MISCELLANEOUS) ×2 IMPLANT
GLOVE BIOGEL PI IND STRL 6.5 (GLOVE) IMPLANT
GLOVE BIOGEL PI IND STRL 7.0 (GLOVE) ×4 IMPLANT
LENS IOL TECNIS EYHANCE 21.0 (Intraocular Lens) IMPLANT
NDL HYPO 18GX1.5 BLUNT FILL (NEEDLE) ×2 IMPLANT
NEEDLE HYPO 18GX1.5 BLUNT FILL (NEEDLE) ×1
PAD ARMBOARD 7.5X6 YLW CONV (MISCELLANEOUS) ×2 IMPLANT
POSITIONER HEAD 8X9X4 ADT (SOFTGOODS) ×2 IMPLANT
SYR TB 1ML LL NO SAFETY (SYRINGE) ×2 IMPLANT
TAPE SURG TRANSPORE 1 IN (GAUZE/BANDAGES/DRESSINGS) IMPLANT
WATER STERILE IRR 250ML POUR (IV SOLUTION) ×2 IMPLANT

## 2023-02-24 NOTE — Anesthesia Procedure Notes (Signed)
Procedure Name: MAC Date/Time: 02/24/2023 12:58 PM  Performed by: Julian Reil, CRNAPre-anesthesia Checklist: Patient identified, Emergency Drugs available, Suction available and Patient being monitored Patient Re-evaluated:Patient Re-evaluated prior to induction Oxygen Delivery Method: Nasal cannula Placement Confirmation: positive ETCO2

## 2023-02-24 NOTE — Op Note (Signed)
Date of procedure: 02/24/23  Pre-operative diagnosis: Visually significant age-related combined cataract, Left Eye (H25.812)  Post-operative diagnosis: Visually significant age-related combined cataract, Left Eye (H25.812)  Procedure: Removal of cataract via phacoemulsification and insertion of intra-ocular lens Johnson and Johnson DIB00 +21.0D into the capsular bag of the Left Eye  Attending surgeon: Rudy Jew. Taquana Bartley, MD, MA  Anesthesia: MAC, Topical Akten  Complications: None  Estimated Blood Loss: <20mL (minimal)  Specimens: None  Implants: As above  Indications:  Visually significant age-related cataract, Left Eye  Procedure:  The patient was seen and identified in the pre-operative area. The operative eye was identified and dilated.  The operative eye was marked.  Topical anesthesia was administered to the operative eye.     The patient was then to the operative suite and placed in the supine position.  A timeout was performed confirming the patient, procedure to be performed, and all other relevant information.   The patient's face was prepped and draped in the usual fashion for intra-ocular surgery.  A lid speculum was placed into the operative eye and the surgical microscope moved into place and focused.  An inferotemporal paracentesis was created using a 20 gauge paracentesis blade.  Shugarcaine was injected into the anterior chamber.  Viscoelastic was injected into the anterior chamber.  A temporal clear-corneal main wound incision was created using a 2.23mm microkeratome.  A continuous curvilinear capsulorrhexis was initiated using an irrigating cystitome and completed using capsulorrhexis forceps.  Hydrodissection and hydrodeliniation were performed.  Viscoelastic was injected into the anterior chamber.  A phacoemulsification handpiece and a chopper as a second instrument were used to remove the nucleus and epinucleus. The irrigation/aspiration handpiece was used to remove any  remaining cortical material.   The capsular bag was reinflated with viscoelastic, checked, and found to be intact.  The intraocular lens was inserted into the capsular bag.  The irrigation/aspiration handpiece was used to remove any remaining viscoelastic.  The clear corneal wound and paracentesis wounds were then hydrated and checked with Weck-Cels to be watertight. 0.29mL of Moxfloxacin was injected into the anterior chamber. The lid-speculum was removed.  The drape was removed.  The patient's face was cleaned with a wet and dry 4x4.    A clear shield was taped over the eye. The patient was taken to the post-operative care unit in good condition, having tolerated the procedure well.  Post-Op Instructions: The patient will follow up at [facility] for a same day post-operative evaluation and will receive all other orders and instructions.

## 2023-02-24 NOTE — Anesthesia Preprocedure Evaluation (Signed)
Anesthesia Evaluation  Patient identified by MRN, date of birth, ID band Patient awake    Reviewed: Allergy & Precautions, H&P , NPO status , Patient's Chart, lab work & pertinent test results, reviewed documented beta blocker date and time   History of Anesthesia Complications (+) PONV and history of anesthetic complications  Airway Mallampati: II  TM Distance: >3 FB Neck ROM: full    Dental no notable dental hx.    Pulmonary shortness of breath, asthma , sleep apnea , former smoker   Pulmonary exam normal breath sounds clear to auscultation       Cardiovascular Exercise Tolerance: Good negative cardio ROS + dysrhythmias + pacemaker  Rhythm:regular Rate:Normal     Neuro/Psych  Headaches PSYCHIATRIC DISORDERS Anxiety Depression     Neuromuscular disease negative neurological ROS  negative psych ROS   GI/Hepatic negative GI ROS, Neg liver ROS,GERD  ,,  Endo/Other  negative endocrine ROS    Renal/GU negative Renal ROS  negative genitourinary   Musculoskeletal   Abdominal   Peds  Hematology negative hematology ROS (+)   Anesthesia Other Findings   Reproductive/Obstetrics negative OB ROS                             Anesthesia Physical Anesthesia Plan  ASA: 3  Anesthesia Plan: MAC   Post-op Pain Management:    Induction:   PONV Risk Score and Plan:   Airway Management Planned:   Additional Equipment:   Intra-op Plan:   Post-operative Plan:   Informed Consent: I have reviewed the patients History and Physical, chart, labs and discussed the procedure including the risks, benefits and alternatives for the proposed anesthesia with the patient or authorized representative who has indicated his/her understanding and acceptance.     Dental Advisory Given  Plan Discussed with: CRNA  Anesthesia Plan Comments:        Anesthesia Quick Evaluation

## 2023-02-24 NOTE — Discharge Instructions (Signed)
Please discharge patient when stable, will follow up today with Dr. Carnella Fryman at the [facility] office immediately following discharge.  Leave shield in place until visit.  All paperwork with discharge instructions will be given at the office.  [facility] Address:  [address]  [place], [place] [address]  

## 2023-02-24 NOTE — Transfer of Care (Signed)
Immediate Anesthesia Transfer of Care Note  Patient: [name]  Procedure(s) Performed: CATARACT EXTRACTION PHACO AND INTRAOCULAR LENS PLACEMENT (IOC) (Left: Eye)  Patient Location: Short Stay  Anesthesia Type:MAC  Level of Consciousness: awake, alert , and oriented  Airway & Oxygen Therapy: Patient Spontanous Breathing  Post-op Assessment: Report given to RN and Post -op Vital signs reviewed and stable  Post vital signs: Reviewed and stable  Last Vitals:  Vitals Value Taken Time  BP    Temp    Pulse    Resp    SpO2      Last Pain:  Vitals:   02/24/23 1150  TempSrc: Oral  PainSc: 0-No pain         Complications: No notable events documented.

## 2023-02-24 NOTE — Interval H&P Note (Signed)
History and Physical Interval Note:  02/24/2023 12:53 PM  [name]  has presented today for surgery, with the diagnosis of combined forms age related cataract, left eye.  The various methods of treatment have been discussed with the patient and family. After consideration of risks, benefits and other options for treatment, the patient has consented to  Procedure(s): CATARACT EXTRACTION PHACO AND INTRAOCULAR LENS PLACEMENT (IOC) (Left) as a surgical intervention.  The patient's history has been reviewed, patient examined, no change in status, stable for surgery.  I have reviewed the patient's chart and labs.  Questions were answered to the patient's satisfaction.     Fabio Pierce

## 2023-02-27 ENCOUNTER — Encounter (HOSPITAL_COMMUNITY): Payer: Self-pay | Admitting: Ophthalmology

## 2023-02-28 NOTE — Anesthesia Postprocedure Evaluation (Signed)
Anesthesia Post Note  Patient: [name]  Procedure(s) Performed: CATARACT EXTRACTION PHACO AND INTRAOCULAR LENS PLACEMENT (IOC) (Left: Eye)  Patient location during evaluation: Phase II Anesthesia Type: MAC Level of consciousness: awake Pain management: pain level controlled Vital Signs Assessment: post-procedure vital signs reviewed and stable Respiratory status: spontaneous breathing and respiratory function stable Cardiovascular status: blood pressure returned to baseline and stable Postop Assessment: no headache and no apparent nausea or vomiting Anesthetic complications: no Comments: Late entry   No notable events documented.   Last Vitals:  Vitals:   02/24/23 1150 02/24/23 1317  BP: (!) 112/58 (!) 104/57  Pulse: 64 68  Resp: 18 11  Temp: 36.6 C 36.7 C  SpO2: 98% 100%    Last Pain:  Vitals:   02/25/23 1544  TempSrc:   PainSc: 0-No pain                 [name]

## 2023-03-11 ENCOUNTER — Ambulatory Visit (INDEPENDENT_AMBULATORY_CARE_PROVIDER_SITE_OTHER): Payer: Medicare Other

## 2023-03-11 DIAGNOSIS — I442 Atrioventricular block, complete: Secondary | ICD-10-CM

## 2023-03-12 LAB — CUP PACEART REMOTE DEVICE CHECK
Battery Remaining Longevity: 30 mo
Battery Remaining Percentage: 24 %
Battery Voltage: 2.92 V
Brady Statistic AP VP Percent: 1 %
Brady Statistic AP VS Percent: 0 %
Brady Statistic AS VP Percent: 99 %
Brady Statistic AS VS Percent: 1 %
Brady Statistic RA Percent Paced: 1 %
Brady Statistic RV Percent Paced: 99 %
Date Time Interrogation Session: 20241008020015
Implantable Lead Connection Status: 753985
Implantable Lead Connection Status: 753985
Implantable Lead Implant Date: 20170418
Implantable Lead Implant Date: 20170418
Implantable Lead Location: 753859
Implantable Lead Location: 753860
Implantable Pulse Generator Implant Date: 20170418
Lead Channel Impedance Value: 480 Ohm
Lead Channel Impedance Value: 530 Ohm
Lead Channel Pacing Threshold Amplitude: 0.5 V
Lead Channel Pacing Threshold Amplitude: 0.875 V
Lead Channel Pacing Threshold Pulse Width: 0.5 ms
Lead Channel Pacing Threshold Pulse Width: 0.5 ms
Lead Channel Sensing Intrinsic Amplitude: 5 mV
Lead Channel Sensing Intrinsic Amplitude: 7.5 mV
Lead Channel Setting Pacing Amplitude: 1.125
Lead Channel Setting Pacing Amplitude: 2 V
Lead Channel Setting Pacing Pulse Width: 0.5 ms
Lead Channel Setting Sensing Sensitivity: 2 mV
Pulse Gen Model: 2272
Pulse Gen Serial Number: 7882213

## 2023-03-20 DIAGNOSIS — E785 Hyperlipidemia, unspecified: Secondary | ICD-10-CM | POA: Diagnosis not present

## 2023-03-20 DIAGNOSIS — R768 Other specified abnormal immunological findings in serum: Secondary | ICD-10-CM | POA: Diagnosis not present

## 2023-03-24 LAB — PROTEIN ELECTROPHORESIS,RANDOM URN
Albumin ELP, Urine: 28.4 %
Alpha-1-Globulin, U: 6.4 %
Alpha-2-Globulin, U: 13.6 %
Beta Globulin, U: 28.3 %
Creatinine, Urine: 84.4 mg/dL
Gamma Globulin, U: 23.3 %
Protein, Ur: 7.1 mg/dL

## 2023-03-24 LAB — PROTEIN ELECTROPHORESIS, SERUM
A/G Ratio: 1.3 (ref 0.7–1.7)
Albumin ELP: 3.9 g/dL (ref 2.9–4.4)
Alpha 1: 0.2 g/dL (ref 0.0–0.4)
Alpha 2: 0.8 g/dL (ref 0.4–1.0)
Beta: 1.1 g/dL (ref 0.7–1.3)
Gamma Globulin: 0.8 g/dL (ref 0.4–1.8)
Globulin, Total: 2.9 g/dL (ref 2.2–3.9)
Total Protein: 6.8 g/dL (ref 6.0–8.5)

## 2023-03-24 LAB — LIPID PANEL
Chol/HDL Ratio: 4.1 {ratio} (ref 0.0–4.4)
Cholesterol, Total: 271 mg/dL — ABNORMAL HIGH (ref 100–199)
HDL: 66 mg/dL (ref >39)
LDL Chol Calc (NIH): 161 mg/dL — ABNORMAL HIGH (ref 0–99)
Triglycerides: 239 mg/dL — ABNORMAL HIGH (ref 0–149)
VLDL Cholesterol Cal: 44 mg/dL — ABNORMAL HIGH (ref 5–40)

## 2023-03-24 LAB — KAPPA/LAMBDA LIGHT CHAINS
Ig Kappa Free Light Chain: 24.7 mg/L — ABNORMAL HIGH (ref 3.3–19.4)
Ig Lambda Free Light Chain: 20.7 mg/L (ref 5.7–26.3)
KAPPA/LAMBDA RATIO: 1.19 (ref 0.26–1.65)

## 2023-03-27 NOTE — Progress Notes (Signed)
Remote pacemaker transmission.   

## 2023-03-28 ENCOUNTER — Ambulatory Visit: Payer: Medicare Other | Admitting: Nurse Practitioner

## 2023-03-31 ENCOUNTER — Telehealth (INDEPENDENT_AMBULATORY_CARE_PROVIDER_SITE_OTHER): Payer: Medicare Other | Admitting: Nurse Practitioner

## 2023-03-31 ENCOUNTER — Ambulatory Visit: Payer: Medicare Other | Admitting: Family Medicine

## 2023-03-31 ENCOUNTER — Encounter: Payer: Self-pay | Admitting: Nurse Practitioner

## 2023-03-31 VITALS — Wt 173.0 lb

## 2023-03-31 DIAGNOSIS — E782 Mixed hyperlipidemia: Secondary | ICD-10-CM | POA: Diagnosis not present

## 2023-03-31 DIAGNOSIS — F411 Generalized anxiety disorder: Secondary | ICD-10-CM

## 2023-03-31 DIAGNOSIS — F430 Acute stress reaction: Secondary | ICD-10-CM | POA: Diagnosis not present

## 2023-03-31 DIAGNOSIS — Z79899 Other long term (current) drug therapy: Secondary | ICD-10-CM | POA: Diagnosis not present

## 2023-03-31 DIAGNOSIS — R5383 Other fatigue: Secondary | ICD-10-CM

## 2023-03-31 MED ORDER — BUPROPION HCL ER (XL) 150 MG PO TB24
150.0000 mg | ORAL_TABLET | Freq: Every day | ORAL | 0 refills | Status: DC
Start: 2023-03-31 — End: 2023-04-24

## 2023-03-31 MED ORDER — ROSUVASTATIN CALCIUM 10 MG PO TABS
10.0000 mg | ORAL_TABLET | Freq: Every day | ORAL | 0 refills | Status: DC
Start: 2023-03-31 — End: 2023-06-27

## 2023-03-31 NOTE — Progress Notes (Signed)
Virtual Visit via Telephone Note  I connected with [name] on 03/31/23 at 10:40 AM EDT by telephone and verified that I am speaking with the correct person using two identifiers.  Location: Patient: home Provider: office   I discussed the limitations, risks, security and privacy concerns of performing an evaluation and management service by telephone and the availability of in person appointments. I also discussed with the patient that there may be a patient responsible charge related to this service. The patient expressed understanding and agreed to proceed.   History of Present Illness: Tried multiple times to do a video visit but was unable to connect with patient so this was switched to a phone visit.  Patient presents today to discuss her recent lab work.  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  Denies chest pain/shortness of breath.  No edema.  Takes a rare trazodone for sleep.  Sertraline 50 mg is no longer working for her anxiety and stress.  Has tried to slowly increase in the past but makes her too groggy.  Patient was on bupropion for a long time in the past, had to stop this due to cost at that point.  Would like to try this again.  Complains of fatigue hair loss and generalized aches.  Relates this to her stress level.   Observations/Objective: Today's visit was via telephone Physical exam was not possible for this visit  Component Ref Range & Units 11 d ago 5 mo ago 2 yr ago 3 yr ago 4 yr ago 9 yr ago  Cholesterol, Total 100 - 199 mg/dL 045 High  409 High  811 High  267 High  256 High    Triglycerides 0 - 149 mg/dL 914 High  782 High  956 130 103 67 R  HDL >39 mg/dL 66 65 69 74 76 91  VLDL Cholesterol Cal 5 - 40 mg/dL 44 High  31 23 23 21     LDL Chol Calc (NIH) 0 - 99 mg/dL 213 High  086 High  578 High  170 High     Chol/HDL Ratio 0.0 - 4.4 ratio 4.1 3.8 CM 3.6 CM 3.6 CM 3.4 CM 2.4        Assessment and Plan: Problem List Items Addressed This Visit       Other   Fatigue   Relevant Orders   TSH   Other Visit Diagnoses     Mixed hyperlipidemia    -  Primary   Relevant Medications   rosuvastatin (CRESTOR) 10 MG tablet   Other Relevant Orders   Lipid panel   Anxiety as acute reaction to exceptional stress       Relevant Medications   buPROPion (WELLBUTRIN XL) 150 MG 24 hr tablet   High risk medication use       Relevant Orders   Hepatic function panel      Meds ordered this encounter  Medications   rosuvastatin (CRESTOR) 10 MG tablet    Sig: Take 1 tablet (10 mg total) by mouth daily. For cholesterol    Dispense:  90 tablet    Refill:  0    Order Specific Question:   Supervising Provider    Answer:   Lilyan Punt A [[id number]]  buPROPion (WELLBUTRIN XL) 150 MG 24 hr tablet    Sig: Take 1 tablet (150 mg total) by mouth daily.    Dispense:  30 tablet    Refill:  0    Order Specific Question:   Supervising Provider    Answer:   Lilyan Punt A [[id number]]    Follow Up Instructions: Start rosuvastatin as directed.  Repeat labs including a TSH in 8 to 12 weeks. Restart bupropion as directed.  Discontinue medication and contact office if any problems.  Patient plans to slowly wean off Zoloft if this is working. Discussed the importance of adequate rest and stress reduction.   I discussed the assessment and treatment plan with the patient. The patient was provided an opportunity to ask questions and all were answered. The patient agreed with the plan and demonstrated an understanding of the instructions.   The patient was advised to call back or seek an in-person evaluation if the symptoms worsen or if the condition fails to improve as anticipated.  I provided 15 minutes of non-face-to-face time during this  encounter.   Campbell Riches, NP

## 2023-04-17 ENCOUNTER — Other Ambulatory Visit: Payer: Self-pay | Admitting: Nurse Practitioner

## 2023-04-17 ENCOUNTER — Other Ambulatory Visit: Payer: Self-pay | Admitting: Family Medicine

## 2023-04-23 ENCOUNTER — Other Ambulatory Visit: Payer: Self-pay | Admitting: Nurse Practitioner

## 2023-04-29 DIAGNOSIS — M79672 Pain in left foot: Secondary | ICD-10-CM | POA: Diagnosis not present

## 2023-04-29 DIAGNOSIS — M2042 Other hammer toe(s) (acquired), left foot: Secondary | ICD-10-CM | POA: Diagnosis not present

## 2023-04-29 DIAGNOSIS — M79675 Pain in left toe(s): Secondary | ICD-10-CM | POA: Diagnosis not present

## 2023-04-29 DIAGNOSIS — M2041 Other hammer toe(s) (acquired), right foot: Secondary | ICD-10-CM | POA: Diagnosis not present

## 2023-04-29 DIAGNOSIS — M79671 Pain in right foot: Secondary | ICD-10-CM | POA: Diagnosis not present

## 2023-04-29 DIAGNOSIS — M79674 Pain in right toe(s): Secondary | ICD-10-CM | POA: Diagnosis not present

## 2023-05-11 ENCOUNTER — Emergency Department (HOSPITAL_COMMUNITY): Payer: Medicare Other

## 2023-05-11 ENCOUNTER — Other Ambulatory Visit: Payer: Self-pay

## 2023-05-11 ENCOUNTER — Inpatient Hospital Stay (HOSPITAL_COMMUNITY)
Admission: EM | Admit: 2023-05-11 | Discharge: 2023-05-13 | DRG: 536 | Disposition: A | Discharge status: Home Health | Payer: Medicare Other | Attending: Internal Medicine | Admitting: Internal Medicine

## 2023-05-11 ENCOUNTER — Encounter (HOSPITAL_COMMUNITY): Payer: Self-pay | Admitting: *Deleted

## 2023-05-11 DIAGNOSIS — G2581 Restless legs syndrome: Secondary | ICD-10-CM | POA: Diagnosis not present

## 2023-05-11 DIAGNOSIS — S32119A Unspecified Zone I fracture of sacrum, initial encounter for closed fracture: Secondary | ICD-10-CM | POA: Diagnosis present

## 2023-05-11 DIAGNOSIS — W19XXXD Unspecified fall, subsequent encounter: Secondary | ICD-10-CM | POA: Diagnosis not present

## 2023-05-11 DIAGNOSIS — Z950 Presence of cardiac pacemaker: Secondary | ICD-10-CM

## 2023-05-11 DIAGNOSIS — Z961 Presence of intraocular lens: Secondary | ICD-10-CM | POA: Diagnosis present

## 2023-05-11 DIAGNOSIS — K219 Gastro-esophageal reflux disease without esophagitis: Secondary | ICD-10-CM | POA: Diagnosis not present

## 2023-05-11 DIAGNOSIS — Z8249 Family history of ischemic heart disease and other diseases of the circulatory system: Secondary | ICD-10-CM | POA: Diagnosis not present

## 2023-05-11 DIAGNOSIS — F418 Other specified anxiety disorders: Secondary | ICD-10-CM | POA: Diagnosis present

## 2023-05-11 DIAGNOSIS — W19XXXA Unspecified fall, initial encounter: Secondary | ICD-10-CM

## 2023-05-11 DIAGNOSIS — E785 Hyperlipidemia, unspecified: Secondary | ICD-10-CM | POA: Diagnosis not present

## 2023-05-11 DIAGNOSIS — R9431 Abnormal electrocardiogram [ECG] [EKG]: Secondary | ICD-10-CM | POA: Diagnosis not present

## 2023-05-11 DIAGNOSIS — Z87891 Personal history of nicotine dependence: Secondary | ICD-10-CM

## 2023-05-11 DIAGNOSIS — S329XXA Fracture of unspecified parts of lumbosacral spine and pelvis, initial encounter for closed fracture: Secondary | ICD-10-CM | POA: Diagnosis present

## 2023-05-11 DIAGNOSIS — Z888 Allergy status to other drugs, medicaments and biological substances status: Secondary | ICD-10-CM

## 2023-05-11 DIAGNOSIS — I442 Atrioventricular block, complete: Secondary | ICD-10-CM | POA: Diagnosis not present

## 2023-05-11 DIAGNOSIS — R6889 Other general symptoms and signs: Secondary | ICD-10-CM | POA: Diagnosis not present

## 2023-05-11 DIAGNOSIS — Z79899 Other long term (current) drug therapy: Secondary | ICD-10-CM

## 2023-05-11 DIAGNOSIS — R1032 Left lower quadrant pain: Secondary | ICD-10-CM | POA: Diagnosis not present

## 2023-05-11 DIAGNOSIS — Z9049 Acquired absence of other specified parts of digestive tract: Secondary | ICD-10-CM

## 2023-05-11 DIAGNOSIS — S32511A Fracture of superior rim of right pubis, initial encounter for closed fracture: Secondary | ICD-10-CM | POA: Diagnosis not present

## 2023-05-11 DIAGNOSIS — W11XXXA Fall on and from ladder, initial encounter: Secondary | ICD-10-CM | POA: Diagnosis present

## 2023-05-11 DIAGNOSIS — M25552 Pain in left hip: Secondary | ICD-10-CM | POA: Diagnosis not present

## 2023-05-11 DIAGNOSIS — G43709 Chronic migraine without aura, not intractable, without status migrainosus: Secondary | ICD-10-CM | POA: Diagnosis not present

## 2023-05-11 DIAGNOSIS — R1031 Right lower quadrant pain: Secondary | ICD-10-CM | POA: Diagnosis not present

## 2023-05-11 DIAGNOSIS — S3210XA Unspecified fracture of sacrum, initial encounter for closed fracture: Secondary | ICD-10-CM | POA: Diagnosis not present

## 2023-05-11 DIAGNOSIS — S3289XA Fracture of other parts of pelvis, initial encounter for closed fracture: Secondary | ICD-10-CM | POA: Diagnosis not present

## 2023-05-11 DIAGNOSIS — Z881 Allergy status to other antibiotic agents status: Secondary | ICD-10-CM

## 2023-05-11 DIAGNOSIS — Z743 Need for continuous supervision: Secondary | ICD-10-CM | POA: Diagnosis not present

## 2023-05-11 DIAGNOSIS — F32A Depression, unspecified: Secondary | ICD-10-CM | POA: Diagnosis present

## 2023-05-11 DIAGNOSIS — F419 Anxiety disorder, unspecified: Secondary | ICD-10-CM | POA: Diagnosis present

## 2023-05-11 DIAGNOSIS — G43909 Migraine, unspecified, not intractable, without status migrainosus: Secondary | ICD-10-CM | POA: Diagnosis present

## 2023-05-11 LAB — CBC WITH DIFFERENTIAL/PLATELET
Abs Immature Granulocytes: 0.08 10*3/uL — ABNORMAL HIGH (ref 0.00–0.07)
Basophils Absolute: 0 10*3/uL (ref 0.0–0.1)
Basophils Relative: 0 %
Eosinophils Absolute: 0.2 10*3/uL (ref 0.0–0.5)
Eosinophils Relative: 2 %
HCT: 42.5 % (ref 36.0–46.0)
Hemoglobin: 13.5 g/dL (ref 12.0–15.0)
Immature Granulocytes: 1 %
Lymphocytes Relative: 20 %
Lymphs Abs: 2.4 10*3/uL (ref 0.7–4.0)
MCH: 31.2 pg (ref 26.0–34.0)
MCHC: 31.8 g/dL (ref 30.0–36.0)
MCV: 98.2 fL (ref 80.0–100.0)
Monocytes Absolute: 0.8 10*3/uL (ref 0.1–1.0)
Monocytes Relative: 7 %
Neutro Abs: 8.1 10*3/uL — ABNORMAL HIGH (ref 1.7–7.7)
Neutrophils Relative %: 70 %
Platelets: 233 10*3/uL (ref 150–400)
RBC: 4.33 MIL/uL (ref 3.87–5.11)
RDW: 14.1 % (ref 11.5–15.5)
WBC: 11.6 10*3/uL — ABNORMAL HIGH (ref 4.0–10.5)
nRBC: 0 % (ref 0.0–0.2)

## 2023-05-11 LAB — URINALYSIS, ROUTINE W REFLEX MICROSCOPIC
Bilirubin Urine: NEGATIVE
Glucose, UA: NEGATIVE mg/dL
Hgb urine dipstick: NEGATIVE
Ketones, ur: NEGATIVE mg/dL
Leukocytes,Ua: NEGATIVE
Nitrite: NEGATIVE
Protein, ur: NEGATIVE mg/dL
Specific Gravity, Urine: 1.011 (ref 1.005–1.030)
pH: 8 (ref 5.0–8.0)

## 2023-05-11 LAB — BASIC METABOLIC PANEL
Anion gap: 11 (ref 5–15)
BUN: 17 mg/dL (ref 8–23)
CO2: 25 mmol/L (ref 22–32)
Calcium: 9.3 mg/dL (ref 8.9–10.3)
Chloride: 105 mmol/L (ref 98–111)
Creatinine, Ser: 0.85 mg/dL (ref 0.44–1.00)
GFR, Estimated: >60 mL/min (ref >60)
Glucose, Bld: 99 mg/dL (ref 70–99)
Potassium: 4.4 mmol/L (ref 3.5–5.1)
Sodium: 141 mmol/L (ref 135–145)

## 2023-05-11 MED ORDER — ACETAMINOPHEN 650 MG RE SUPP: 650.0000 mg | Freq: Four times a day (QID) | RECTAL | Status: DC | PRN

## 2023-05-11 MED ORDER — MELATONIN 5 MG PO TABS
10.0000 mg | ORAL_TABLET | Freq: Every day | ORAL | Status: DC
  Filled 2023-05-11: qty 2

## 2023-05-11 MED ORDER — TOPIRAMATE 100 MG PO TABS
100.0000 mg | ORAL_TABLET | Freq: Two times a day (BID) | ORAL | Status: DC
  Administered 2023-05-11 – 2023-05-13 (×4): 100 mg via ORAL
  Filled 2023-05-11 (×4): qty 1

## 2023-05-11 MED ORDER — MORPHINE SULFATE (PF) 4 MG/ML IV SOLN
4.0000 mg | Freq: Once | INTRAVENOUS | Status: AC
  Administered 2023-05-11: 4 mg via INTRAVENOUS
  Filled 2023-05-11: qty 1

## 2023-05-11 MED ORDER — LEVOCETIRIZINE DIHYDROCHLORIDE 5 MG PO TABS: 5.0000 mg | ORAL_TABLET | Freq: Every evening | ORAL | Status: DC

## 2023-05-11 MED ORDER — SODIUM CHLORIDE 0.9 % IV SOLN: INTRAVENOUS | Status: AC

## 2023-05-11 MED ORDER — MELATONIN 3 MG PO TABS
9.0000 mg | ORAL_TABLET | Freq: Every day | ORAL | Status: DC
  Administered 2023-05-11 – 2023-05-12 (×2): 9 mg via ORAL
  Filled 2023-05-11 (×2): qty 3

## 2023-05-11 MED ORDER — MORPHINE SULFATE (PF) 4 MG/ML IV SOLN
4.0000 mg | INTRAVENOUS | Status: DC | PRN
  Administered 2023-05-12 – 2023-05-13 (×5): 4 mg via INTRAVENOUS
  Filled 2023-05-11 (×5): qty 1

## 2023-05-11 MED ORDER — POLYETHYLENE GLYCOL 3350 17 G PO PACK
17.0000 g | PACK | Freq: Every day | ORAL | Status: DC | PRN
Start: 2023-05-11 — End: 2023-05-13
  Administered 2023-05-13: 17 g via ORAL
  Filled 2023-05-11: qty 1

## 2023-05-11 MED ORDER — MONTELUKAST SODIUM 10 MG PO TABS
10.0000 mg | ORAL_TABLET | Freq: Every day | ORAL | Status: DC
  Administered 2023-05-11 – 2023-05-12 (×2): 10 mg via ORAL
  Filled 2023-05-11 (×2): qty 1

## 2023-05-11 MED ORDER — FENTANYL CITRATE PF 50 MCG/ML IJ SOSY
50.0000 ug | PREFILLED_SYRINGE | Freq: Once | INTRAMUSCULAR | Status: AC
  Administered 2023-05-11: 50 ug via INTRAVENOUS
  Filled 2023-05-11: qty 1

## 2023-05-11 MED ORDER — ONDANSETRON HCL 4 MG PO TABS: 4.0000 mg | ORAL_TABLET | Freq: Four times a day (QID) | ORAL | Status: DC | PRN

## 2023-05-11 MED ORDER — ACETAMINOPHEN 325 MG PO TABS: 650.0000 mg | ORAL_TABLET | Freq: Four times a day (QID) | ORAL | Status: DC | PRN

## 2023-05-11 MED ORDER — BUPROPION HCL ER (XL) 150 MG PO TB24
150.0000 mg | ORAL_TABLET | Freq: Every day | ORAL | Status: DC
  Administered 2023-05-12 – 2023-05-13 (×2): 150 mg via ORAL
  Filled 2023-05-11 (×2): qty 1

## 2023-05-11 MED ORDER — ROPINIROLE HCL 0.25 MG PO TABS
0.5000 mg | ORAL_TABLET | Freq: Two times a day (BID) | ORAL | Status: DC
  Administered 2023-05-11 – 2023-05-13 (×4): 0.5 mg via ORAL
  Filled 2023-05-11 (×4): qty 2

## 2023-05-11 MED ORDER — ONDANSETRON HCL 4 MG/2ML IJ SOLN: 4.0000 mg | Freq: Four times a day (QID) | INTRAMUSCULAR | Status: DC | PRN

## 2023-05-11 MED ORDER — ENOXAPARIN SODIUM 40 MG/0.4ML IJ SOSY
40.0000 mg | PREFILLED_SYRINGE | INTRAMUSCULAR | Status: DC
  Administered 2023-05-11 – 2023-05-12 (×2): 40 mg via SUBCUTANEOUS
  Filled 2023-05-11 (×2): qty 0.4

## 2023-05-11 MED ORDER — LORATADINE 10 MG PO TABS
10.0000 mg | ORAL_TABLET | Freq: Every day | ORAL | Status: DC
  Administered 2023-05-11 – 2023-05-13 (×3): 10 mg via ORAL
  Filled 2023-05-11 (×3): qty 1

## 2023-05-11 NOTE — ED Notes (Signed)
ED TO INPATIENT HANDOFF REPORT  ED Nurse Name and Phone #: Wilkie Aye [phone]  S Name/Age/Gender [name] 72 y.o. female Room/Bed: APA11/APA11  Code Status   Code Status: Prior  Home/SNF/Other Rehab Patient oriented to: situation Is this baseline? Yes   Triage Complete: Triage complete  Chief Complaint fall  Triage Note Pt was getting some Christmas decorations down and fell about 4-5 feet off the attic ladder. Pt c/o pain to right groin area and left hip. Denies use of blood thinners and LOC. EMS reports no leg shortening or rotation and that pulses are present in both feet.    Allergies Allergies  Allergen Reactions   Beta Adrenergic Blockers Shortness Of Breath   Ambien [Zolpidem Tartrate] Other (See Comments)    Hallucinations   Biaxin [Clarithromycin] Hives    May have caused hives see 06/19/15   Duloxetine Hcl Other (See Comments)    Vivid nightmares and grogginess.    Fosamax [Alendronate Sodium]     gastritis    Level of Care/Admitting Diagnosis ED Disposition     None       B Medical/Surgery History Past Medical History:  Diagnosis Date   Acid reflux    Allergy    Anxiety    Asthma    Complete heart block (HCC) 09/2015   a. s/p [employer] Jude PPM 09/2015.   Dyspnea    Former tobacco use    Hyperlipidemia    Irritable bowel syndrome (IBS) 02/27/2017   Mast cell disease    Migraines    Chronic   PAT (paroxysmal atrial tachycardia) (HCC)    PONV (postoperative nausea and vomiting)    Presence of permanent cardiac pacemaker 09/19/2015   Sinusitis    Sleep apnea    pt denies   Urticaria    Past Surgical History:  Procedure Laterality Date   APPENDECTOMY     AUGMENTATION MAMMAPLASTY Bilateral    BIOPSY  06/17/2017   Procedure: BIOPSY;  Surgeon: West Bali, MD;  Location: [facility];  Service: Endoscopy;;  colon   BREAST ENHANCEMENT SURGERY  1985   CATARACT EXTRACTION W/PHACO Right 01/31/2023   Procedure: CATARACT EXTRACTION PHACO AND  INTRAOCULAR LENS PLACEMENT (IOC);  Surgeon: Fabio Pierce, MD;  Location: [facility];  Service: Ophthalmology;  Laterality: Right;  CDE 6.69   CATARACT EXTRACTION W/PHACO Left 02/24/2023   Procedure: CATARACT EXTRACTION PHACO AND INTRAOCULAR LENS PLACEMENT (IOC);  Surgeon: Fabio Pierce, MD;  Location: [facility];  Service: Ophthalmology;  Laterality: Left;  CDE: 4.87   CHOLECYSTECTOMY     COLONOSCOPY     COLONOSCOPY WITH PROPOFOL N/A 06/17/2017   Dr. Darrick Penna: 30 mm polyp removed from the ascending colon piecemeal, tattooed, tubular adenoma.  8 mm polyp in the ascending colon, four 3 to 5 mm polyps removed from the rectum and transverse colon.  Tubular adenomas.  External and internal hemorrhoids.  Redundant colon.  Random colon biopsies negative.  Next colonoscopy in 3 years.   EP IMPLANTABLE DEVICE N/A 09/19/2015   Procedure: Pacemaker Implant;  Surgeon: Will Jorja Loa, MD;  Location: [facility];  Service: Cardiovascular;  Laterality: N/A;   ESOPHAGOGASTRODUODENOSCOPY (EGD) WITH PROPOFOL N/A 06/17/2017   Dr. Darrick Penna: Medium sized hiatal hernia, mild erosive gastritis, small bowel biopsies negative for celiac.  Gastric biopsies with chronic mild inactive gastritis but no H. pylori.   INSERT / REPLACE / REMOVE PACEMAKER     POLYPECTOMY  06/17/2017   Procedure: POLYPECTOMY;  Surgeon: West Bali,  MD;  Location: [facility];  Service: Endoscopy;;  colon   TONSILLECTOMY AND ADENOIDECTOMY     TUBAL LIGATION       A IV Location/Drains/Wounds Patient Lines/Drains/Airways Status     Active Line/Drains/Airways     Name Placement date Placement time Site Days   Peripheral IV 05/11/23 18 G 1" Left Antecubital 05/11/23  1300  Antecubital  less than 1   Urethral Catheter Hailey S NT3 Triple-lumen 14 Fr. 05/11/23  1756  Triple-lumen  less than 1   External Urinary Catheter 05/11/23  1252  --  less than 1            Intake/Output Last 24 hours No intake or output data in the 24 hours  ending 05/11/23 1816  Labs/Imaging Results for orders placed or performed during the hospital encounter of 05/11/23 (from the past 48 hour(s))  CBC with Differential     Status: Abnormal   Collection Time: 05/11/23  1:19 PM  Result Value Ref Range   WBC 11.6 (H) 4.0 - 10.5 K/uL   RBC 4.33 3.87 - 5.11 MIL/uL   Hemoglobin 13.5 12.0 - 15.0 g/dL   HCT 09.6 04.5 - 40.9 %   MCV 98.2 80.0 - 100.0 fL   MCH 31.2 26.0 - 34.0 pg   MCHC 31.8 30.0 - 36.0 g/dL   RDW 81.1 91.4 - 78.2 %   Platelets 233 150 - 400 K/uL    Comment: REPEATED TO VERIFY   nRBC 0.0 0.0 - 0.2 %   Neutrophils Relative % 70 %   Neutro Abs 8.1 (H) 1.7 - 7.7 K/uL   Lymphocytes Relative 20 %   Lymphs Abs 2.4 0.7 - 4.0 K/uL   Monocytes Relative 7 %   Monocytes Absolute 0.8 0.1 - 1.0 K/uL   Eosinophils Relative 2 %   Eosinophils Absolute 0.2 0.0 - 0.5 K/uL   Basophils Relative 0 %   Basophils Absolute 0.0 0.0 - 0.1 K/uL   Immature Granulocytes 1 %   Abs Immature Granulocytes 0.08 (H) 0.00 - 0.07 K/uL    Comment: Performed at [facility], [address] [place], [place] [address]  Basic metabolic panel     Status: None   Collection Time: 05/11/23  1:19 PM  Result Value Ref Range   Sodium 141 135 - 145 mmol/L   Potassium 4.4 3.5 - 5.1 mmol/L   Chloride 105 98 - 111 mmol/L   CO2 25 22 - 32 mmol/L   Glucose, Bld 99 70 - 99 mg/dL    Comment: Glucose reference range applies only to samples taken after fasting for at least 8 hours.   BUN 17 8 - 23 mg/dL   Creatinine, Ser 3.08 0.44 - 1.00 mg/dL   Calcium 9.3 8.9 - 65.7 mg/dL   GFR, Estimated >84 >69 mL/min    Comment: (NOTE) Calculated using the CKD-EPI Creatinine Equation (2021)    Anion gap 11 5 - 15    Comment: Performed at [facility], [address], [place], [place] [address]   CT PELVIS WO CONTRAST  Result Date: 05/11/2023 CLINICAL DATA:  Larey Seat, right inguinal and left hip pain, possible right parasymphyseal fracture on x-ray EXAM: CT PELVIS WITHOUT CONTRAST  TECHNIQUE: Multidetector CT imaging of the pelvis was performed following the standard protocol without intravenous contrast. RADIATION DOSE REDUCTION: This exam was performed according to the departmental dose-optimization program which includes automated exposure control, adjustment of the mA and/or kV according to patient size and/or use of  iterative reconstruction technique. COMPARISON:  11/20/2016, 05/11/2023 FINDINGS: Urinary Tract:  Distal ureters and bladder are unremarkable. Bowel: No bowel obstruction or ileus. No bowel wall thickening or inflammatory change. Vascular/Lymphatic: Atherosclerosis of the aorta and iliac vessels. No pathologic adenopathy. Reproductive:  Uterus and adnexal structures are unremarkable. Other: No free fluid or free intraperitoneal gas. No abdominal wall hernia. Musculoskeletal: There is a comminuted mildly displaced right parasymphyseal fracture extending into the right superior pubic ramus, corresponding to prior x-ray findings. Alignment is near anatomic. There is a nondisplaced fracture in the sagittal plane through the lateral margin of the left sacral ala. No other acute pelvic fractures. Symmetrical bilateral hip osteoarthritis. Severe spondylosis and facet hypertrophy at the L5-S1 level, with severe bilateral neural foraminal encroachment. Reconstructed images demonstrate no additional findings. IMPRESSION: 1. Acute comminuted minimally displaced right parasymphyseal fracture extending of the right superior pubic ramus, corresponding to x-ray findings. Alignment is near anatomic. 2. Nondisplaced acute fracture through the lateral aspect of the left sacral ala. 3. Severe spondylosis and facet hypertrophy at the L5-S1 level, with severe bilateral neural foraminal encroachment. 4.  Aortic Atherosclerosis (ICD10-I70.0). Electronically Signed   By: Sharlet Salina M.D.   On: 05/11/2023 16:44   DG Hip Unilat W or Wo Pelvis 2-3 Views Left  Result Date: 05/11/2023 CLINICAL  DATA:  Fall.  Right groin and left hip pain. EXAM: DG HIP (WITH OR WITHOUT PELVIS) 2-3V LEFT; LUMBAR SPINE - COMPLETE 4+ VIEW COMPARISON:  11/01/2016 abdominopelvic CT. FINDINGS: Lumbar spine: Cholecystectomy. Sacroiliac joints are symmetric. Mild limitations secondary to cross-table lateral technique. Grossly maintained vertebral body height. Intervertebral disc heights are maintained. No dedicated lumbosacral spine image submitted. The second oblique image is suboptimal secondary to positioning. Left hip: AP view of the pelvis and AP/cross-table lateral views of the left hip. No frog-leg view is submitted. Femoral heads are located. Sacroiliac joints are symmetric. Cross-table lateral view is suboptimal secondary to positioning and patient's size. Subtle osseous irregularity involving the parasymphyseal right pubic bone. IMPRESSION: Mild technique limitations as detailed above. Osseous irregularity involving the right parasymphyseal pubic bone. Cannot exclude fracture. Correlate with point tenderness and consider CT. Otherwise, no acute finding about the lumbar spine or left hip. Electronically Signed   By: Jeronimo Greaves M.D.   On: 05/11/2023 14:29   DG Lumbar Spine Complete  Result Date: 05/11/2023 CLINICAL DATA:  Fall.  Right groin and left hip pain. EXAM: DG HIP (WITH OR WITHOUT PELVIS) 2-3V LEFT; LUMBAR SPINE - COMPLETE 4+ VIEW COMPARISON:  11/01/2016 abdominopelvic CT. FINDINGS: Lumbar spine: Cholecystectomy. Sacroiliac joints are symmetric. Mild limitations secondary to cross-table lateral technique. Grossly maintained vertebral body height. Intervertebral disc heights are maintained. No dedicated lumbosacral spine image submitted. The second oblique image is suboptimal secondary to positioning. Left hip: AP view of the pelvis and AP/cross-table lateral views of the left hip. No frog-leg view is submitted. Femoral heads are located. Sacroiliac joints are symmetric. Cross-table lateral view is suboptimal  secondary to positioning and patient's size. Subtle osseous irregularity involving the parasymphyseal right pubic bone. IMPRESSION: Mild technique limitations as detailed above. Osseous irregularity involving the right parasymphyseal pubic bone. Cannot exclude fracture. Correlate with point tenderness and consider CT. Otherwise, no acute finding about the lumbar spine or left hip. Electronically Signed   By: Jeronimo Greaves M.D.   On: 05/11/2023 14:29   DG Chest 1 View  Result Date: 05/11/2023 CLINICAL DATA:  Status post fall.  Right groin and left hip pain. EXAM: CHEST  1  VIEW COMPARISON:  Radiographs 06/05/2020 and 01/12/2016. FINDINGS: 1349 hours. Lordotic positioning. Left subclavian pacemaker leads appear unchanged, projecting over the right atrium and right ventricle. The heart size and mediastinal contours are stable. Stable mild biapical scarring. The lungs are otherwise clear. No evidence of pleural effusion or pneumothorax. No acute fractures are identified in the chest. IMPRESSION: No evidence of acute chest injury. Stable chest radiograph. Electronically Signed   By: Carey Bullocks M.D.   On: 05/11/2023 14:28    Pending Labs Unresulted Labs (From admission, onward)     Start     Ordered   05/11/23 1259  Urinalysis, Routine w reflex microscopic -Urine, Clean Catch  Once,   URGENT       Question:  Specimen Source  Answer:  Urine, Clean Catch   05/11/23 1258            Vitals/Pain Today's Vitals   05/11/23 1515 05/11/23 1545 05/11/23 1615 05/11/23 1810  BP: (!) 104/58 (!) 100/52 93/78 (!) 106/42  Pulse: 75 78  75  Resp: 15 (!) 22 18 14   Temp:      TempSrc:      SpO2: 94% 92%  95%  Weight:      Height:      PainSc:        Isolation Precautions No active isolations  Medications Medications  fentaNYL (SUBLIMAZE) injection 50 mcg (50 mcg Intravenous Given 05/11/23 1306)  morphine (PF) 4 MG/ML injection 4 mg (4 mg Intravenous Given 05/11/23 1452)  morphine (PF) 4 MG/ML  injection 4 mg (4 mg Intravenous Given 05/11/23 1807)    Mobility non-ambulatory     Focused Assessments     R Recommendations: See Admitting Provider Note  Report given to:   Additional Notes:

## 2023-05-11 NOTE — Assessment & Plan Note (Addendum)
Mechanical fall with pelvic fracture, unable to ambulate, will likely need placement.  CT pelvis - acute comminuted minimally displaced right parasymphyseal fracture extending of the right superior pubic ramus.  -IV morphine 4 mg every 4 hours as needed -PT eval  -Ortho consult -Blood pressure soft, N/s 100 cc/h x 10 hours

## 2023-05-11 NOTE — ED Triage Notes (Addendum)
Pt was getting some Christmas decorations down and fell about 4-5 feet off the attic ladder. Pt c/o pain to right groin area and left hip. Denies use of blood thinners and LOC. EMS reports no leg shortening or rotation and that pulses are present in both feet.

## 2023-05-11 NOTE — Assessment & Plan Note (Signed)
Complete heart block with pacemaker.  Follows with Dr. Elberta Fortis

## 2023-05-11 NOTE — ED Provider Notes (Signed)
[place] EMERGENCY DEPARTMENT AT [facility] Provider Note   CSN: [id number] Arrival date & time: 05/11/23  1213     History  Chief Complaint  Patient presents with   Marletta Lor    [name] is a 72 y.o. female.  Patient to ED after fall at home. She was standing on the 2nd rung of an attic ladder and fell onto her left side. She was unable to get up from the floor and reports severe pain in the pubic area and low back. No abdominal pain. She denies hitting her head. She is not anticoagulated.   The history is provided by the patient. No language interpreter was used.  Fall       Home Medications Prior to Admission medications   Medication Sig Start Date End Date Taking? Authorizing Provider  Ascorbic Acid (VITAMIN C) 1000 MG tablet Take 1,000 mg by mouth daily.   Yes [provider]  buPROPion (WELLBUTRIN XL) 150 MG 24 hr tablet TAKE 1 TABLET BY MOUTH EVERY DAY 04/24/23  Yes Luking, Scott A, MD  cyanocobalamin (VITAMIN B12) 1000 MCG tablet Take 1,000 mcg by mouth See admin instructions. Take 1 tablet ( ) every other night.   Yes [provider]  Ginger, Zingiber officinalis, (GINGER PO) Take 1,000 mg by mouth daily.   Yes [provider]  levocetirizine (XYZAL) 5 MG tablet Take 5 mg by mouth every evening.   Yes [provider]  montelukast (SINGULAIR) 10 MG tablet TAKE 1 TABLET BY MOUTH AT BEDTIME 04/17/23  Yes Luking, Jonna Coup, MD  Multiple Vitamins-Minerals (CENTRUM SILVER 50+WOMEN) TABS Take 1 tablet by mouth daily.   Yes [provider]  pantoprazole (PROTONIX) 40 MG tablet TAKE 1 TABLET BY MOUTH EVERY DAY AS NEEDED FOR ACID REFLUX Patient taking differently: Take 40 mg by mouth at bedtime. 04/17/23  Yes Campbell Riches, NP  rizatriptan (MAXALT) 10 MG tablet Take one at onset of migraine; may repeat in 2 hours if needed; max 2 per 24 hours 08/06/22  Yes Luking, Scott A, MD  rOPINIRole (REQUIP) 0.5 MG tablet  TAKE 1 TABLET BY MOUTH AT BEDTIME. Patient taking differently: Take 0.5 mg by mouth See admin instructions. Take 1 tablet (0.5mg ) at bedtime and another tablet around 0400-0500 in the morning. 12/30/22  Yes Luking, Jonna Coup, MD  rosuvastatin (CRESTOR) 10 MG tablet Take 1 tablet (10 mg total) by mouth daily. For cholesterol 03/31/23  Yes Sherie Don C, NP  sertraline (ZOLOFT) 50 MG tablet TAKE 1 TABLET BY MOUTH EVERY DAY Patient taking differently: Take 25 mg by mouth at bedtime. TAKE 1 TABLET BY MOUTH EVERY DAY 07/27/22  Yes Campbell Riches, NP  topiramate (TOPAMAX) 100 MG tablet Take 100 mg by mouth 2 (two) times daily.   Yes [provider]  traZODone (DESYREL) 50 MG tablet TAKE 1/2 TO 1 TABLET BY MOUTH AT BEDTIME AS NEEDED FOR SLEEP 02/05/23  Yes Campbell Riches, NP      Allergies    Beta adrenergic blockers, Ambien [zolpidem tartrate], Biaxin [clarithromycin], Cymbalta [duloxetine hcl], and Fosamax [alendronate sodium]    Review of Systems   Review of Systems  Physical Exam Updated Vital Signs BP 115/74   Pulse 69   Temp 98 F (36.7 C) (Oral)   Resp 19   Ht 5\' 4"  (1.626 m)   Wt 78.9 kg   SpO2 96%   BMI 29.87 kg/m  Physical Exam Vitals and nursing note reviewed.  Constitutional:  Appearance: Normal appearance.  HENT:     Head: Normocephalic and atraumatic.  Neck:     Comments: No midline cervical tenderness.  Cardiovascular:     Rate and Rhythm: Normal rate and regular rhythm.  Pulmonary:     Effort: Pulmonary effort is normal.  Chest:     Chest wall: No tenderness.  Abdominal:     General: There is no distension.     Palpations: Abdomen is soft.     Tenderness: There is no abdominal tenderness.  Musculoskeletal:     Cervical back: Normal range of motion and neck supple.     Comments: There is very mild shortening of the left leg. No rotation. Hip is moderately tender. She is markedly tender over the mid pelvis.   Skin:    General: Skin is warm  and dry.     Findings: No lesion.  Neurological:     Mental Status: She is alert.     ED Results / Procedures / Treatments   Labs (all labs ordered are listed, but only abnormal results are displayed) Labs Reviewed  CBC WITH DIFFERENTIAL/PLATELET - Abnormal; Notable for the following components:      Result Value   WBC 11.6 (*)    Neutro Abs 8.1 (*)    Abs Immature Granulocytes 0.08 (*)    All other components within normal limits  URINALYSIS, ROUTINE W REFLEX MICROSCOPIC - Abnormal; Notable for the following components:   APPearance CLOUDY (*)    All other components within normal limits  BASIC METABOLIC PANEL   Results for orders placed or performed during the hospital encounter of 05/11/23  CBC with Differential  Result Value Ref Range   WBC 11.6 (H) 4.0 - 10.5 K/uL   RBC 4.33 3.87 - 5.11 MIL/uL   Hemoglobin 13.5 12.0 - 15.0 g/dL   HCT 47.8 29.5 - 62.1 %   MCV 98.2 80.0 - 100.0 fL   MCH 31.2 26.0 - 34.0 pg   MCHC 31.8 30.0 - 36.0 g/dL   RDW 30.8 65.7 - 84.6 %   Platelets 233 150 - 400 K/uL   nRBC 0.0 0.0 - 0.2 %   Neutrophils Relative % 70 %   Neutro Abs 8.1 (H) 1.7 - 7.7 K/uL   Lymphocytes Relative 20 %   Lymphs Abs 2.4 0.7 - 4.0 K/uL   Monocytes Relative 7 %   Monocytes Absolute 0.8 0.1 - 1.0 K/uL   Eosinophils Relative 2 %   Eosinophils Absolute 0.2 0.0 - 0.5 K/uL   Basophils Relative 0 %   Basophils Absolute 0.0 0.0 - 0.1 K/uL   Immature Granulocytes 1 %   Abs Immature Granulocytes 0.08 (H) 0.00 - 0.07 K/uL  Basic metabolic panel  Result Value Ref Range   Sodium 141 135 - 145 mmol/L   Potassium 4.4 3.5 - 5.1 mmol/L   Chloride 105 98 - 111 mmol/L   CO2 25 22 - 32 mmol/L   Glucose, Bld 99 70 - 99 mg/dL   BUN 17 8 - 23 mg/dL   Creatinine, Ser 9.62 0.44 - 1.00 mg/dL   Calcium 9.3 8.9 - 95.2 mg/dL   GFR, Estimated >84 >13 mL/min   Anion gap 11 5 - 15  Urinalysis, Routine w reflex microscopic -Urine, Clean Catch  Result Value Ref Range   Color, Urine  YELLOW YELLOW   APPearance CLOUDY (A) CLEAR   Specific Gravity, Urine 1.011 1.005 - 1.030   pH 8.0 5.0 - 8.0   Glucose,  UA NEGATIVE NEGATIVE mg/dL   Hgb urine dipstick NEGATIVE NEGATIVE   Bilirubin Urine NEGATIVE NEGATIVE   Ketones, ur NEGATIVE NEGATIVE mg/dL   Protein, ur NEGATIVE NEGATIVE mg/dL   Nitrite NEGATIVE NEGATIVE   Leukocytes,Ua NEGATIVE NEGATIVE   N EKG None  Radiology CT PELVIS WO CONTRAST  Result Date: 05/11/2023 CLINICAL DATA:  Larey Seat, right inguinal and left hip pain, possible right parasymphyseal fracture on x-ray EXAM: CT PELVIS WITHOUT CONTRAST TECHNIQUE: Multidetector CT imaging of the pelvis was performed following the standard protocol without intravenous contrast. RADIATION DOSE REDUCTION: This exam was performed according to the departmental dose-optimization program which includes automated exposure control, adjustment of the mA and/or kV according to patient size and/or use of iterative reconstruction technique. COMPARISON:  11/20/2016, 05/11/2023 FINDINGS: Urinary Tract:  Distal ureters and bladder are unremarkable. Bowel: No bowel obstruction or ileus. No bowel wall thickening or inflammatory change. Vascular/Lymphatic: Atherosclerosis of the aorta and iliac vessels. No pathologic adenopathy. Reproductive:  Uterus and adnexal structures are unremarkable. Other: No free fluid or free intraperitoneal gas. No abdominal wall hernia. Musculoskeletal: There is a comminuted mildly displaced right parasymphyseal fracture extending into the right superior pubic ramus, corresponding to prior x-ray findings. Alignment is near anatomic. There is a nondisplaced fracture in the sagittal plane through the lateral margin of the left sacral ala. No other acute pelvic fractures. Symmetrical bilateral hip osteoarthritis. Severe spondylosis and facet hypertrophy at the L5-S1 level, with severe bilateral neural foraminal encroachment. Reconstructed images demonstrate no additional findings.  IMPRESSION: 1. Acute comminuted minimally displaced right parasymphyseal fracture extending of the right superior pubic ramus, corresponding to x-ray findings. Alignment is near anatomic. 2. Nondisplaced acute fracture through the lateral aspect of the left sacral ala. 3. Severe spondylosis and facet hypertrophy at the L5-S1 level, with severe bilateral neural foraminal encroachment. 4.  Aortic Atherosclerosis (ICD10-I70.0). Electronically Signed   By: Sharlet Salina M.D.   On: 05/11/2023 16:44   DG Hip Unilat W or Wo Pelvis 2-3 Views Left  Result Date: 05/11/2023 CLINICAL DATA:  Fall.  Right groin and left hip pain. EXAM: DG HIP (WITH OR WITHOUT PELVIS) 2-3V LEFT; LUMBAR SPINE - COMPLETE 4+ VIEW COMPARISON:  11/01/2016 abdominopelvic CT. FINDINGS: Lumbar spine: Cholecystectomy. Sacroiliac joints are symmetric. Mild limitations secondary to cross-table lateral technique. Grossly maintained vertebral body height. Intervertebral disc heights are maintained. No dedicated lumbosacral spine image submitted. The second oblique image is suboptimal secondary to positioning. Left hip: AP view of the pelvis and AP/cross-table lateral views of the left hip. No frog-leg view is submitted. Femoral heads are located. Sacroiliac joints are symmetric. Cross-table lateral view is suboptimal secondary to positioning and patient's size. Subtle osseous irregularity involving the parasymphyseal right pubic bone. IMPRESSION: Mild technique limitations as detailed above. Osseous irregularity involving the right parasymphyseal pubic bone. Cannot exclude fracture. Correlate with point tenderness and consider CT. Otherwise, no acute finding about the lumbar spine or left hip. Electronically Signed   By: Jeronimo Greaves M.D.   On: 05/11/2023 14:29   DG Lumbar Spine Complete  Result Date: 05/11/2023 CLINICAL DATA:  Fall.  Right groin and left hip pain. EXAM: DG HIP (WITH OR WITHOUT PELVIS) 2-3V LEFT; LUMBAR SPINE - COMPLETE 4+ VIEW  COMPARISON:  11/01/2016 abdominopelvic CT. FINDINGS: Lumbar spine: Cholecystectomy. Sacroiliac joints are symmetric. Mild limitations secondary to cross-table lateral technique. Grossly maintained vertebral body height. Intervertebral disc heights are maintained. No dedicated lumbosacral spine image submitted. The second oblique image is suboptimal secondary to positioning. Left  hip: AP view of the pelvis and AP/cross-table lateral views of the left hip. No frog-leg view is submitted. Femoral heads are located. Sacroiliac joints are symmetric. Cross-table lateral view is suboptimal secondary to positioning and patient's size. Subtle osseous irregularity involving the parasymphyseal right pubic bone. IMPRESSION: Mild technique limitations as detailed above. Osseous irregularity involving the right parasymphyseal pubic bone. Cannot exclude fracture. Correlate with point tenderness and consider CT. Otherwise, no acute finding about the lumbar spine or left hip. Electronically Signed   By: Jeronimo Greaves M.D.   On: 05/11/2023 14:29   DG Chest 1 View  Result Date: 05/11/2023 CLINICAL DATA:  Status post fall.  Right groin and left hip pain. EXAM: CHEST  1 VIEW COMPARISON:  Radiographs 06/05/2020 and 01/12/2016. FINDINGS: 1349 hours. Lordotic positioning. Left subclavian pacemaker leads appear unchanged, projecting over the right atrium and right ventricle. The heart size and mediastinal contours are stable. Stable mild biapical scarring. The lungs are otherwise clear. No evidence of pleural effusion or pneumothorax. No acute fractures are identified in the chest. IMPRESSION: No evidence of acute chest injury. Stable chest radiograph. Electronically Signed   By: Carey Bullocks M.D.   On: 05/11/2023 14:28    Procedures Procedures    Medications Ordered in ED Medications  fentaNYL (SUBLIMAZE) injection 50 mcg (50 mcg Intravenous Given 05/11/23 1306)  morphine (PF) 4 MG/ML injection 4 mg (4 mg Intravenous Given  05/11/23 1452)  morphine (PF) 4 MG/ML injection 4 mg (4 mg Intravenous Given 05/11/23 1807)    ED Course/ Medical Decision Making/ A&P                                 Medical Decision Making This patient presents to the ED for concern of pelvic pain after fall, this involves an extensive number of treatment options, and is a complaint that carries with it a high risk of complications and morbidity.  The differential diagnosis includes fracture pelvis, fracture hip, fracture lumbar spine, bladder injury   Co morbidities that complicate the patient evaluation  None   Additional history obtained:  Additional history and/or information obtained from chart review, notable for Husband at bedside, corroborates history   Lab Tests:  I Ordered, and personally interpreted labs.  The pertinent results include:  No significant lab abnormality. WBC 11.6    Imaging Studies ordered:  I ordered imaging studies including Lumbar, pelvis with left hip xrays Radiologist interpretation - subtle parasymphyseal right public bone fracture, poorly visualized. Lumbar and hip xrays negative.    Cardiac Monitoring:  The patient was maintained on a cardiac monitor.  I personally viewed and interpreted the cardiac monitored which showed an underlying rhythm of: NSR, rate 67   Medicines ordered and prescription drug management:  I ordered medication including Fentanyl  for pain Reevaluation of the patient after these medicines showed that the patient got temporary relief Morphine subsequently ordered with better pain relief.  I have reviewed the patients home medicines and have made adjustments as needed   Test Considered:  After re-evaluation, the patient reports pain is improved but still cannot tolerate movement of the lower extremities. Xray of the pelvis was limited by rotation. Will obtain CT pelvis to characterize the fracture further.  Patient continues to be unable to empty her bladder even  though she feels her bladder is full. Bladder scan ordered with foley to follow for residual.     Consultations Obtained:  I requested consultation with the ED attending, Dr. Estell Harpin,  and discussed lab and imaging findings as well as pertinent plan - they agree with admission for pain control.    Problem List / ED Course:  Fall with left hip, back and pelvic pain Subtle parasymphyseal pubic bone fracture only finding on xrays Patient still unable to empty her bladder but feels she has a full bladder.  CT pelvis ordered for further characterization of injury  CT pelvis shows well aligned right parasymphyseal fracture as well as left sacral ala fracture, nondisplaced.   The patient is still having significant pain, difficulty moving in the bed. Attempt at urination is also very painful. Foley catheter placed for comfort.     Reevaluation:  After the interventions noted above, I reevaluated the patient and found that they have :improved   Social Determinants of Health:  Is husband's caregiver at home   Disposition:  After consideration of the diagnostic results and the patients response to treatment, I feel that the patient would benefit from Admission for pain control, possible rehab placement before returning home. .   Amount and/or Complexity of Data Reviewed Labs: ordered. Radiology: ordered.  Risk Prescription drug management. Decision regarding hospitalization.           Final Clinical Impression(s) / ED Diagnoses Final diagnoses:  Closed fracture of other parts of pelvis, initial encounter [facility])    Rx / DC Orders ED Discharge Orders     None         Danne Harbor 05/11/23 1936    Jacalyn Lefevre, MD 05/14/23 1535

## 2023-05-11 NOTE — Assessment & Plan Note (Signed)
Resume twice daily Topamax

## 2023-05-11 NOTE — H&P (Signed)
History and Physical    [name] ZOX:[id number] DOB: 1950/11/07 DOA: 05/11/2023  PCP: Babs Sciara, MD   Patient coming from: Home  I have personally briefly reviewed patient's old medical records in [facility]  Chief Complaint: Fall  HPI: [name] is a 72 y.o. female with medical history significant for Complete heart block with pacemaker, IBS, Asthma, Migraine, paroxysmal atria tachycardia. Patient was brought to the ED via EMS for reports of a fall, getting Christmas decorations from the attic.Marland Kitchen  Her son-in-law was sliding down an item- ~ 45 pounds, she was 3-4 rungs up a ladder,with the sudden weight handed to her, she fell to the floor.  She did not hit her head she did not lose consciousness.  She denies chest pain no dizziness.  She has had good oral intake no vomiting no diarrhea.  ED Course: Heart rate 68-79.  Blood pressure systolic 93-132.  EKG shows first-degree AV block, LBBB. Pelvic x-ray with osseous irregularity involving the right parasymphyseal pubic bone. Cannot exclude fracture.  CT pelvis-acute comminuted minimally displaced right parasymphyseal fracture extending to right superior pubic rami.  Nondisplaced acute fracture to the lateral aspect of left sacral ala Hospitalist to admit for mechanical fall, pelvic fracture, unable to ambulate.  Review of Systems: As per HPI all other systems reviewed and negative.  Past Medical History:  Diagnosis Date   Acid reflux    Allergy    Anxiety    Asthma    Complete heart block (HCC) 09/2015   a. s/p [employer] Jude PPM 09/2015.   Dyspnea    Former tobacco use    Hyperlipidemia    Irritable bowel syndrome (IBS) 02/27/2017   Mast cell disease    Migraines    Chronic   PAT (paroxysmal atrial tachycardia) (HCC)    PONV (postoperative nausea and vomiting)    Presence of permanent cardiac pacemaker 09/19/2015   Sinusitis    Sleep apnea    pt denies   Urticaria     Past Surgical History:   Procedure Laterality Date   APPENDECTOMY     AUGMENTATION MAMMAPLASTY Bilateral    BIOPSY  06/17/2017   Procedure: BIOPSY;  Surgeon: West Bali, MD;  Location: [facility];  Service: Endoscopy;;  colon   BREAST ENHANCEMENT SURGERY  1985   CATARACT EXTRACTION W/PHACO Right 01/31/2023   Procedure: CATARACT EXTRACTION PHACO AND INTRAOCULAR LENS PLACEMENT (IOC);  Surgeon: Fabio Pierce, MD;  Location: [facility];  Service: Ophthalmology;  Laterality: Right;  CDE 6.69   CATARACT EXTRACTION W/PHACO Left 02/24/2023   Procedure: CATARACT EXTRACTION PHACO AND INTRAOCULAR LENS PLACEMENT (IOC);  Surgeon: Fabio Pierce, MD;  Location: [facility];  Service: Ophthalmology;  Laterality: Left;  CDE: 4.87   CHOLECYSTECTOMY     COLONOSCOPY     COLONOSCOPY WITH PROPOFOL N/A 06/17/2017   Dr. Darrick Penna: 30 mm polyp removed from the ascending colon piecemeal, tattooed, tubular adenoma.  8 mm polyp in the ascending colon, four 3 to 5 mm polyps removed from the rectum and transverse colon.  Tubular adenomas.  External and internal hemorrhoids.  Redundant colon.  Random colon biopsies negative.  Next colonoscopy in 3 years.   EP IMPLANTABLE DEVICE N/A 09/19/2015   Procedure: Pacemaker Implant;  Surgeon: Will Jorja Loa, MD;  Location: [facility];  Service: Cardiovascular;  Laterality: N/A;   ESOPHAGOGASTRODUODENOSCOPY (EGD) WITH PROPOFOL N/A 06/17/2017   Dr. Darrick Penna: Medium sized hiatal hernia, mild erosive gastritis, small bowel biopsies negative  for celiac.  Gastric biopsies with chronic mild inactive gastritis but no H. pylori.   INSERT / REPLACE / REMOVE PACEMAKER     POLYPECTOMY  06/17/2017   Procedure: POLYPECTOMY;  Surgeon: West Bali, MD;  Location: [facility];  Service: Endoscopy;;  colon   TONSILLECTOMY AND ADENOIDECTOMY     TUBAL LIGATION       reports that she quit smoking about 18 years ago. Her smoking use included cigarettes. She started smoking about 38 years ago. She has a 30 pack-year  smoking history. She has never used smokeless tobacco. She reports that she does not drink alcohol and does not use drugs.  Allergies  Allergen Reactions   Beta Adrenergic Blockers Shortness Of Breath   Ambien [Zolpidem Tartrate] Other (See Comments)    Hallucinations   Biaxin [Clarithromycin] Hives    May have caused hives see 06/19/15   Cymbalta [Duloxetine Hcl] Other (See Comments)    Vivid nightmares Grogginess   Fosamax [Alendronate Sodium] Other (See Comments)    Gastritis     Family History  Problem Relation Age of Onset   Congestive Heart Failure Mother    Diabetes Mother    Dementia Mother    Parkinson's disease Mother    Lung cancer Father    Diabetes Sister    Diabetes Maternal Grandmother    Allergic rhinitis Maternal Grandmother    Asthma Maternal Grandmother    Cancer Maternal Grandfather    Diabetes Paternal Grandmother    Heart attack Paternal Grandmother    Heart disease Paternal Grandmother    Asthma Paternal Grandmother    Angioedema Neg Hx    Eczema Neg Hx    Urticaria Neg Hx     Prior to Admission medications   Medication Sig Start Date End Date Taking? Authorizing Provider  Ascorbic Acid (VITAMIN C) 1000 MG tablet Take 1,000 mg by mouth daily.   Yes [provider]  buPROPion (WELLBUTRIN XL) 150 MG 24 hr tablet TAKE 1 TABLET BY MOUTH EVERY DAY 04/24/23  Yes Luking, Scott A, MD  cyanocobalamin (VITAMIN B12) 1000 MCG tablet Take 1,000 mcg by mouth See admin instructions. Take 1 tablet ( ) every other night.   Yes [provider]  Ginger, Zingiber officinalis, (GINGER PO) Take 1,000 mg by mouth daily.   Yes [provider]  levocetirizine (XYZAL) 5 MG tablet Take 5 mg by mouth every evening.   Yes [provider]  montelukast (SINGULAIR) 10 MG tablet TAKE 1 TABLET BY MOUTH AT BEDTIME 04/17/23  Yes Luking, Jonna Coup, MD  Multiple Vitamins-Minerals (CENTRUM SILVER 50+WOMEN) TABS Take 1 tablet by mouth daily.   Yes  [provider]  pantoprazole (PROTONIX) 40 MG tablet TAKE 1 TABLET BY MOUTH EVERY DAY AS NEEDED FOR ACID REFLUX Patient taking differently: Take 40 mg by mouth at bedtime. 04/17/23  Yes Campbell Riches, NP  rizatriptan (MAXALT) 10 MG tablet Take one at onset of migraine; may repeat in 2 hours if needed; max 2 per 24 hours 08/06/22  Yes Luking, Scott A, MD  rOPINIRole (REQUIP) 0.5 MG tablet TAKE 1 TABLET BY MOUTH AT BEDTIME. Patient taking differently: Take 0.5 mg by mouth See admin instructions. Take 1 tablet (0.5mg ) at bedtime and another tablet around 0400-0500 in the morning. 12/30/22  Yes Luking, Jonna Coup, MD  rosuvastatin (CRESTOR) 10 MG tablet Take 1 tablet (10 mg total) by mouth daily. For cholesterol 03/31/23  Yes Sherie Don C, NP  sertraline (ZOLOFT) 50 MG  tablet TAKE 1 TABLET BY MOUTH EVERY DAY Patient taking differently: Take 25 mg by mouth at bedtime. TAKE 1 TABLET BY MOUTH EVERY DAY 07/27/22  Yes Campbell Riches, NP  topiramate (TOPAMAX) 100 MG tablet Take 100 mg by mouth 2 (two) times daily.   Yes [provider]  traZODone (DESYREL) 50 MG tablet TAKE 1/2 TO 1 TABLET BY MOUTH AT BEDTIME AS NEEDED FOR SLEEP 02/05/23  Yes Campbell Riches, NP    Physical Exam: Vitals:   05/11/23 1615 05/11/23 1810 05/11/23 1815 05/11/23 1830  BP: 93/78 (!) 106/42  115/74  Pulse:  75 69   Resp: 18 14 19 19   Temp:      TempSrc:      SpO2:  95% 96%   Weight:      Height:        Constitutional: NAD, calm, comfortable Vitals:   05/11/23 1615 05/11/23 1810 05/11/23 1815 05/11/23 1830  BP: 93/78 (!) 106/42  115/74  Pulse:  75 69   Resp: 18 14 19 19   Temp:      TempSrc:      SpO2:  95% 96%   Weight:      Height:       Eyes: PERRL, lids and conjunctivae normal ENMT: Mucous membranes are moist.   Neck: normal, supple, no masses, no thyromegaly Respiratory: clear to auscultation bilaterally, no wheezing, no crackles. Normal respiratory effort. No accessory muscle  use.  Cardiovascular: Regular rate and rhythm, no murmurs / rubs / gallops. No extremity edema.  Abdomen: no tenderness, no masses palpated. No hepatosplenomegaly. Bowel sounds positive.  Musculoskeletal: no clubbing / cyanosis. No joint deformity upper and lower extremities. Skin: no rashes, lesions, ulcers. No induration Neurologic: No facial asymmetry, speech fluent, able to move upper extremities, lower extremities to lesser extent due to pain Psychiatric: Normal judgment and insight. Alert and oriented x 3. Normal mood.   Labs on Admission: I have personally reviewed following labs and imaging studies  CBC: Recent Labs  Lab 05/11/23 1319  WBC 11.6*  NEUTROABS 8.1*  HGB 13.5  HCT 42.5  MCV 98.2  PLT 233   Basic Metabolic Panel: Recent Labs  Lab 05/11/23 1319  NA 141  K 4.4  CL 105  CO2 25  GLUCOSE 99  BUN 17  CREATININE 0.85  CALCIUM 9.3    Urine analysis:    Component Value Date/Time   COLORURINE YELLOW 05/11/2023 1755   APPEARANCEUR CLOUDY (A) 05/11/2023 1755   LABSPEC 1.011 05/11/2023 1755   PHURINE 8.0 05/11/2023 1755   GLUCOSEU NEGATIVE 05/11/2023 1755   HGBUR NEGATIVE 05/11/2023 1755   BILIRUBINUR NEGATIVE 05/11/2023 1755   KETONESUR NEGATIVE 05/11/2023 1755   PROTEINUR NEGATIVE 05/11/2023 1755   UROBILINOGEN 0.2 07/19/2008 0629   NITRITE NEGATIVE 05/11/2023 1755   LEUKOCYTESUR NEGATIVE 05/11/2023 1755    Radiological Exams on Admission: CT PELVIS WO CONTRAST  Result Date: 05/11/2023 CLINICAL DATA:  Larey Seat, right inguinal and left hip pain, possible right parasymphyseal fracture on x-ray EXAM: CT PELVIS WITHOUT CONTRAST TECHNIQUE: Multidetector CT imaging of the pelvis was performed following the standard protocol without intravenous contrast. RADIATION DOSE REDUCTION: This exam was performed according to the departmental dose-optimization program which includes automated exposure control, adjustment of the mA and/or kV according to patient size and/or  use of iterative reconstruction technique. COMPARISON:  11/20/2016, 05/11/2023 FINDINGS: Urinary Tract:  Distal ureters and bladder are unremarkable. Bowel: No bowel obstruction or ileus. No bowel wall thickening or inflammatory  change. Vascular/Lymphatic: Atherosclerosis of the aorta and iliac vessels. No pathologic adenopathy. Reproductive:  Uterus and adnexal structures are unremarkable. Other: No free fluid or free intraperitoneal gas. No abdominal wall hernia. Musculoskeletal: There is a comminuted mildly displaced right parasymphyseal fracture extending into the right superior pubic ramus, corresponding to prior x-ray findings. Alignment is near anatomic. There is a nondisplaced fracture in the sagittal plane through the lateral margin of the left sacral ala. No other acute pelvic fractures. Symmetrical bilateral hip osteoarthritis. Severe spondylosis and facet hypertrophy at the L5-S1 level, with severe bilateral neural foraminal encroachment. Reconstructed images demonstrate no additional findings. IMPRESSION: 1. Acute comminuted minimally displaced right parasymphyseal fracture extending of the right superior pubic ramus, corresponding to x-ray findings. Alignment is near anatomic. 2. Nondisplaced acute fracture through the lateral aspect of the left sacral ala. 3. Severe spondylosis and facet hypertrophy at the L5-S1 level, with severe bilateral neural foraminal encroachment. 4.  Aortic Atherosclerosis (ICD10-I70.0). Electronically Signed   By: Sharlet Salina M.D.   On: 05/11/2023 16:44   DG Hip Unilat W or Wo Pelvis 2-3 Views Left  Result Date: 05/11/2023 CLINICAL DATA:  Fall.  Right groin and left hip pain. EXAM: DG HIP (WITH OR WITHOUT PELVIS) 2-3V LEFT; LUMBAR SPINE - COMPLETE 4+ VIEW COMPARISON:  11/01/2016 abdominopelvic CT. FINDINGS: Lumbar spine: Cholecystectomy. Sacroiliac joints are symmetric. Mild limitations secondary to cross-table lateral technique. Grossly maintained vertebral body  height. Intervertebral disc heights are maintained. No dedicated lumbosacral spine image submitted. The second oblique image is suboptimal secondary to positioning. Left hip: AP view of the pelvis and AP/cross-table lateral views of the left hip. No frog-leg view is submitted. Femoral heads are located. Sacroiliac joints are symmetric. Cross-table lateral view is suboptimal secondary to positioning and patient's size. Subtle osseous irregularity involving the parasymphyseal right pubic bone. IMPRESSION: Mild technique limitations as detailed above. Osseous irregularity involving the right parasymphyseal pubic bone. Cannot exclude fracture. Correlate with point tenderness and consider CT. Otherwise, no acute finding about the lumbar spine or left hip. Electronically Signed   By: Jeronimo Greaves M.D.   On: 05/11/2023 14:29   DG Lumbar Spine Complete  Result Date: 05/11/2023 CLINICAL DATA:  Fall.  Right groin and left hip pain. EXAM: DG HIP (WITH OR WITHOUT PELVIS) 2-3V LEFT; LUMBAR SPINE - COMPLETE 4+ VIEW COMPARISON:  11/01/2016 abdominopelvic CT. FINDINGS: Lumbar spine: Cholecystectomy. Sacroiliac joints are symmetric. Mild limitations secondary to cross-table lateral technique. Grossly maintained vertebral body height. Intervertebral disc heights are maintained. No dedicated lumbosacral spine image submitted. The second oblique image is suboptimal secondary to positioning. Left hip: AP view of the pelvis and AP/cross-table lateral views of the left hip. No frog-leg view is submitted. Femoral heads are located. Sacroiliac joints are symmetric. Cross-table lateral view is suboptimal secondary to positioning and patient's size. Subtle osseous irregularity involving the parasymphyseal right pubic bone. IMPRESSION: Mild technique limitations as detailed above. Osseous irregularity involving the right parasymphyseal pubic bone. Cannot exclude fracture. Correlate with point tenderness and consider CT. Otherwise, no acute  finding about the lumbar spine or left hip. Electronically Signed   By: Jeronimo Greaves M.D.   On: 05/11/2023 14:29   DG Chest 1 View  Result Date: 05/11/2023 CLINICAL DATA:  Status post fall.  Right groin and left hip pain. EXAM: CHEST  1 VIEW COMPARISON:  Radiographs 06/05/2020 and 01/12/2016. FINDINGS: 1349 hours. Lordotic positioning. Left subclavian pacemaker leads appear unchanged, projecting over the right atrium and right ventricle. The heart size  and mediastinal contours are stable. Stable mild biapical scarring. The lungs are otherwise clear. No evidence of pleural effusion or pneumothorax. No acute fractures are identified in the chest. IMPRESSION: No evidence of acute chest injury. Stable chest radiograph. Electronically Signed   By: Carey Bullocks M.D.   On: 05/11/2023 14:28    EKG: Independently reviewed.  Sinus, rate 75, first-degree AV block PR interval 252.  Assessment/Plan Principal Problem:   Fall Active Problems:   Pelvic fracture (HCC)   Migraines   Depression with anxiety   Complete heart block (HCC)  Assessment and Plan: * Fall Mechanical fall with pelvic fracture, unable to ambulate, will likely need placement.  CT pelvis - acute comminuted minimally displaced right parasymphyseal fracture extending of the right superior pubic ramus.  -IV morphine 4 mg every 4 hours as needed -PT eval  -Ortho consult -Blood pressure soft, N/s 100 cc/h x 10 hours  Complete heart block (HCC) Complete heart block with pacemaker.  Follows with Dr. Elberta Fortis  Migraines Resume twice daily Topamax   DVT prophylaxis: Lovenox Code Status: FULL Family Communication:  None at bedside Disposition Plan: ~ 2 days Consults called: Ortho Admission status:  Obs med surg    Author: Onnie Boer, MD 05/11/2023 8:17 PM  For on call review [email or web address].

## 2023-05-12 DIAGNOSIS — W19XXXD Unspecified fall, subsequent encounter: Secondary | ICD-10-CM

## 2023-05-12 DIAGNOSIS — F419 Anxiety disorder, unspecified: Secondary | ICD-10-CM | POA: Diagnosis present

## 2023-05-12 DIAGNOSIS — G2581 Restless legs syndrome: Secondary | ICD-10-CM | POA: Diagnosis present

## 2023-05-12 DIAGNOSIS — F418 Other specified anxiety disorders: Secondary | ICD-10-CM | POA: Diagnosis not present

## 2023-05-12 DIAGNOSIS — I442 Atrioventricular block, complete: Secondary | ICD-10-CM | POA: Diagnosis not present

## 2023-05-12 DIAGNOSIS — Z961 Presence of intraocular lens: Secondary | ICD-10-CM | POA: Diagnosis present

## 2023-05-12 DIAGNOSIS — Z9049 Acquired absence of other specified parts of digestive tract: Secondary | ICD-10-CM | POA: Diagnosis not present

## 2023-05-12 DIAGNOSIS — E785 Hyperlipidemia, unspecified: Secondary | ICD-10-CM | POA: Diagnosis present

## 2023-05-12 DIAGNOSIS — Z881 Allergy status to other antibiotic agents status: Secondary | ICD-10-CM | POA: Diagnosis not present

## 2023-05-12 DIAGNOSIS — Z888 Allergy status to other drugs, medicaments and biological substances status: Secondary | ICD-10-CM | POA: Diagnosis not present

## 2023-05-12 DIAGNOSIS — G43709 Chronic migraine without aura, not intractable, without status migrainosus: Secondary | ICD-10-CM

## 2023-05-12 DIAGNOSIS — Z87891 Personal history of nicotine dependence: Secondary | ICD-10-CM | POA: Diagnosis not present

## 2023-05-12 DIAGNOSIS — S3289XA Fracture of other parts of pelvis, initial encounter for closed fracture: Secondary | ICD-10-CM | POA: Diagnosis not present

## 2023-05-12 DIAGNOSIS — Z950 Presence of cardiac pacemaker: Secondary | ICD-10-CM | POA: Diagnosis not present

## 2023-05-12 DIAGNOSIS — Z79899 Other long term (current) drug therapy: Secondary | ICD-10-CM | POA: Diagnosis not present

## 2023-05-12 DIAGNOSIS — W11XXXA Fall on and from ladder, initial encounter: Secondary | ICD-10-CM | POA: Diagnosis present

## 2023-05-12 DIAGNOSIS — Z8249 Family history of ischemic heart disease and other diseases of the circulatory system: Secondary | ICD-10-CM | POA: Diagnosis not present

## 2023-05-12 DIAGNOSIS — F32A Depression, unspecified: Secondary | ICD-10-CM | POA: Diagnosis present

## 2023-05-12 DIAGNOSIS — K219 Gastro-esophageal reflux disease without esophagitis: Secondary | ICD-10-CM | POA: Diagnosis present

## 2023-05-12 DIAGNOSIS — S32119A Unspecified Zone I fracture of sacrum, initial encounter for closed fracture: Secondary | ICD-10-CM | POA: Diagnosis present

## 2023-05-12 DIAGNOSIS — S329XXA Fracture of unspecified parts of lumbosacral spine and pelvis, initial encounter for closed fracture: Secondary | ICD-10-CM | POA: Diagnosis not present

## 2023-05-12 MED ORDER — IBUPROFEN 400 MG PO TABS
400.0000 mg | ORAL_TABLET | Freq: Three times a day (TID) | ORAL | Status: DC | PRN
  Administered 2023-05-12 – 2023-05-13 (×2): 400 mg via ORAL
  Filled 2023-05-12 (×2): qty 1

## 2023-05-12 MED ORDER — CHLORHEXIDINE GLUCONATE CLOTH 2 % EX PADS
6.0000 | MEDICATED_PAD | Freq: Every day | CUTANEOUS | Status: DC
  Administered 2023-05-12: 6 via TOPICAL

## 2023-05-12 MED ORDER — PANTOPRAZOLE SODIUM 40 MG PO TBEC
40.0000 mg | DELAYED_RELEASE_TABLET | Freq: Two times a day (BID) | ORAL | Status: DC
  Administered 2023-05-12 – 2023-05-13 (×3): 40 mg via ORAL
  Filled 2023-05-12 (×3): qty 1

## 2023-05-12 NOTE — Plan of Care (Signed)

## 2023-05-12 NOTE — Evaluation (Signed)
Physical Therapy Evaluation Patient Details Name: [name] MRN: [id number] DOB: 1951/04/16 Today's Date: 05/12/2023  History of Present Illness  [name] is a 72 y.o. female with medical history significant for Complete heart block with pacemaker, IBS, Asthma, Migraine, paroxysmal atria tachycardia.  Patient was brought to the ED via EMS for reports of a fall, getting Christmas decorations from the attic.Marland Kitchen  Her son-in-law was sliding down an item- ~ 45 pounds, she was 3-4 rungs up a ladder,with the sudden weight handed to her, she fell to the floor.  She did not hit her head she did not lose consciousness.  She denies chest pain no dizziness.  She has had good oral intake no vomiting no diarrhea.   Clinical Impression  Patient demonstrates slow labored movement for sitting up at bedside with c/o severe left hip/groin pain, able to complete sit to stand with labored movement and tolerated taking a few side steps, but unable to step away from bedside due to increasing pain.  Patient tolerated sitting up in chair after therapy with family members present. Patient states her family will be able to take care of her at home, her daughter confirms.  Patient will benefit from continued skilled physical therapy in hospital and recommended venue below to increase strength, balance, endurance for safe ADLs and gait.           If plan is discharge home, recommend the following: A lot of help with bathing/dressing/bathroom;A lot of help with walking and/or transfers;Help with stairs or ramp for entrance;Assistance with cooking/housework   Can travel by private vehicle        Equipment Recommendations BSC/3in1;Rolling walker (2 wheels)  Recommendations for Other Services       Functional Status Assessment Patient has had a recent decline in their functional status and demonstrates the ability to make significant improvements in function in a reasonable and predictable amount of time.      Precautions / Restrictions Precautions Precautions: Fall Restrictions Weight Bearing Restrictions: No      Mobility  Bed Mobility Overal bed mobility: Needs Assistance Bed Mobility: Supine to Sit     Supine to sit: Mod assist     General bed mobility comments: increased time, labored movement with c/o severe pain left hip/groin area    Transfers Overall transfer level: Needs assistance Equipment used: Rolling walker (2 wheels) Transfers: Sit to/from Stand, Bed to chair/wheelchair/BSC Sit to Stand: Mod assist   Step pivot transfers: Mod assist       General transfer comment: unsteady labored movement    Ambulation/Gait Ambulation/Gait assistance: Mod assist Gait Distance (Feet): 4 Feet Assistive device: Rolling walker (2 wheels) Gait Pattern/deviations: Decreased step length - right, Decreased step length - left, Decreased stride length Gait velocity: slow     General Gait Details: limited to a few side steps before having to sit due to c/o severe left hip/groin pain  Stairs            Wheelchair Mobility     Tilt Bed    Modified Rankin (Stroke Patients Only)       Balance Overall balance assessment: Needs assistance Sitting-balance support: Feet supported, No upper extremity supported Sitting balance-Leahy Scale: Fair Sitting balance - Comments: fair/good seated at EOB   Standing balance support: Reliant on assistive device for balance, During functional activity, Bilateral upper extremity supported Standing balance-Leahy Scale: Poor Standing balance comment: fair/poor using RW  Pertinent Vitals/Pain Pain Assessment Pain Assessment: Faces Faces Pain Scale: Hurts whole lot Pain Location: left hip groin area with movement Pain Descriptors / Indicators: Grimacing, Guarding, Discomfort, Sharp Pain Intervention(s): Limited activity within patient's tolerance, Monitored during session, Premedicated  before session, Repositioned    Home Living Family/patient expects to be discharged to:: Private residence Living Arrangements: Spouse/significant other Available Help at Discharge: Family;Available 24 hours/day Type of Home: House Home Access: Stairs to enter Entrance Stairs-Rails: None Entrance Stairs-Number of Steps: 1   Home Layout: One level Home Equipment: Shower seat      Prior Function Prior Level of Function : Independent/Modified Independent;Driving             Mobility Comments: Tourist information centre manager, drives ADLs Comments: Independent     Extremity/Trunk Assessment   Upper Extremity Assessment Upper Extremity Assessment: Overall WFL for tasks assessed    Lower Extremity Assessment Lower Extremity Assessment: Generalized weakness;LLE deficits/detail LLE Deficits / Details: grossly 3+/5 LLE: Unable to fully assess due to pain LLE Sensation: WNL LLE Coordination: WNL    Cervical / Trunk Assessment Cervical / Trunk Assessment: Normal  Communication   Communication Communication: No apparent difficulties  Cognition Arousal: Alert Behavior During Therapy: WFL for tasks assessed/performed Overall Cognitive Status: Within Functional Limits for tasks assessed                                          General Comments      Exercises     Assessment/Plan    PT Assessment Patient needs continued PT services  PT Problem List Decreased strength;Decreased activity tolerance;Decreased balance;Decreased mobility       PT Treatment Interventions DME instruction;Gait training;Stair training;Functional mobility training;Therapeutic activities;Therapeutic exercise;Balance training;Patient/family education    PT Goals (Current goals can be found in the Care Plan section)  Acute Rehab PT Goals Patient Stated Goal: return home with family to assist PT Goal Formulation: With patient/family Time For Goal Achievement: 05/16/23 Potential to Achieve  Goals: Good    Frequency Min 3X/week     Co-evaluation               AM-PAC PT "6 Clicks" Mobility  Outcome Measure Help needed turning from your back to your side while in a flat bed without using bedrails?: A Lot Help needed moving from lying on your back to sitting on the side of a flat bed without using bedrails?: A Lot Help needed moving to and from a bed to a chair (including a wheelchair)?: A Lot Help needed standing up from a chair using your arms (e.g., wheelchair or bedside chair)?: A Lot Help needed to walk in hospital room?: A Lot Help needed climbing 3-5 steps with a railing? : Total 6 Click Score: 11    End of Session   Activity Tolerance: Patient tolerated treatment well;Patient limited by fatigue Patient left: in chair;with call bell/phone within reach;with family/visitor present Nurse Communication: Mobility status PT Visit Diagnosis: Unsteadiness on feet (R26.81);Other abnormalities of gait and mobility (R26.89);Muscle weakness (generalized) (M62.81)    Time: 1610-9604 PT Time Calculation (min) (ACUTE ONLY): 28 min   Charges:   PT Evaluation $PT Eval Moderate Complexity: 1 Mod PT Treatments $Therapeutic Activity: 23-37 mins PT General Charges $$ ACUTE PT VISIT: 1 Visit         3:51 PM, 05/12/23 Ocie Bob, MPT Physical Therapist with [facility] 336 [phone]  office [phone] mobile phone

## 2023-05-12 NOTE — TOC Initial Note (Signed)
Transition of Care [facility]) - Initial/Assessment Note    Patient Details  Name: [name] MRN: [id number] Date of Birth: 02/20/1951  Transition of Care [facility]) CM/SW Contact:    Elliot Gault, LCSW Phone Number: 05/12/2023, 3:51 PM  Clinical Narrative:                  Pt admitted from home. PT recommending HHPT and DME at dc.  Met with pt at bedside to assess. Per pt, she resides with her husband and her daughter and grandchildren live nearby. Pt is eager to return home at dc. She is agreeable to [facility] and DME being arranged by TOC. CMS provider options reviewed. Will refer to [employer] for DME and Frances Furbish accepted for [facility].  Anticipating dc tomorrow. TOC will follow.  Expected Discharge Plan: Home w Home Health Services Barriers to Discharge: Continued Medical Work up   Patient Goals and CMS Choice Patient states their goals for this hospitalization and ongoing recovery are:: go home CMS Medicare.gov Compare Post Acute Care list provided to:: Patient Choice offered to / list presented to : Patient      Expected Discharge Plan and Services In-house Referral: Clinical Social Work   Post Acute Care Choice: Home Health Living arrangements for the past 2 months: Single Family Home                           HH Arranged: PT, OT HH Agency: [facility] Date [facility] Agency Contacted: 05/12/23   Representative spoke with at [facility] Agency: Kandee Keen  Prior Living Arrangements/Services Living arrangements for the past 2 months: Single Family Home Lives with:: Spouse Patient language and need for interpreter reviewed:: Yes Do you feel safe going back to the place where you live?: Yes      Need for Family Participation in Patient Care: No (Comment) Care giver support system in place?: Yes (comment)   Criminal Activity/Legal Involvement Pertinent to Current Situation/Hospitalization: No - Comment as needed  Activities of Daily Living   ADL Screening (condition at time of  admission) Independently performs ADLs?: Yes (appropriate for developmental age) Is the patient deaf or have difficulty hearing?: No Does the patient have difficulty seeing, even when wearing glasses/contacts?: No Does the patient have difficulty concentrating, remembering, or making decisions?: No  Permission Sought/Granted Permission sought to share information with : Oceanographer granted to share information with : Yes, Verbal Permission Granted     Permission granted to share info w AGENCY: HH, DME        Emotional Assessment Appearance:: Appears younger than stated age Attitude/Demeanor/Rapport: Engaged Affect (typically observed): Pleasant Orientation: : Oriented to Self, Oriented to Place, Oriented to  Time, Oriented to Situation Alcohol / Substance Use: Not Applicable Psych Involvement: No (comment)  Admission diagnosis:  Fall [W19.XXXA] Closed fracture of other parts of pelvis, initial encounter [facility]) [S32.89XA] Patient Active Problem List   Diagnosis Date Noted   Fall 05/11/2023   Pelvic fracture (HCC) 05/11/2023   Bereavement 07/27/2022   Mild intermittent asthma without complication 09/28/2021   Idiopathic peripheral neuropathy 06/08/2021   Mouth sores 06/22/2020   Cough 06/05/2020   Bronchitis 06/05/2020   Sun-damaged skin 01/08/2020   Colon adenomas 12/30/2018   Gastritis and gastroduodenitis 10/21/2017   H/O adenomatous polyp of colon 10/21/2017   Right sided abdominal pain 10/21/2017   Change in bowel function 05/02/2017   Acid reflux 05/02/2017   Abdominal pain, epigastric 05/02/2017  Chronic idiopathic urticaria 04/17/2017   Depression, major, single episode, mild (HCC) 04/17/2017   Irritable bowel syndrome (IBS) 02/27/2017   Depression, major, single episode, moderate (HCC) 02/27/2017   Sleep apnea 02/27/2017   Elevated serum tryptase 05/14/2016   Allergic reactions 04/16/2016   Angioedema 04/16/2016   Perennial  allergic rhinitis 04/16/2016   Allergic conjunctivitis 04/16/2016   Fatigue 01/12/2016   SOB (shortness of breath) 01/12/2016   Chest tightness 01/12/2016   PAT (paroxysmal atrial tachycardia) (HCC) 01/12/2016   S/P placement of cardiac pacemaker 01/12/2016   Complete heart block (HCC) 09/18/2015   Constipation 07/14/2015   Urticaria/elevated serum tryptase 07/14/2015   Insomnia 04/10/2015   Depression with anxiety 04/10/2015   Osteoporosis 01/27/2014   Pain in joint, lower leg 08/30/2013   Migraines 01/21/2013   PCP:  Babs Sciara, MD Pharmacy:   [phone] - [place], [place] - [address] AT [facility] [address] [place] [address] Phone: [phone] Fax: [phone]  [facility] - [place], [place] - [address] [place] [address] Phone: [phone] Fax: [phone]  [facility] - [place], [place] - [address] [place] [address] Phone: [phone] Fax: [phone]     Social Determinants of Health (SDOH) Social History: SDOH Screenings   Food Insecurity: No Food Insecurity (05/11/2023)  Housing: Low Risk  (05/11/2023)  Transportation Needs: No Transportation Needs (05/11/2023)  Utilities: Not At Risk (05/11/2023)  Alcohol Screen: Low Risk  (09/20/2022)  Depression (PHQ2-9): Low Risk  (03/31/2023)  Financial Resource Strain: Low Risk  (09/11/2021)  Physical Activity: Sufficiently Active (09/20/2022)  Social Connections: Socially Integrated (09/20/2022)  Stress: No Stress Concern Present (09/20/2022)  Tobacco Use: Medium Risk (05/11/2023)   SDOH Interventions:     Readmission Risk Interventions     No data to display

## 2023-05-12 NOTE — Progress Notes (Signed)
   05/12/23 1000  Spiritual Encounters  Type of Visit Initial  Care provided to: Patient  Referral source Chaplain team  Reason for visit Routine spiritual support  OnCall Visit No   Chaplain met with the patient, [name] who was holding on to a positive attitude in spite of her injuries.  [name] shared that she is in a lot of pain and she understands that walking is the best way to heal from the injuries she sustained Time to heal is going to be critical and [name] shared that it is hard to be still which is what she is having to be right now. Family is going to play an important role in her recovery as she is not going to be able to do many things She hopes they will step up.   [name] [facility]  [phone]

## 2023-05-12 NOTE — Progress Notes (Signed)
  Progress Note   Patient: [name] ACZ:[id number] DOB: 10/15/1950 DOA: 05/11/2023     0 DOS: the patient was seen and examined on 05/12/2023   Brief hospital admission course: As per H&P written by Dr. Mariea Clonts on 05/11/2023 [name] is a 72 y.o. female with medical history significant for Complete heart block with pacemaker, IBS, Asthma, Migraine, paroxysmal atria tachycardia. Patient was brought to the ED via EMS for reports of a fall, getting Christmas decorations from the attic.Marland Kitchen  Her son-in-law was sliding down an item- ~ 45 pounds, she was 3-4 rungs up a ladder,with the sudden weight handed to her, she fell to the floor.  She did not hit her head she did not lose consciousness.  She denies chest pain no dizziness.  She has had good oral intake no vomiting no diarrhea.   ED Course: Heart rate 68-79.  Blood pressure systolic 93-132.  EKG shows first-degree AV block, LBBB. Pelvic x-ray with osseous irregularity involving the right parasymphyseal pubic bone. Cannot exclude fracture.  CT pelvis-acute comminuted minimally displaced right parasymphyseal fracture extending to right superior pubic rami.  Nondisplaced acute fracture to the lateral aspect of left sacral ala Hospitalist to admit for mechanical fall, pelvic fracture, unable to ambulate.  Assessment and Plan: * Fall -Mechanical fall with pelvic fracture, having significant difficulty with ambulation -Follow PT/OT evaluation and recommendation -Continue as needed analgesic -Continue supportive care.  History of complete heart block (HCC) -Status post pacemaker implantation -Continue patient follow-up with electrophysiology service.    Migraines -Continue treatment with Topamax -No complaining of headaches currently.  History of restless leg syndrome -Continue treatment with Requip  History of anxiety/depression -Stable mood appreciated -Continue treatment with bupropion.  Gastroesophageal flux disease/GI  protection -Continue PPI.   Subjective:  Afebrile, no chest pain, no palpitations, no nausea, no vomiting.  Still complaining of significant pain in her pelvis.  Physical Exam: Vitals:   05/11/23 2001 05/12/23 0012 05/12/23 0526 05/12/23 1605  BP: (!) 111/54 (!) 98/51 (!) 102/51 (!) 115/54  Pulse: 83 80 77 77  Resp: 16 17 18 18   Temp: 98.8 F (37.1 C) 98.8 F (37.1 C) 98.4 F (36.9 C) 98.7 F (37.1 C)  TempSrc: Oral Oral Oral Oral  SpO2: 99% 91% 95% 100%  Weight:      Height:       General exam: Alert, awake, oriented x 3; mild distress secondary to pain. Respiratory system: Clear to auscultation. Respiratory effort normal.  Good saturation on room air. Cardiovascular system:RRR. No rub or gallop; no JVD. Gastrointestinal system: Abdomen is nondistended, soft and nontender. No organomegaly or masses felt. Normal bowel sounds heard. Central nervous system:  No focal neurological deficits. Extremities: No cyanosis or clubbing; no edema appreciated on exam. Skin: No petechiae. Psychiatry: Judgement and insight appear normal. Mood & affect appropriate.   Data Reviewed: CBC: WBCs 11.6, hemoglobin 13.5 and platelet count 233K Basic metabolic panel: Sodium 141, potassium 4.4, chloride 105, bicarb 25, glucose 99, BUN 17, creatinine 0.85 and GFR >60   Family Communication: Husband at bedside.  Disposition: Status is: Inpatient Remains inpatient appropriate because: Continue supportive care and analgesic therapy.  Follow PT/OT recommendations.   Planned Discharge Destination: To be determined.   Time spent: 40 minutes  Author: Vassie Loll, MD 05/12/2023 7:15 PM  For on call review [email or web address].

## 2023-05-12 NOTE — Plan of Care (Signed)
  Problem: Acute Rehab PT Goals(only PT should resolve) Goal: Pt Will Go Supine/Side To Sit Outcome: Progressing Flowsheets (Taken 05/12/2023 1553) Pt will go Supine/Side to Sit: with minimal assist Goal: Patient Will Transfer Sit To/From Stand Outcome: Progressing Flowsheets (Taken 05/12/2023 1553) Patient will transfer sit to/from stand: with minimal assist Goal: Pt Will Transfer Bed To Chair/Chair To Bed Outcome: Progressing Flowsheets (Taken 05/12/2023 1553) Pt will Transfer Bed to Chair/Chair to Bed: with min assist Goal: Pt Will Ambulate Outcome: Progressing Flowsheets (Taken 05/12/2023 1553) Pt will Ambulate:  25 feet  with minimal assist  with moderate assist  with rolling walker   3:53 PM, 05/12/23 Ocie Bob, MPT Physical Therapist with [facility] 336 [phone] office [phone] mobile phone

## 2023-05-13 DIAGNOSIS — G43709 Chronic migraine without aura, not intractable, without status migrainosus: Secondary | ICD-10-CM | POA: Diagnosis not present

## 2023-05-13 DIAGNOSIS — W19XXXD Unspecified fall, subsequent encounter: Secondary | ICD-10-CM | POA: Diagnosis not present

## 2023-05-13 DIAGNOSIS — S3289XA Fracture of other parts of pelvis, initial encounter for closed fracture: Secondary | ICD-10-CM | POA: Diagnosis not present

## 2023-05-13 DIAGNOSIS — I442 Atrioventricular block, complete: Secondary | ICD-10-CM | POA: Diagnosis not present

## 2023-05-13 MED ORDER — TRAMADOL HCL 50 MG PO TABS: 50.0000 mg | ORAL_TABLET | Freq: Two times a day (BID) | ORAL | 0 refills | Status: DC | PRN

## 2023-05-13 MED ORDER — IBUPROFEN 400 MG PO TABS: 400.0000 mg | ORAL_TABLET | Freq: Three times a day (TID) | ORAL | Status: AC | PRN

## 2023-05-13 MED ORDER — PANTOPRAZOLE SODIUM 40 MG PO TBEC: 40.0000 mg | DELAYED_RELEASE_TABLET | Freq: Two times a day (BID) | ORAL | 1 refills | Status: DC

## 2023-05-13 NOTE — TOC Transition Note (Signed)
Transition of Care [facility]) - CM/SW Discharge Note   Patient Details  Name: [name] MRN: [id number] Date of Birth: 01-Feb-1951  Transition of Care [facility]) CM/SW Contact:  Karn Cassis, LCSW Phone Number: 05/13/2023, 1:47 PM   Clinical Narrative:  Pt d/c today. Cory with [facility] notified. Home health orders in. Pt states she now needs a wheelchair. Pt will get rolling walker on own and [employer] has delivered wheelchair and 3N1 to room. No other needs reported.      Final next level of care: Home w Home Health Services Barriers to Discharge: Barriers Resolved   Patient Goals and CMS Choice CMS Medicare.gov Compare Post Acute Care list provided to:: Patient Choice offered to / list presented to : Patient  Discharge Placement                         Discharge Plan and Services Additional resources added to the After Visit Summary for   In-house Referral: Clinical Social Work   Post Acute Care Choice: Home Health          DME Arranged: Wheelchair manual, 3-N-1 DME Agency: [employer] Date DME Agency Contacted: 05/13/23 Time DME Agency Contacted: [phone] Representative spoke with at DME Agency: Ian Malkin HH Arranged: PT, OT HH Agency: [facility] Date [facility] [place] [facility] Agency Contacted: 05/12/23   Representative spoke with at [facility] Agency: Kandee Keen  Social Determinants of Health (SDOH) Interventions SDOH Screenings   Food Insecurity: No Food Insecurity (05/11/2023)  Housing: Low Risk  (05/11/2023)  Transportation Needs: No Transportation Needs (05/11/2023)  Utilities: Not At Risk (05/11/2023)  Alcohol Screen: Low Risk  (09/20/2022)  Depression (PHQ2-9): Low Risk  (03/31/2023)  Financial Resource Strain: Low Risk  (09/11/2021)  Physical Activity: Sufficiently Active (09/20/2022)  Social Connections: Socially Integrated (09/20/2022)  Stress: No Stress Concern Present (09/20/2022)  Tobacco Use: Medium Risk (05/11/2023)     Readmission Risk Interventions     No data to  display

## 2023-05-13 NOTE — Discharge Summary (Signed)
Physician Discharge Summary   Patient: [name] MRN: [id number] DOB: February 14, 1951  Admit date:     05/11/2023  Discharge date: 05/13/23  Discharge Physician: Vassie Loll   PCP: Babs Sciara, MD   Recommendations at discharge:  Repeat basic metabolic panel to follow electrolytes and renal function Reassess patient pain and if needed prescribed further analgesics therapy.  Discharge Diagnoses: Principal Problem:   Fall Active Problems:   Pelvic fracture (HCC)   Migraines   Depression with anxiety   Complete heart block [facility])  Brief hospital admission course: As per H&P written by Dr. Mariea Clonts on 05/11/2023 [name] is a 72 y.o. female with medical history significant for Complete heart block with pacemaker, IBS, Asthma, Migraine, paroxysmal atria tachycardia. Patient was brought to the ED via EMS for reports of a fall, getting Christmas decorations from the attic.Marland Kitchen  Her son-in-law was sliding down an item- ~ 45 pounds, she was 3-4 rungs up a ladder,with the sudden weight handed to her, she fell to the floor.  She did not hit her head she did not lose consciousness.  She denies chest pain no dizziness.  She has had good oral intake no vomiting no diarrhea.   ED Course: Heart rate 68-79.  Blood pressure systolic 93-132.  EKG shows first-degree AV block, LBBB. Pelvic x-ray with osseous irregularity involving the right parasymphyseal pubic bone. Cannot exclude fracture.  CT pelvis-acute comminuted minimally displaced right parasymphyseal fracture extending to right superior pubic rami.  Nondisplaced acute fracture to the lateral aspect of left sacral ala Hospitalist to admit for mechanical fall, pelvic fracture, unable to ambulate.  Assessment and Plan: Mechanical fall -Mechanical fall with pelvic fracture, having significant difficulty with ambulation secondary to pain. -Evaluation by PT recommending home health services; rolling walker and the use of lightweight  wheelchair for long distances. -Continue as needed analgesic -Continue supportive care.   History of complete heart block (HCC) -Status post pacemaker implantation -Continue patient follow-up with electrophysiology service.     Migraines -Continue treatment with Topamax -No complaining of headaches currently.   History of restless leg syndrome -Continue treatment with Requip   History of anxiety/depression -Stable mood appreciated -Continue treatment with bupropion.   Gastroesophageal flux disease/GI protection -Continue PPI.  Consultants: None Procedures performed: See below for x-ray report. Disposition: Home Diet recommendation: Heart healthy diet.  DISCHARGE MEDICATION: Allergies as of 05/13/2023       Reactions   Beta Adrenergic Blockers Shortness Of Breath   Ambien [zolpidem Tartrate] Other (See Comments)   Hallucinations   Biaxin [clarithromycin] Hives   May have caused hives see 06/19/15   Cymbalta [duloxetine Hcl] Other (See Comments)   Vivid nightmares Grogginess   Fosamax [alendronate Sodium] Other (See Comments)   Gastritis         Medication List     TAKE these medications    buPROPion 150 MG 24 hr tablet Commonly known as: WELLBUTRIN XL TAKE 1 TABLET BY MOUTH EVERY DAY   Centrum Silver 50+Women Tabs Take 1 tablet by mouth daily.   cyanocobalamin 1000 MCG tablet Commonly known as: VITAMIN B12 Take 1,000 mcg by mouth See admin instructions. Take 1 tablet ( ) every other night.   GINGER PO Take 1,000 mg by mouth daily.   ibuprofen 400 MG tablet Commonly known as: ADVIL Take 1 tablet (400 mg total) by mouth every 8 (eight) hours as needed for headache, mild pain (pain score 1-3) or fever. Do not exceed more than 1200 mg  in 24 hours.   levocetirizine 5 MG tablet Commonly known as: XYZAL Take 5 mg by mouth every evening.   montelukast 10 MG tablet Commonly known as: SINGULAIR TAKE 1 TABLET BY MOUTH AT BEDTIME   pantoprazole 40  MG tablet Commonly known as: PROTONIX Take 1 tablet (40 mg total) by mouth 2 (two) times daily. TAKE 1 TABLET BY MOUTH EVERY DAY AS NEEDED FOR ACID REFLUX What changed: See the new instructions.   rizatriptan 10 MG tablet Commonly known as: MAXALT Take one at onset of migraine; may repeat in 2 hours if needed; max 2 per 24 hours   rOPINIRole 0.5 MG tablet Commonly known as: REQUIP TAKE 1 TABLET BY MOUTH AT BEDTIME. What changed:  how much to take how to take this when to take this additional instructions   rosuvastatin 10 MG tablet Commonly known as: Crestor Take 1 tablet (10 mg total) by mouth daily. For cholesterol   sertraline 50 MG tablet Commonly known as: ZOLOFT TAKE 1 TABLET BY MOUTH EVERY DAY What changed:  how much to take how to take this when to take this   topiramate 100 MG tablet Commonly known as: TOPAMAX Take 100 mg by mouth 2 (two) times daily.   traMADol 50 MG tablet Commonly known as: Ultram Take 1 tablet (50 mg total) by mouth every 12 (twelve) hours as needed for severe pain (pain score 7-10).   traZODone 50 MG tablet Commonly known as: DESYREL TAKE 1/2 TO 1 TABLET BY MOUTH AT BEDTIME AS NEEDED FOR SLEEP   vitamin C 1000 MG tablet Take 1,000 mg by mouth daily.               Durable Medical Equipment  (From admission, onward)           Start     Ordered   05/13/23 1214  For home use only DME lightweight manual wheelchair with seat cushion  Once       Comments: Patient suffers from pelvic fracture which impairs their ability to perform daily activities like walking long distances. in the home.  A walker will not resolve  issue with performing activities of daily living. A wheelchair will allow patient to safely perform daily activities. Patient is not able to propel themselves in the home using a standard weight wheelchair due to endurance. Patient can self propel in the lightweight wheelchair. Length of need 6 months . Accessories:  elevating leg rests (ELRs), wheel locks, extensions and anti-tippers.   05/13/23 1214   05/13/23 0801  For home use only DME 3 n 1  Once        05/13/23 0801   05/12/23 1555  For home use only DME Walker rolling  Once       Comments: Patient s/p right pubic rami fracture and unable to stand without AD, patient will benefit from use of RW  Question Answer Comment  Walker: With 5 Inch Wheels   Patient needs a walker to treat with the following condition Gait difficulty      05/12/23 1555            Follow-up Information     [facility], [facility] Follow up.   Specialty: Home Health Services Why: [facility] staff will call you to schedule in home therapy visits Contact information: [address] [place] [address] [phone]         Babs Sciara, MD. Schedule an appointment as soon as possible for  a visit in 2 week(s).   Specialty: Family Medicine Contact information: [address] [place] [address] [phone]                Discharge Exam: Ceasar Mons Weights   05/11/23 1224 05/11/23 2000  Weight: 78.9 kg 83.8 kg   General exam: Alert, awake, oriented x 3; mild distress secondary to pain when wearing weight on with certain movements..  Feels ready to go home. Respiratory system: Clear to auscultation. Respiratory effort normal.  Good saturation on room air. Cardiovascular system:RRR. No rub or gallop; no JVD. Gastrointestinal system: Abdomen is nondistended, soft and nontender. No organomegaly or masses felt. Normal bowel sounds heard. Central nervous system:  No focal neurological deficits. Extremities: No cyanosis or clubbing; no edema appreciated on exam. Skin: No petechiae. Psychiatry: Judgement and insight appear normal. Mood & affect appropriate.   Condition at discharge: Stable and improved.  The results of significant diagnostics from this hospitalization (including imaging, microbiology, ancillary and  laboratory) are listed below for reference.   Imaging Studies: CT PELVIS WO CONTRAST  Result Date: 05/11/2023 CLINICAL DATA:  Larey Seat, right inguinal and left hip pain, possible right parasymphyseal fracture on x-ray EXAM: CT PELVIS WITHOUT CONTRAST TECHNIQUE: Multidetector CT imaging of the pelvis was performed following the standard protocol without intravenous contrast. RADIATION DOSE REDUCTION: This exam was performed according to the departmental dose-optimization program which includes automated exposure control, adjustment of the mA and/or kV according to patient size and/or use of iterative reconstruction technique. COMPARISON:  11/20/2016, 05/11/2023 FINDINGS: Urinary Tract:  Distal ureters and bladder are unremarkable. Bowel: No bowel obstruction or ileus. No bowel wall thickening or inflammatory change. Vascular/Lymphatic: Atherosclerosis of the aorta and iliac vessels. No pathologic adenopathy. Reproductive:  Uterus and adnexal structures are unremarkable. Other: No free fluid or free intraperitoneal gas. No abdominal wall hernia. Musculoskeletal: There is a comminuted mildly displaced right parasymphyseal fracture extending into the right superior pubic ramus, corresponding to prior x-ray findings. Alignment is near anatomic. There is a nondisplaced fracture in the sagittal plane through the lateral margin of the left sacral ala. No other acute pelvic fractures. Symmetrical bilateral hip osteoarthritis. Severe spondylosis and facet hypertrophy at the L5-S1 level, with severe bilateral neural foraminal encroachment. Reconstructed images demonstrate no additional findings. IMPRESSION: 1. Acute comminuted minimally displaced right parasymphyseal fracture extending of the right superior pubic ramus, corresponding to x-ray findings. Alignment is near anatomic. 2. Nondisplaced acute fracture through the lateral aspect of the left sacral ala. 3. Severe spondylosis and facet hypertrophy at the L5-S1 level,  with severe bilateral neural foraminal encroachment. 4.  Aortic Atherosclerosis (ICD10-I70.0). Electronically Signed   By: Sharlet Salina M.D.   On: 05/11/2023 16:44   DG Hip Unilat W or Wo Pelvis 2-3 Views Left  Result Date: 05/11/2023 CLINICAL DATA:  Fall.  Right groin and left hip pain. EXAM: DG HIP (WITH OR WITHOUT PELVIS) 2-3V LEFT; LUMBAR SPINE - COMPLETE 4+ VIEW COMPARISON:  11/01/2016 abdominopelvic CT. FINDINGS: Lumbar spine: Cholecystectomy. Sacroiliac joints are symmetric. Mild limitations secondary to cross-table lateral technique. Grossly maintained vertebral body height. Intervertebral disc heights are maintained. No dedicated lumbosacral spine image submitted. The second oblique image is suboptimal secondary to positioning. Left hip: AP view of the pelvis and AP/cross-table lateral views of the left hip. No frog-leg view is submitted. Femoral heads are located. Sacroiliac joints are symmetric. Cross-table lateral view is suboptimal secondary to positioning and patient's size. Subtle osseous irregularity involving the parasymphyseal right pubic  bone. IMPRESSION: Mild technique limitations as detailed above. Osseous irregularity involving the right parasymphyseal pubic bone. Cannot exclude fracture. Correlate with point tenderness and consider CT. Otherwise, no acute finding about the lumbar spine or left hip. Electronically Signed   By: Jeronimo Greaves M.D.   On: 05/11/2023 14:29   DG Lumbar Spine Complete  Result Date: 05/11/2023 CLINICAL DATA:  Fall.  Right groin and left hip pain. EXAM: DG HIP (WITH OR WITHOUT PELVIS) 2-3V LEFT; LUMBAR SPINE - COMPLETE 4+ VIEW COMPARISON:  11/01/2016 abdominopelvic CT. FINDINGS: Lumbar spine: Cholecystectomy. Sacroiliac joints are symmetric. Mild limitations secondary to cross-table lateral technique. Grossly maintained vertebral body height. Intervertebral disc heights are maintained. No dedicated lumbosacral spine image submitted. The second oblique image is  suboptimal secondary to positioning. Left hip: AP view of the pelvis and AP/cross-table lateral views of the left hip. No frog-leg view is submitted. Femoral heads are located. Sacroiliac joints are symmetric. Cross-table lateral view is suboptimal secondary to positioning and patient's size. Subtle osseous irregularity involving the parasymphyseal right pubic bone. IMPRESSION: Mild technique limitations as detailed above. Osseous irregularity involving the right parasymphyseal pubic bone. Cannot exclude fracture. Correlate with point tenderness and consider CT. Otherwise, no acute finding about the lumbar spine or left hip. Electronically Signed   By: Jeronimo Greaves M.D.   On: 05/11/2023 14:29   DG Chest 1 View  Result Date: 05/11/2023 CLINICAL DATA:  Status post fall.  Right groin and left hip pain. EXAM: CHEST  1 VIEW COMPARISON:  Radiographs 06/05/2020 and 01/12/2016. FINDINGS: 1349 hours. Lordotic positioning. Left subclavian pacemaker leads appear unchanged, projecting over the right atrium and right ventricle. The heart size and mediastinal contours are stable. Stable mild biapical scarring. The lungs are otherwise clear. No evidence of pleural effusion or pneumothorax. No acute fractures are identified in the chest. IMPRESSION: No evidence of acute chest injury. Stable chest radiograph. Electronically Signed   By: Carey Bullocks M.D.   On: 05/11/2023 14:28    Microbiology: Results for orders placed or performed in visit on 06/05/20  Novel Coronavirus, NAA ([facility])     Status: None   Collection Time: 06/05/20 12:00 AM   Specimen: Nasopharyngeal(NP) swabs in vial transport medium   Nasopharynge  Result Value Ref Range Status   SARS-CoV-2, NAA Not Detected Not Detected Final    Comment: This nucleic acid amplification test was developed and its performance characteristics determined by [employer]. Nucleic acid amplification tests include RT-PCR and TMA. This test has not been FDA  cleared or approved. This test has been authorized by FDA under an Emergency Use Authorization (EUA). This test is only authorized for the duration of time the declaration that circumstances exist justifying the authorization of the emergency use of in vitro diagnostic tests for detection of SARS-CoV-2 virus and/or diagnosis of COVID-19 infection under section 564(b)(1) of the Act, 21 U.S.C. 086VHQ-4(O) (1), unless the authorization is terminated or revoked sooner. When diagnostic testing is negative, the possibility of a false negative result should be considered in the context of a patient's recent exposures and the presence of clinical signs and symptoms consistent with COVID-19. An individual without symptoms of COVID-19 and who is not shedding SARS-CoV-2 virus wo uld expect to have a negative (not detected) result in this assay.   SARS-COV-2, NAA 2 DAY TAT     Status: None   Collection Time: 06/05/20 12:00 AM   Nasopharynge  Result Value Ref Range Status   SARS-CoV-2, NAA 2 DAY TAT  Performed  Final    Labs: CBC: Recent Labs  Lab 05/11/23 1319  WBC 11.6*  NEUTROABS 8.1*  HGB 13.5  HCT 42.5  MCV 98.2  PLT 233   Basic Metabolic Panel: Recent Labs  Lab 05/11/23 1319  NA 141  K 4.4  CL 105  CO2 25  GLUCOSE 99  BUN 17  CREATININE 0.85  CALCIUM 9.3    Discharge time spent: greater than 30 minutes.  Signed: Vassie Loll, MD [facility] 05/13/2023

## 2023-05-13 NOTE — Progress Notes (Signed)
Physical Therapy Treatment Patient Details Name: [name] MRN: [id number] DOB: May 21, 1951 Today's Date: 05/13/2023   History of Present Illness [name] is a 72 y.o. female with medical history significant for Complete heart block with pacemaker, IBS, Asthma, Migraine, paroxysmal atria tachycardia.  Patient was brought to the ED via EMS for reports of a fall, getting Christmas decorations from the attic.Marland Kitchen  Her son-in-law was sliding down an item- ~ 45 pounds, she was 3-4 rungs up a ladder,with the sudden weight handed to her, she fell to the floor.  She did not hit her head she did not lose consciousness.  She denies chest pain no dizziness.  She has had good oral intake no vomiting no diarrhea.    PT Comments  Patient presents seat in chair (assisted by nursing staff) and agreeable for therapy.  Patient demonstrates fair/good return for completing exercises with mild increase in right hip/groin pain, increased endurance/distance for taking steps at bedside with c/o severe pain RLE when weightbearing, limited to taking steps at bedside and tolerated staying up in chair after therapy.  Patient will benefit from continued skilled physical therapy in hospital and recommended venue below to increase strength, balance, endurance for safe ADLs and gait.     If plan is discharge home, recommend the following: A lot of help with bathing/dressing/bathroom;A lot of help with walking and/or transfers;Help with stairs or ramp for entrance;Assistance with cooking/housework   Can travel by private vehicle        Equipment Recommendations  BSC/3in1;Rolling walker (2 wheels)    Recommendations for Other Services       Precautions / Restrictions Precautions Precautions: Fall Restrictions Weight Bearing Restrictions: No     Mobility  Bed Mobility               General bed mobility comments: Patient presents seated in chair (assisted by nursing staff)    Transfers Overall  transfer level: Needs assistance Equipment used: Rolling walker (2 wheels) Transfers: Sit to/from Stand Sit to Stand: Min assist, Mod assist           General transfer comment: increased time, labored movement    Ambulation/Gait Ambulation/Gait assistance: Mod assist Gait Distance (Feet): 12 Feet Assistive device: Rolling walker (2 wheels) Gait Pattern/deviations: Decreased step length - left, Decreased stance time - right, Decreased stride length, Antalgic Gait velocity: slow     General Gait Details: limited to taking steps at bedside with fair/good return for turning without loss of balance, but limited due to increasing right hip/groin pain   Stairs             Wheelchair Mobility     Tilt Bed    Modified Rankin (Stroke Patients Only)       Balance Overall balance assessment: Needs assistance Sitting-balance support: Feet supported, No upper extremity supported Sitting balance-Leahy Scale: Fair Sitting balance - Comments: fair/good seated in chair   Standing balance support: Reliant on assistive device for balance, No upper extremity supported, Bilateral upper extremity supported Standing balance-Leahy Scale: Poor Standing balance comment: fair/poor using RW                            Cognition   Behavior During Therapy: WFL for tasks assessed/performed Overall Cognitive Status: Within Functional Limits for tasks assessed  Exercises General Exercises - Lower Extremity Long Arc Quad: Seated, AROM, Strengthening, Both, 10 reps Hip Flexion/Marching: Seated, AROM, Strengthening, Both, 10 reps Toe Raises: Seated, AROM, Both, Strengthening, 10 reps Heel Raises: Seated, AROM, Strengthening, Both, 10 reps    General Comments        Pertinent Vitals/Pain Pain Assessment Pain Assessment: Faces Faces Pain Scale: Hurts even more Pain Location: left hip groin area with movement Pain  Descriptors / Indicators: Grimacing, Guarding, Discomfort, Sharp Pain Intervention(s): Limited activity within patient's tolerance, Monitored during session, Premedicated before session, Repositioned    Home Living                          Prior Function            PT Goals (current goals can now be found in the care plan section) Acute Rehab PT Goals Patient Stated Goal: return home with family to assist PT Goal Formulation: With patient/family Time For Goal Achievement: 05/16/23 Potential to Achieve Goals: Good Progress towards PT goals: Progressing toward goals    Frequency    Min 3X/week      PT Plan      Co-evaluation              AM-PAC PT "6 Clicks" Mobility   Outcome Measure  Help needed turning from your back to your side while in a flat bed without using bedrails?: A Lot Help needed moving from lying on your back to sitting on the side of a flat bed without using bedrails?: A Lot Help needed moving to and from a bed to a chair (including a wheelchair)?: A Lot Help needed standing up from a chair using your arms (e.g., wheelchair or bedside chair)?: A Little Help needed to walk in hospital room?: A Lot Help needed climbing 3-5 steps with a railing? : A Lot 6 Click Score: 13    End of Session   Activity Tolerance: Patient tolerated treatment well;Patient limited by fatigue Patient left: in chair;with call bell/phone within reach Nurse Communication: Mobility status PT Visit Diagnosis: Unsteadiness on feet (R26.81);Other abnormalities of gait and mobility (R26.89);Muscle weakness (generalized) (M62.81)     Time: 4098-1191 PT Time Calculation (min) (ACUTE ONLY): 30 min  Charges:    $Therapeutic Exercise: 8-22 mins $Therapeutic Activity: 8-22 mins PT General Charges $$ ACUTE PT VISIT: 1 Visit                     4:03 PM, 05/13/23 Ocie Bob, MPT Physical Therapist with [facility] 336 [phone] office [phone]  mobile phone

## 2023-05-13 NOTE — Progress Notes (Signed)
This nurse and Raynelle Fanning, NT helped patient from bed to chair. Pt. Tolerated well. Morphine given before ambulating to help with pain management.

## 2023-05-13 NOTE — TOC Progression Note (Signed)
Transition of Care [facility]) - Progression Note    Patient Details  Name: [name] MRN: [id number] Date of Birth: 1951-05-04  Transition of [facility] CM/SW Contact  Karn Cassis, [place] Phone Number: 05/13/2023, 8:03 AM  Clinical Narrative: TOC notified Zach with [employer] of need for 3N1 and RW. Orders in.       Expected Discharge Plan: Home w Home Health Services Barriers to Discharge: Continued Medical Work up  Expected Discharge Plan and Services In-house Referral: Clinical Social Work   Post Acute Care Choice: Home Health Living arrangements for the past 2 months: Single Family Home                           HH Arranged: PT, OT HH Agency: [facility] Date [facility] Agency Contacted: 05/12/23   Representative spoke with at [facility] Agency: Kandee Keen   Social Determinants of Health (SDOH) Interventions SDOH Screenings   Food Insecurity: No Food Insecurity (05/11/2023)  Housing: Low Risk  (05/11/2023)  Transportation Needs: No Transportation Needs (05/11/2023)  Utilities: Not At Risk (05/11/2023)  Alcohol Screen: Low Risk  (09/20/2022)  Depression (PHQ2-9): Low Risk  (03/31/2023)  Financial Resource Strain: Low Risk  (09/11/2021)  Physical Activity: Sufficiently Active (09/20/2022)  Social Connections: Socially Integrated (09/20/2022)  Stress: No Stress Concern Present (09/20/2022)  Tobacco Use: Medium Risk (05/11/2023)    Readmission Risk Interventions     No data to display

## 2023-05-13 NOTE — Progress Notes (Signed)
Discharge instruction and home equipment reviewed with patient and patient's husband.  Both verbalized and demonstrated understanding of discharge instructions.  Patient discharged home with husband in stable condition.

## 2023-05-13 NOTE — Progress Notes (Signed)
The beneficiary is confined to a single room and will need a 3N1.

## 2023-05-13 NOTE — Progress Notes (Signed)
Patient suffers from pelvic fracture which impairs their ability to perform daily activities like ambulating in the home. A walker will not resolve issue with performing activities of daily living. A wheelchair will allow patient to safely perform daily activities. Patient can safely propel the wheelchair in the home or has a caregiver who can provide assistance.

## 2023-05-13 NOTE — Plan of Care (Signed)

## 2023-05-14 ENCOUNTER — Telehealth: Payer: Self-pay
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
## 2023-05-14 NOTE — Telephone Encounter (Signed)
1.  Please go ahead with referral for durable medical equipment for a hospital bed Utilize social worker if necessary to help get this Also we typically in the situations recommend home health consultation if she does not already have a long talk with patient and go ahead with home health consultation  Finally make sure that the patient has some sort of follow-up with [place] or orthopedics Initial delay it can be virtual until she is able to do follow-up in person

## 2023-05-15 ENCOUNTER — Telehealth: Payer: Self-pay

## 2023-05-15 ENCOUNTER — Ambulatory Visit: Payer: Self-pay | Admitting: *Deleted

## 2023-05-15 ENCOUNTER — Other Ambulatory Visit: Payer: Self-pay

## 2023-05-15 NOTE — Patient Instructions (Addendum)
Visit Information  Thank you for taking time to visit with me today. Please don't hesitate to contact me if I can be of assistance to you.   Mrs Chicas, you will need to follow up as her after summary visit (discharge sheets) advises whether it is face to face, virtual or telephonic with your pcp or other advised MD. This may be required prior to receiving your order for a hospital bed depending on the MD and/or [employer] the equipment company if the bed is to be covered by your insurance carrier.   Following are the goals we discussed today:   Goals Addressed             This Visit's Progress    Home safety after hospital discharge, obtain a hospital bed - RN CM care coordination       Interventions Today    Flowsheet Row Most Recent Value  Chronic Disease   Chronic disease during today's visit Other  [Need for hospital bed after hospital discharge]  General Interventions   General Interventions Discussed/Reviewed General Interventions Discussed, Durable Medical Equipment (DME), Doctor Visits, Communication with, Referral to Nurse, [facility] Resources  Doctor Visits Discussed/Reviewed Doctor Visits Discussed, PCP  Durable Medical Equipment (DME) Other  [hospital bed]  PCP/Specialist Visits Compliance with follow-up visit  [Patient will need # [phone] to follow up with [employer] related to date/time of hospital bed delivery to the home. The area for the bed needs to be cleared to receive the hospital bed prior to delivery]  Communication with PCP/Specialists, RN  [returned secure chat responses to [employer] RN & pcp Luking. Patient was ordered home health and equipment prior to hospital from [employer] that was delivered prior to discharge. Patient will need order/F2F from pcp & faxed to [employer] for hospital bed.]  Safety Interventions   Safety Discussed/Reviewed Safety Reviewed, Home Safety  Home Safety Assistive Devices, Refer for community resources              Our next  appointment is by telephone on 05/19/23 at 1030  Please call the care guide team at [phone] if you need to cancel or reschedule your appointment.   If you are experiencing a Mental Health or Behavioral Health Crisis or need someone to talk to, please call the Suicide and Crisis Lifeline: 988 call the [place] National Suicide Prevention Lifeline: (281) 554-3369 or TTY: (531) 300-8944 TTY 806-160-0113) to talk to a trained counselor call 1-800-273-TALK (toll free, 24 hour hotline) call the [facility]: 229-383-9582 call 911   Patient verbalizes understanding of instructions and care plan provided today and agrees to view in MyChart. Active MyChart status and patient understanding of how to access instructions and care plan via MyChart confirmed with patient.     The patient has been provided with contact information for the care management team and has been advised to call with any health related questions or concerns.   Saber Dickerman L. Noelle Penner, RN, BSN, [facility]  VBCI Care Management Coordinator  [phone]  Fax: [phone]

## 2023-05-15 NOTE — Telephone Encounter (Signed)
she appears to be already connected with [facility] for home health and [employer] for a wheelchair and 3N1 that was brought to her room prior to discharge Please give the patient [employer] number [phone] so she can call them for a possible time for delivery

## 2023-05-15 NOTE — Patient Outreach (Addendum)
  Care Coordination   Collaboration with pcp office - DME  Visit Note   05/15/2023 Name: [name] MRN: [id number] DOB: 10/14/1950  [name] is a 72 y.o. year old female who sees Luking, Jonna Coup, MD for primary care. I  received a secure chat from [employer] about post hospital equipment need  What matters to the patients health and wellness today?  Hospital bed  Discharged on 05/13/23 after a fall resulting in closed fracture of pelvis  Hospital bed- patient received home health orders for [facility] and equipment from [employer] prior to her hospital discharge. Now that she is out of the hospital, the primary care provider (PCP) or any other MD she is to follow up with would provide orders.   Patient will need to follow up as her after summary visit (discharge sheets) advises whether it is face to face, virtual or telephonic   Return message to RN & pcp requested order for hospital bed / Face to face be sent to [employer].  Patient will need [employer]'s number [phone] to follow up with [employer] for the hospital bed delivery date/time. The area that the bed will be located will need to be cleared prior to the delivery of the bed    Goals Addressed             This Visit's Progress    Home safety after hospital discharge, obtain a hospital bed - RN CM care coordination       Interventions Today    Flowsheet Row Most Recent Value  Chronic Disease   Chronic disease during today's visit Other  [Need for hospital bed after hospital discharge]  General Interventions   General Interventions Discussed/Reviewed General Interventions Discussed, Durable Medical Equipment (DME), Doctor Visits, Communication with, Referral to Nurse, [facility] Resources  Doctor Visits Discussed/Reviewed Doctor Visits Discussed, PCP  Durable Medical Equipment (DME) Other  [hospital bed]  PCP/Specialist Visits Compliance with follow-up visit  [Patient will need # [phone] to follow up  with [employer] related to date/time of hospital bed delivery to the home. The area for the bed needs to be cleared to receive the hospital bed prior to delivery]  Communication with PCP/Specialists, RN  [returned secure chat responses to [employer] RN & pcp Luking. Patient was ordered home health and equipment prior to hospital from [employer] that was delivered prior to discharge. Patient will need order/F2F from pcp & faxed to [employer] for hospital bed.]  Safety Interventions   Safety Discussed/Reviewed Safety Reviewed, Home Safety  Home Safety Assistive Devices, Refer for community resources              SDOH assessments and interventions completed:  No     Care Coordination Interventions:  Yes, provided   Follow up plan: Follow up call scheduled for 05/19/23    Encounter Outcome:  Patient Visit Completed   Cala Bradford L. Noelle Penner, RN, BSN, [facility]  VBCI Care Management Coordinator  [phone]  Fax: [phone]

## 2023-05-15 NOTE — Telephone Encounter (Signed)
Per Ms [name] -- Dr Gerda Diss [name] can get a hospital bed but will need an order & a face to face form (if you have seen her recently) faxed to [employer], she will need an appt please schedule

## 2023-05-16 ENCOUNTER — Telehealth: Payer: Self-pay | Admitting: Family Medicine

## 2023-05-16 ENCOUNTER — Telehealth (INDEPENDENT_AMBULATORY_CARE_PROVIDER_SITE_OTHER): Payer: Medicare Other | Admitting: Physician Assistant

## 2023-05-16 ENCOUNTER — Encounter: Payer: Self-pay | Admitting: Physician Assistant

## 2023-05-16 VITALS — Ht 64.0 in | Wt 174.0 lb

## 2023-05-16 DIAGNOSIS — G43909 Migraine, unspecified, not intractable, without status migrainosus: Secondary | ICD-10-CM | POA: Diagnosis not present

## 2023-05-16 DIAGNOSIS — I442 Atrioventricular block, complete: Secondary | ICD-10-CM | POA: Diagnosis not present

## 2023-05-16 DIAGNOSIS — S3289XD Fracture of other parts of pelvis, subsequent encounter for fracture with routine healing: Secondary | ICD-10-CM | POA: Diagnosis not present

## 2023-05-16 DIAGNOSIS — I4891 Unspecified atrial fibrillation: Secondary | ICD-10-CM | POA: Diagnosis not present

## 2023-05-16 DIAGNOSIS — S32110D Nondisplaced Zone I fracture of sacrum, subsequent encounter for fracture with routine healing: Secondary | ICD-10-CM | POA: Diagnosis not present

## 2023-05-16 DIAGNOSIS — S32591D Other specified fracture of right pubis, subsequent encounter for fracture with routine healing: Secondary | ICD-10-CM | POA: Diagnosis not present

## 2023-05-16 NOTE — Telephone Encounter (Signed)
Nurses According to Child psychotherapist we had to do a face-to-face evaluation in order to give documentation that is necessary by Medicare and insurance companies to have home health and the supplier get her hospital bed.  Please explain all this to the patient and do the following  Please arrange for virtual visit today with myself or with Toni Amend Grooms PA   It would be hard to know how quickly hospital bed can be given because of this is a insurance related issue it is a shame that the discharge planning people did not coordinate all this before she went home

## 2023-05-16 NOTE — Progress Notes (Signed)
Virtual Visit via Video Note  I connected with [name] on 05/16/23 at  3:10 PM EST by a video enabled telemedicine application and verified that I am speaking with the correct person using two identifiers.  Location: Patient: home Provider: office in clinic   I discussed the limitations of evaluation and management by telemedicine and the availability of in person appointments. The patient expressed understanding and agreed to proceed.  History of Present Illness:  Patient presents today via video visit for follow-up after hospital discharge due to pelvis and sacrum fracture.  She states she was seen and evaluated today by physical therapy.  At this visit they determined that it was best for patient to continue resting and sleeping in her recliner to keep her from rolling side-to-side as she has a fracture on left and right.  She requires assistance for her ADLs. At this time patient states she no longer requires hospital bed.   As part of this visit, I reviewed recent lab work and imaging from her recent hospitalization.     Observations/Objective: Patient appears well.  She is homebound at this time. Patient is alert and cooperative No acute distress, sitting comfortably   EOM intact, neck ROM normal No audible wheezing, stridor, cough     Assessment and Plan: Patient states pain is under control with prescribed tramadol and Advil.  She is to continue to to work with physical therapy.  She is encouraged to use gait belt for assistance at home.   Follow Up Instructions: Patient has scheduled follow-up visit with Dr. Gerda Diss on 06/03/23.  She is to follow-up if pain becomes uncontrollable or if she has other worsening concerns.    I discussed the assessment and treatment plan with the patient. The patient was provided an opportunity to ask questions and all were answered. The patient agreed with the plan and demonstrated an understanding of the instructions.   The patient  was advised to call back or seek an in-person evaluation if the symptoms worsen or if the condition fails to improve as anticipated.  I provided 15 minutes of non-face-to-face time during this encounter.   Toni Amend Lizeth Bencosme, PA-C

## 2023-05-16 NOTE — Telephone Encounter (Signed)
See follow up message from Dr Lorin Picket

## 2023-05-16 NOTE — Telephone Encounter (Signed)
See other message and message from Dr Lorin Picket

## 2023-05-16 NOTE — Telephone Encounter (Signed)
noted 

## 2023-05-16 NOTE — Telephone Encounter (Signed)
Patient scheduled 05/16/23 at 3:10pm with Toni Amend PA

## 2023-05-16 NOTE — Telephone Encounter (Signed)
Left message to return call 

## 2023-05-19 ENCOUNTER — Ambulatory Visit: Payer: Self-pay | Admitting: *Deleted

## 2023-05-19 DIAGNOSIS — I442 Atrioventricular block, complete: Secondary | ICD-10-CM | POA: Diagnosis not present

## 2023-05-19 DIAGNOSIS — G43909 Migraine, unspecified, not intractable, without status migrainosus: Secondary | ICD-10-CM | POA: Diagnosis not present

## 2023-05-19 DIAGNOSIS — S32110D Nondisplaced Zone I fracture of sacrum, subsequent encounter for fracture with routine healing: Secondary | ICD-10-CM | POA: Diagnosis not present

## 2023-05-19 DIAGNOSIS — S32591D Other specified fracture of right pubis, subsequent encounter for fracture with routine healing: Secondary | ICD-10-CM | POA: Diagnosis not present

## 2023-05-19 DIAGNOSIS — I4891 Unspecified atrial fibrillation: Secondary | ICD-10-CM | POA: Diagnosis not present

## 2023-05-19 NOTE — Patient Outreach (Signed)
  Care Coordination   05/19/2023 Name: [name] MRN: [id number] DOB: [birth year]   Care Coordination Outreach Attempts:  An unsuccessful telephone outreach was attempted today to offer the patient information about available care coordination services.  Follow Up Plan:  Additional outreach attempts will be made to offer the patient care coordination information and services.   Encounter Outcome:  No Answer   Care Coordination Interventions:  No, not indicated     Tanji Storrs L. Noelle Penner, RN, BSN, [facility]  VBCI Care Management Coordinator  [phone]  Fax: [phone]

## 2023-05-20 ENCOUNTER — Ambulatory Visit: Payer: Self-pay | Admitting: Family Medicine

## 2023-05-20 NOTE — Telephone Encounter (Signed)
Copied from CRM [id number]. Topic: Clinical - Red Word Triage >> May 20, 2023  1:21 PM Prudencio Pair wrote: Red Word that prompted transfer to Nurse Triage: Patient states she is in pain from a fracture that she has. Has already seen provider previously for this and left a message yesterday but haven't heard back. Patient states she has no pain medication left and is only taking ibuprofen for pain as of now.   Chief Complaint: Inadequately managed pain for fracture Symptoms: Pain to left side of sacrum and right side pelvis Frequency: Continual, intermittent at times Pertinent Negatives: Patient denies SOB, chest pain, other symptoms Disposition: [] ED /[] Urgent Care (no appt availability in office) / [x] Appointment(In office/virtual)/ []  [place] [facility]/ [] Home Care/ [] Refused Recommended Disposition /[] [place] [facility]/ [x]  Follow-up with PCP Additional Notes: Pt reporting that she fell from the attic 9 days ago trying to get a bin of Christmas decorations down, fell back, went to hospital and dx'd with fracture to left side of her sacrum and right side of her pelvis. Pt reporting that the hospital doc prescribed tramadol which pt says has "not been doing a lot of good, better than nothing," but pt has 1 pill left, [facility] speak with the ED doc so pt feels she will not receive that refill. Pt reporting that she was feeling "constant but tolerable" pain in her sacrum then "starting Saturday, intolerable, 8/10 pain, could not get relief," pt been doing her position changes, continuing to move and walk, using ice, all as instructed by ED. Pt reporting that she's been having pain on right side pelvis that would only "hurt when move, none at rest, now won't go away." Pt reporting that she has been doing PT since but her PT has recommended that they "stop until pain managed," since they suspect "inflammation around nerves, sciatica, may need  different pain med or cortisone, need to contact PCP and let know what's happening." Pt reporting that the stretching of her hamstring, "knee squeezes," and ice have been helping with the pain, currently feeling pain "relieved for the first time" since stretching but will be a different story "when I get up." Pt been putting ice on "all the time." Advised that pt be seen earlier for hospital follow-up visit with Dr. Gerda Diss, scheduled for 12/19, placed pt on wait list, requesting that pt be seen sooner if possible - please advise. Informed pt that nurse would also request for script to be sent for pain meds if appropriate - please advise. Advised pt call hospital to request nurse[facility] if able, in meantime. Pt verbalized understanding and will continue to follow ED instructions for care, will do ice 20 min, 20 min off with towel as barrier between skin and ice as nurse advised. Pt confirms no severe pain at this time.  Reason for Disposition  [1] MODERATE pain (e.g., interferes with normal activities) AND [2] high-risk adult (e.g., age > 60 years, osteoporosis, chronic steroid use)  Answer Assessment - Initial Assessment Questions 1. MECHANISM: "How did the injury happen?"       Pt reporting that she fell from the attic 9 days ago trying to get a bin of Christmas decorations down, fell back, went to hospital and dx'd with fracture to left side of her sacrum and right side of her pelvis. 2. ONSET: "When did the injury happen?" (Minutes or hours ago)  9 days ago 3. LOCATION: "Where is the injury located?"      Pelvis/sacrum 4. SEVERITY: "Can you sit?" "Can you walk?"      Pt reporting she is able to sit and walk but experiences pain. 5. PAIN: "Is there pain?" If Yes, ask: "How bad is the pain?" (e.g., Scale 1-10; mild, moderate, or severe)   - MILD (1-3): doesn't interfere with normal activities    - MODERATE (4-7): interferes with normal activities or awakens from sleep     - SEVERE (8-10): excruciating pain, unable to do any normal activities      Pt reporting pain varies, but been having "really serious pain on right side of pelvis" when had just been experiencing it with movement, pain won't go away, but currently feeling some relief after doing PT stretches. Pt reporting that her sacrum pain had been "intolerable" at 8/10 pain starting Saturday. Pt reporting only some relief with tramadol. 7. OTHER SYMPTOMS: "Do you have any other symptoms?" (e.g., numbness, back pain)     Pt confirms no other symptoms, denies SOB and chest pain.  Protocols used: Tailbone Injury-A-AH

## 2023-05-20 NOTE — Telephone Encounter (Signed)
Copied from CRM [id number]. Topic: General - Other >> May 20, 2023  1:26 PM Prudencio Pair wrote: Reason for CRM: Patient called back stating she received a message on her phone & via MyChart. Pt states she left a message yesterday in regards to her fracture. She wanted to speak with nurse or provider in regards to the televisit she had. Can someone please contact pt in regards to her previous visit. CB #: [id number].

## 2023-05-21 ENCOUNTER — Other Ambulatory Visit: Payer: Self-pay | Admitting: Family Medicine

## 2023-05-21 MED ORDER — TRAMADOL HCL 50 MG PO TABS: 50.0000 mg | ORAL_TABLET | Freq: Two times a day (BID) | ORAL | 0 refills | Status: DC | PRN

## 2023-05-21 NOTE — Telephone Encounter (Signed)
Copied from CRM [id number]. Topic: Clinical - Medication Refill >> May 21, 2023  2:50 PM Maxwell Marion wrote: Most Recent Primary Care Visit:  Provider: Olga Millers  Department: [facility]  Visit Type: MYCHART VIDEO VISIT  Date: 05/16/2023  Medication: ***  Has the patient contacted their pharmacy?  (Agent: If no, request that the patient contact the pharmacy for the refill. If patient does not wish to contact the pharmacy document the reason why and proceed with request.) (Agent: If yes, when and what did the pharmacy advise?)  Is this the correct pharmacy for this prescription?  If no, delete pharmacy and type the correct one.  This is the patient's preferred pharmacy:  [id number] [place], [place] - [address] [place] [address] Phone: [phone] Fax: [phone]  [facility] - [place], [place] - [address] [place] [address] Phone: [phone] Fax: [phone]  [facility] - [place], [place] - [address] [place] [address] Phone: [phone] Fax: [phone]   Has the prescription been filled recently?   Is the patient out of the medication?   Has the patient been seen for an appointment in the last year OR does the patient have an upcoming appointment?   Can we respond through MyChart?   Agent: Please be advised that Rx refills may take up to 3 business days. We ask that you follow-up with your pharmacy.

## 2023-05-22 ENCOUNTER — Ambulatory Visit (INDEPENDENT_AMBULATORY_CARE_PROVIDER_SITE_OTHER): Payer: Medicare Other | Admitting: Family Medicine

## 2023-05-22 VITALS — BP 106/78 | HR 77 | Temp 97.7°F | Ht 64.0 in | Wt 174.0 lb

## 2023-05-22 DIAGNOSIS — S3289XD Fracture of other parts of pelvis, subsequent encounter for fracture with routine healing: Secondary | ICD-10-CM

## 2023-05-22 DIAGNOSIS — Z780 Asymptomatic menopausal state: Secondary | ICD-10-CM

## 2023-05-22 DIAGNOSIS — R52 Pain, unspecified: Secondary | ICD-10-CM

## 2023-05-22 DIAGNOSIS — S32110D Nondisplaced Zone I fracture of sacrum, subsequent encounter for fracture with routine healing: Secondary | ICD-10-CM | POA: Diagnosis not present

## 2023-05-22 DIAGNOSIS — I442 Atrioventricular block, complete: Secondary | ICD-10-CM | POA: Diagnosis not present

## 2023-05-22 DIAGNOSIS — M810 Age-related osteoporosis without current pathological fracture: Secondary | ICD-10-CM

## 2023-05-22 DIAGNOSIS — G43909 Migraine, unspecified, not intractable, without status migrainosus: Secondary | ICD-10-CM | POA: Diagnosis not present

## 2023-05-22 DIAGNOSIS — S32591D Other specified fracture of right pubis, subsequent encounter for fracture with routine healing: Secondary | ICD-10-CM | POA: Diagnosis not present

## 2023-05-22 DIAGNOSIS — I4891 Unspecified atrial fibrillation: Secondary | ICD-10-CM | POA: Diagnosis not present

## 2023-05-22 MED ORDER — OXYCODONE-ACETAMINOPHEN 5-325 MG PO TABS: ORAL_TABLET | ORAL | 0 refills | Status: DC

## 2023-05-22 NOTE — Progress Notes (Signed)
Subjective:    Patient ID: [name], female    DOB: 1950-10-25, 72 y.o.   MRN: [id number]  Discussed the use of AI scribe software for clinical note transcription with the patient, who gave verbal consent to proceed.  History of Present Illness   The patient, with a history of osteoporosis, presented with severe pain following a fall that resulted in a sacral and pelvic fracture. The pain, initially constant and rated at 5/10, was localized to the site of the sacral fracture. However, the patient reported that the most severe pain was experienced in the pelvic region, which was exacerbated by movement. Over time, the patient noted an escalation in the sacral pain, describing it as 'unbearable.' This pain was not adequately managed with tramadol, and the patient reported having to resort to Advil for relief. The patient also reported that the pain radiated down the leg, suggesting possible nerve involvement.  The patient has been active despite the pain, using a walker for mobility. However, the pain intensifies towards the end of the day, significantly impacting the patient's sleep. The patient described an episode of severe pain that lasted until 3 am, during which she had to repeatedly stand and sit using her walker for relief.  The patient has a history of neuropathy in the feet, which she reported is worsening. She also has a history of a leg fracture in 2015, for which she was prescribed Percocet. The patient has been on Reclast IV for osteoporosis but had to stop after five years due to the recommended 'drug holiday.'  The patient expressed dissatisfaction with the hospital treatment following the fall, particularly the lack of attention to the sacral fracture. She was discharged after one and a half days without a walker or wheelchair, which she had to request. The patient has been receiving physical therapy at home, but the regimen was reduced due to the severity of the sacral pain.  The patient has been advised to stretch her hamstrings and entire body gently.  The patient has previously seen an orthopedist for a foot fracture and would like to follow up with the same doctor for the current injuries. She also expressed interest in considering gabapentin for the worsening neuropathy in the future.     X-ray CAT scan reviewed Lab work reviewed     Review of Systems     Objective:    Physical Exam   VITALS: Blood pressure on the lower end of normal. CARDIOVASCULAR: Normal heart sounds on auscultation.     General-in no acute distress Eyes-no discharge Lungs-respiratory rate normal, CTA CV-no murmurs,RRR Extremities skin warm dry no edema Neuro grossly normal Behavior normal, alert       Assessment & Plan:  Assessment and Plan    Sacral Fracture Severe pain, exacerbated by movement, not adequately controlled with Tramadol. Pain radiates down the leg, suggesting possible nerve involvement. -Discontinue Tramadol. -Start Oxycodone 1 tablet every 4 hours as needed for severe pain, with caution for potential drowsiness. -Consider future treatment with Gabapentin or Lyrica for nerve pain once acute pain is better controlled. -Refer to Orthopedics for further evaluation and management.  Pelvic Fracture Painful, but tolerable when not moving. -Continue current pain management plan.  Osteoporosis History of treatment with Reclast IV, currently on a drug holiday. -Plan to repeat bone density scan in early February 2025 to assess current status. -Consider future treatment with Prolia if osteoporosis has worsened.  Constipation Risk due to opioid use. Currently using MiraLAX every other  day. -Increase MiraLAX to daily use to prevent constipation.  General Health Maintenance / Followup Plans -Schedule follow-up appointment in 6-8 weeks to reassess sacral fracture. -Encourage patient to send a MyChart message by Monday morning with an update on pain  management. -Advise patient to contact the office if needing a renewal of the Oxycodone prescription.      1. Closed fracture of other part of pelvis with routine healing, subsequent encounter (Primary) Referral for orthopedic doctor Landau for further management - AMB referral to orthopedics  2. Post-menopausal Bone density in the near future history of osteoporosis has already been treated with 5 years of IV Reclast may need Prolia - DG Bone Density  3. Pain management Long discussion held regarding pain management of a fracture.  She is in severe pain tramadol not helping We did discuss oxycodone we also discussed how it can be habit-forming and addicting and it is important to gradually try to taper away from pain medicine but for now pain medicine seems very reasonable patient agrees to start off with a half a tablet every 4 hours as needed for severe pain and gradually increase as necessary maximum 2 tablets every 4 hours patient to give [place] feedback by early next week how she is doing we also discussed that this medication can cause drowsiness and increased risk of accidental falls Clearly currently we are not planning on prescribing this long-term more so short-term while she is getting over the sacral fracture  4. Osteoporosis, unspecified osteoporosis type, unspecified pathological fracture presence She would be a good candidate for Prolia but have to wait until the next bone density test  Patient to follow-up within 6 to 8 weeks give [place] feedback within the next 7 to 14 days sooner if any problems

## 2023-05-23 DIAGNOSIS — S329XXA Fracture of unspecified parts of lumbosacral spine and pelvis, initial encounter for closed fracture: Secondary | ICD-10-CM | POA: Diagnosis not present

## 2023-05-26 ENCOUNTER — Encounter: Payer: Self-pay | Admitting: Family Medicine

## 2023-05-26 ENCOUNTER — Other Ambulatory Visit: Payer: Self-pay | Admitting: Family Medicine

## 2023-05-26 DIAGNOSIS — S32110D Nondisplaced Zone I fracture of sacrum, subsequent encounter for fracture with routine healing: Secondary | ICD-10-CM | POA: Diagnosis not present

## 2023-05-26 DIAGNOSIS — I442 Atrioventricular block, complete: Secondary | ICD-10-CM | POA: Diagnosis not present

## 2023-05-26 DIAGNOSIS — I4891 Unspecified atrial fibrillation: Secondary | ICD-10-CM | POA: Diagnosis not present

## 2023-05-26 DIAGNOSIS — G43909 Migraine, unspecified, not intractable, without status migrainosus: Secondary | ICD-10-CM | POA: Diagnosis not present

## 2023-05-26 DIAGNOSIS — S32591D Other specified fracture of right pubis, subsequent encounter for fracture with routine healing: Secondary | ICD-10-CM | POA: Diagnosis not present

## 2023-05-26 MED ORDER — OXYCODONE-ACETAMINOPHEN 5-325 MG PO TABS: ORAL_TABLET | ORAL | 0 refills | Status: DC

## 2023-05-30 ENCOUNTER — Telehealth: Payer: Self-pay | Admitting: *Deleted

## 2023-05-30 NOTE — Progress Notes (Signed)
Complex Care Management Note  Care Guide Note 05/30/2023 Name: CATALYNA HALLECK MRN: [id number] DOB: July 13, 1950  Hedda Mitri Zingale is a 72 y.o. year old female who sees Luking, Jonna Coup, MD for primary care. I reached out to Windy Canny Carnero by phone today to offer complex care management services.  Ms. Laurin was given information about Complex Care Management services today including:   The Complex Care Management services include support from the care team which includes your Nurse Coordinator, Clinical Social Worker, or Pharmacist.  The Complex Care Management team is here to help remove barriers to the health concerns and goals most important to you. Complex Care Management services are voluntary, and the patient may decline or stop services at any time by request to their care team member.   Complex Care Management Consent Status: Patient agreed to services and verbal consent obtained.   Follow up plan:  Telephone appointment with complex care management team member scheduled for:  06/05/23  Encounter Outcome:  Patient Scheduled

## 2023-06-01 DIAGNOSIS — G2581 Restless legs syndrome: Secondary | ICD-10-CM | POA: Diagnosis not present

## 2023-06-01 DIAGNOSIS — I442 Atrioventricular block, complete: Secondary | ICD-10-CM | POA: Diagnosis not present

## 2023-06-01 DIAGNOSIS — G43909 Migraine, unspecified, not intractable, without status migrainosus: Secondary | ICD-10-CM | POA: Diagnosis not present

## 2023-06-01 DIAGNOSIS — I447 Left bundle-branch block, unspecified: Secondary | ICD-10-CM | POA: Diagnosis not present

## 2023-06-01 DIAGNOSIS — J45909 Unspecified asthma, uncomplicated: Secondary | ICD-10-CM | POA: Diagnosis not present

## 2023-06-01 DIAGNOSIS — F418 Other specified anxiety disorders: Secondary | ICD-10-CM

## 2023-06-01 DIAGNOSIS — S32519D Fracture of superior rim of unspecified pubis, subsequent encounter for fracture with routine healing: Secondary | ICD-10-CM | POA: Diagnosis not present

## 2023-06-01 DIAGNOSIS — I700 Atherosclerosis of aorta: Secondary | ICD-10-CM | POA: Diagnosis not present

## 2023-06-01 DIAGNOSIS — S32110D Nondisplaced Zone I fracture of sacrum, subsequent encounter for fracture with routine healing: Secondary | ICD-10-CM | POA: Diagnosis not present

## 2023-06-01 DIAGNOSIS — K589 Irritable bowel syndrome without diarrhea: Secondary | ICD-10-CM | POA: Diagnosis not present

## 2023-06-01 DIAGNOSIS — M47817 Spondylosis without myelopathy or radiculopathy, lumbosacral region: Secondary | ICD-10-CM | POA: Diagnosis not present

## 2023-06-01 DIAGNOSIS — I4891 Unspecified atrial fibrillation: Secondary | ICD-10-CM | POA: Diagnosis not present

## 2023-06-02 ENCOUNTER — Other Ambulatory Visit: Payer: Self-pay | Admitting: Family Medicine

## 2023-06-02 ENCOUNTER — Encounter: Payer: Self-pay | Admitting: Family Medicine

## 2023-06-02 MED ORDER — HYDROCODONE-ACETAMINOPHEN 10-325 MG PO TABS: ORAL_TABLET | ORAL | 0 refills | Status: DC

## 2023-06-03 ENCOUNTER — Inpatient Hospital Stay: Payer: Medicare Other | Admitting: Family Medicine

## 2023-06-05 ENCOUNTER — Ambulatory Visit: Payer: Medicare Other | Admitting: *Deleted

## 2023-06-05 NOTE — Patient Outreach (Signed)
  Care Coordination   06/05/2023 Name: SHAHED YEOMAN MRN: [id number] DOB: 1951/02/03   Care Coordination Outreach Attempts:  An unsuccessful outreach was attempted for an appointment today.  Follow Up Plan:  Additional outreach attempts will be made to offer the patient complex care management information and services.   Encounter Outcome:  No Answer   Care Coordination Interventions:  No, not indicated     Tanna Loeffler L. Ramonita, RN, BSN, [facility]  VBCI Care Management Coordinator  [phone]  Fax: [phone]

## 2023-06-06 DIAGNOSIS — I442 Atrioventricular block, complete: Secondary | ICD-10-CM | POA: Diagnosis not present

## 2023-06-06 DIAGNOSIS — S32110D Nondisplaced Zone I fracture of sacrum, subsequent encounter for fracture with routine healing: Secondary | ICD-10-CM | POA: Diagnosis not present

## 2023-06-06 DIAGNOSIS — S32591D Other specified fracture of right pubis, subsequent encounter for fracture with routine healing: Secondary | ICD-10-CM | POA: Diagnosis not present

## 2023-06-06 DIAGNOSIS — G43909 Migraine, unspecified, not intractable, without status migrainosus: Secondary | ICD-10-CM | POA: Diagnosis not present

## 2023-06-06 DIAGNOSIS — I4891 Unspecified atrial fibrillation: Secondary | ICD-10-CM | POA: Diagnosis not present

## 2023-06-10 ENCOUNTER — Ambulatory Visit (INDEPENDENT_AMBULATORY_CARE_PROVIDER_SITE_OTHER): Payer: Medicare Other

## 2023-06-10 DIAGNOSIS — I442 Atrioventricular block, complete: Secondary | ICD-10-CM | POA: Diagnosis not present

## 2023-06-11 ENCOUNTER — Ambulatory Visit: Payer: Self-pay | Admitting: *Deleted

## 2023-06-11 ENCOUNTER — Telehealth: Payer: Self-pay | Admitting: *Deleted

## 2023-06-11 LAB — CUP PACEART REMOTE DEVICE CHECK
Battery Remaining Longevity: 26 mo
Battery Remaining Percentage: 21 %
Battery Voltage: 2.9 V
Brady Statistic AP VP Percent: 1 %
Brady Statistic AP VS Percent: 0 %
Brady Statistic AS VP Percent: 99 %
Brady Statistic AS VS Percent: 1 %
Brady Statistic RA Percent Paced: 1 %
Brady Statistic RV Percent Paced: 99 %
Date Time Interrogation Session: 20250107020015
Implantable Lead Connection Status: 753985
Implantable Lead Connection Status: 753985
Implantable Lead Implant Date: 20170418
Implantable Lead Implant Date: 20170418
Implantable Lead Location: 753859
Implantable Lead Location: 753860
Implantable Pulse Generator Implant Date: 20170418
Lead Channel Impedance Value: 480 Ohm
Lead Channel Impedance Value: 580 Ohm
Lead Channel Pacing Threshold Amplitude: 0.5 V
Lead Channel Pacing Threshold Amplitude: 1 V
Lead Channel Pacing Threshold Pulse Width: 0.5 ms
Lead Channel Pacing Threshold Pulse Width: 0.5 ms
Lead Channel Sensing Intrinsic Amplitude: 5 mV
Lead Channel Sensing Intrinsic Amplitude: 7.5 mV
Lead Channel Setting Pacing Amplitude: 1.25 V
Lead Channel Setting Pacing Amplitude: 2 V
Lead Channel Setting Pacing Pulse Width: 0.5 ms
Lead Channel Setting Sensing Sensitivity: 2 mV
Pulse Gen Model: 2272
Pulse Gen Serial Number: 7882213

## 2023-06-11 NOTE — Progress Notes (Signed)
 Complex Care Management Care Guide Note  06/11/2023 Name: [name] MRN: [id number] DOB: 1950/07/02  [name] is a 73 y.o. year old female who is a primary care patient of Luking, Glendia LABOR, MD and is actively engaged with the care management team. I reached out to Santa G Sprague by phone today to assist with re-scheduling  with the RN Case Manager.  Follow up plan: Telephone appointment with complex care management team member scheduled for:  06/11/23  [facility]  Care Coordination Care Guide  Direct Dial: [phone]

## 2023-06-11 NOTE — Progress Notes (Signed)
 Complex Care Management Care Guide Note  06/11/2023 Name: [name] MRN: [id number] DOB: 12-21-1950  [name] is a 73 y.o. year old female who is a primary care patient of Luking, Glendia LABOR, MD and is actively engaged with the care management team. I reached out to [name] by phone today to assist with re-scheduling  with the RN Case Manager.  Follow up plan: Unsuccessful telephone outreach attempt made. A HIPAA compliant phone message was left for the patient providing contact information and requesting a return call.  [facility]  Care Coordination Care Guide  Direct Dial: [phone]

## 2023-06-11 NOTE — Patient Instructions (Addendum)
 Visit Information  Thank you for taking time to visit with me today. Please don't hesitate to contact me if I can be of assistance to you.   Following are the goals we discussed today:   Goals Addressed             This Visit's Progress    home management of sacral fracture, pain management - RN CM care manager       Interventions Today    Flowsheet Row Most Recent Value  Chronic Disease   Chronic disease during today's visit Other  [Sacral fracture healing, pain management medicines used less, prepping for upcoming HH PT sessions migrains, asthma, pacemaker]  General Interventions   General Interventions Discussed/Reviewed General Interventions Reviewed, Doctor Visits, Durable Medical Equipment (DME)  Doctor Visits Discussed/Reviewed Doctor Visits Reviewed, PCP, Specialist  Durable Medical Equipment (DME) Shower bench, Environmental Consultant, Wheelchair  Wheelchair Standard  PCP/Specialist Visits Compliance with follow-up visit  Exercise Interventions   Exercise Discussed/Reviewed Exercise Reviewed, Physical Activity, Assistive device use and maintanence  [confirmed she is independt with dressing but requires assist with getting in and out of shower]  Physical Activity Discussed/Reviewed Physical Activity Reviewed, Home Exercise Program (HEP)  [confirmed home health is active]  Mental Health Interventions   Mental Health Discussed/Reviewed Mental Health Reviewed, Coping Strategies, Other  [provided cone helath complaint line [phone] for her to share her experience]      Interventions Today    Flowsheet Row Most Recent Value  Chronic Disease   Chronic disease during today's visit Other  [Sacral fracture healing, pain management medicines used less, prepping for upcoming HH PT sessions migrains, asthma, pacemaker]  General Interventions   General Interventions Discussed/Reviewed General Interventions Reviewed, Doctor Visits, Durable Medical Equipment (DME)  Doctor Visits Discussed/Reviewed  Doctor Visits Reviewed, PCP, Specialist  Durable Medical Equipment (DME) Shower bench, Environmental Consultant, Wheelchair  Wheelchair Standard  PCP/Specialist Visits Compliance with follow-up visit  Exercise Interventions   Exercise Discussed/Reviewed Exercise Reviewed, Physical Activity, Assistive device use and maintanence  [confirmed she is independt with dressing but requires assist with getting in and out of shower]  Physical Activity Discussed/Reviewed Physical Activity Reviewed, Home Exercise Program (HEP)  [confirmed home health is active]  Mental Health Interventions   Mental Health Discussed/Reviewed Mental Health Reviewed, Coping Strategies, Other  [provided cone helath complaint line [phone] for her to share her experience]     Patient will improve mobility with home health PT sessions Patient will be able to manage pain at home at a pain level below 5   Interventions Today    Flowsheet Row Most Recent Value  Chronic Disease   Chronic disease during today's visit Other  [Sacral fracture healing, pain management medicines used less, prepping for upcoming HH PT sessions migrains, asthma, pacemaker]  General Interventions   General Interventions Discussed/Reviewed General Interventions Reviewed, Doctor Visits, Durable Medical Equipment (DME)  Doctor Visits Discussed/Reviewed Doctor Visits Reviewed, PCP, Specialist  Durable Medical Equipment (DME) Shower bench, Environmental Consultant, Wheelchair  Wheelchair Standard  PCP/Specialist Visits Compliance with follow-up visit  Exercise Interventions   Exercise Discussed/Reviewed Exercise Reviewed, Physical Activity, Assistive device use and maintanence  [confirmed she is independt with dressing but requires assist with getting in and out of shower]  Physical Activity Discussed/Reviewed Physical Activity Reviewed, Home Exercise Program (HEP)  [confirmed home health is active]  Mental Health Interventions   Mental Health Discussed/Reviewed Mental Health Reviewed,  Coping Strategies, Other  [provided cone helath complaint line [phone] for  her to share her experience]              Our next appointment is by telephone on 06/19/23 at 3 pm  Please call the care guide team at [phone] if you need to cancel or reschedule your appointment.   If you are experiencing a Mental Health or Behavioral Health Crisis or need someone to talk to, please call the Suicide and Crisis Lifeline: 988 call the [place]  National Suicide Prevention Lifeline: 857-634-8088 or TTY: 612-312-1316 TTY (786) 705-2514) to talk to a trained counselor call 1-800-273-TALK (toll free, 24 hour hotline) call the [facility]: 602-779-2516 call 911   Patient verbalizes understanding of instructions and care plan provided today and agrees to view in MyChart. Active MyChart status and patient understanding of how to access instructions and care plan via MyChart confirmed with patient.     The patient has been provided with contact information for the care management team and has been advised to call with any health related questions or concerns.   Dorissa Stinnette L. Ramonita, RN, BSN, CCM [place]  [facility], [facility] RN Care Manager Direct Dial: [phone]  Fax: [phone] Mailing Address: [address]  [place] [address] Website: [email or web address]

## 2023-06-11 NOTE — Patient Outreach (Signed)
 Care Coordination   Follow Up Visit Note   07/29/2023 updated for 06/11/23 Name: [name] MRN: [id number] DOB: [birth year]  [name] is a 73 y.o. year old female who sees Luking, Glendia LABOR, MD for primary care. I spoke with  [name] by phone today.  What matters to the patients health and wellness today?  The patient is experiencing severe pain from a sacral fracture and has mobility issues. She feels her pain has not been adequately managed and is awaiting a referral to an orthopedic specialist. Currently, she uses a wheelchair and minimally uses a walker, walking only to the bathroom.  She declined a hospital bed but has physical therapy [facility] until she is to be seen by orthopedics. next week The patient can herself. She does require assistance to get in and out of her shower (shower chair)  States she does not need further equipment like a brace. Her pain management has included a cortisone shot, oxycodone   and it was later changed to hydrocodone . Currently, her pain level is at 3, with restrictions on movement. For complaints, she was provided the cone complaint line to call [phone].   Goals Addressed             This Visit's Progress    home management of sacral fracture, pain management - RN CM care manager       Interventions Today    Flowsheet Row Most Recent Value  Chronic Disease   Chronic disease during today's visit Other  [Sacral fracture healing, pain management medicines used less, prepping for upcoming HH PT sessions migrains, asthma, pacemaker]  General Interventions   General Interventions Discussed/Reviewed General Interventions Reviewed, Doctor Visits, Durable Medical Equipment (DME)  Doctor Visits Discussed/Reviewed Doctor Visits Reviewed, PCP, Specialist  Durable Medical Equipment (DME) Shower bench, Environmental Consultant, Wheelchair  Wheelchair Standard  PCP/Specialist Visits Compliance with follow-up visit  Exercise Interventions   Exercise  Discussed/Reviewed Exercise Reviewed, Physical Activity, Assistive device use and maintanence  [confirmed she is independt with dressing but requires assist with getting in and out of shower]  Physical Activity Discussed/Reviewed Physical Activity Reviewed, Home Exercise Program (HEP)  [confirmed home health is active]  Mental Health Interventions   Mental Health Discussed/Reviewed Mental Health Reviewed, Coping Strategies, Other  [provided cone helath complaint line [phone] for her to share her experience]      Interventions Today    Flowsheet Row Most Recent Value  Chronic Disease   Chronic disease during today's visit Other  [Sacral fracture healing, pain management medicines used less, prepping for upcoming HH PT sessions migrains, asthma, pacemaker]  General Interventions   General Interventions Discussed/Reviewed General Interventions Reviewed, Doctor Visits, Durable Medical Equipment (DME)  Doctor Visits Discussed/Reviewed Doctor Visits Reviewed, PCP, Specialist  Durable Medical Equipment (DME) Shower bench, Environmental Consultant, Wheelchair  Wheelchair Standard  PCP/Specialist Visits Compliance with follow-up visit  Exercise Interventions   Exercise Discussed/Reviewed Exercise Reviewed, Physical Activity, Assistive device use and maintanence  [confirmed she is independt with dressing but requires assist with getting in and out of shower]  Physical Activity Discussed/Reviewed Physical Activity Reviewed, Home Exercise Program (HEP)  [confirmed home health is active]  Mental Health Interventions   Mental Health Discussed/Reviewed Mental Health Reviewed, Coping Strategies, Other  [provided cone helath complaint line [phone] for her to share her experience]     Patient will improve mobility with home health PT sessions Patient will be able to manage pain at home at a pain  level below 5   Interventions Today    Flowsheet Row Most Recent Value  Chronic Disease   Chronic disease during  today's visit Other  [Sacral fracture healing, pain management medicines used less, prepping for upcoming HH PT sessions migrains, asthma, pacemaker]  General Interventions   General Interventions Discussed/Reviewed General Interventions Reviewed, Doctor Visits, Durable Medical Equipment (DME)  Doctor Visits Discussed/Reviewed Doctor Visits Reviewed, PCP, Specialist  Durable Medical Equipment (DME) Shower bench, Environmental Consultant, Psychologist, Forensic  PCP/Specialist Visits Compliance with follow-up visit  Exercise Interventions   Exercise Discussed/Reviewed Exercise Reviewed, Physical Activity, Assistive device use and maintanence  [confirmed she is independt with dressing but requires assist with getting in and out of shower]  Physical Activity Discussed/Reviewed Physical Activity Reviewed, Home Exercise Program (HEP)  [confirmed home health is active]  Mental Health Interventions   Mental Health Discussed/Reviewed Mental Health Reviewed, Coping Strategies, Other  [provided cone helath complaint line [phone] for her to share her experience]              SDOH assessments and interventions completed:  No     Care Coordination Interventions:  Yes, provided   Follow up plan: Follow up call scheduled for 06/19/23 1:30  pm     Encounter Outcome:  Patient Visit Completed   Vicenta Olds L. Ramonita, RN, BSN, [facility]  VBCI Care Management Coordinator  [phone]  Fax: [phone]

## 2023-06-12 ENCOUNTER — Other Ambulatory Visit: Payer: Self-pay | Admitting: Family Medicine

## 2023-06-12 ENCOUNTER — Encounter: Payer: Self-pay | Admitting: Family Medicine

## 2023-06-12 ENCOUNTER — Other Ambulatory Visit: Payer: Self-pay | Admitting: Nurse Practitioner

## 2023-06-12 MED ORDER — HYDROCODONE-ACETAMINOPHEN 10-325 MG PO TABS: ORAL_TABLET | ORAL | 0 refills | Status: DC

## 2023-06-13 DIAGNOSIS — S329XXA Fracture of unspecified parts of lumbosacral spine and pelvis, initial encounter for closed fracture: Secondary | ICD-10-CM | POA: Diagnosis not present

## 2023-06-18 DIAGNOSIS — S92355D Nondisplaced fracture of fifth metatarsal bone, left foot, subsequent encounter for fracture with routine healing: Secondary | ICD-10-CM | POA: Diagnosis not present

## 2023-06-18 DIAGNOSIS — S329XXA Fracture of unspecified parts of lumbosacral spine and pelvis, initial encounter for closed fracture: Secondary | ICD-10-CM | POA: Diagnosis not present

## 2023-06-19 ENCOUNTER — Ambulatory Visit: Payer: Self-pay | Admitting: *Deleted

## 2023-06-19 NOTE — Patient Instructions (Addendum)
Visit Information  Thank you for taking time to visit with me today. Please don't hesitate to contact me if I can be of assistance to you.   Following are the goals we discussed today:   Goals Addressed             This Visit's Progress    home management of sacral fracture, pain management - RN CM care manager   On track    Patient will improve mobility with home health PT sessions Patient will be able to manage pain at home at a pain level below 5   Interventions Today    Flowsheet Row Most Recent Value  Chronic Disease   Chronic disease during today's visit Other  [Sacral fracture healing, pain management medicines used less, prepping for upcoming HH PT sessions]  General Interventions   General Interventions Discussed/Reviewed General Interventions Reviewed, Durable Medical Equipment (DME), Community Resources, Doctor Visits, Communication with  Doctor Visits Discussed/Reviewed Doctor Visits Reviewed, PCP, Specialist  [confirmed follow up with her specialist recently -with noted helaing at 80% now walking and will be receiving HH PT sessions]  Durable Medical Equipment (DME) Walker  PCP/Specialist Visits Compliance with follow-up visit  Communication with PCP/Specialists, RN  [updated PCP staff of patient improvements]  Exercise Interventions   Exercise Discussed/Reviewed Exercise Discussed, Physical Activity, Assistive device use and maintanence  Physical Activity Discussed/Reviewed Physical Activity Discussed, Types of exercise, Home Exercise Program (HEP)  [will start PT sessions soon]  Education Interventions   Education Provided Provided Education  [prepping for pain managment about 30 minutes prior to [facility] PT sessions to help with decreasing pain during session and decreasing symptoms afterwards]  Provided Verbal Education On Medication, Community Resources  Mental Health Interventions   Mental Health Discussed/Reviewed Mental Health Discussed, Coping Strategies  Pharmacy  Interventions   Pharmacy Dicussed/Reviewed Pharmacy Topics Discussed, Medications and their functions, Affording Medications  Safety Interventions   Safety Discussed/Reviewed Safety Reviewed, Fall Risk, Home Safety  Home Safety Assistive Devices              Our next appointment is by telephone on 07/17/23 at 1:30 pm   Please call the care guide team at [phone] if you need to cancel or reschedule your appointment.   If you are experiencing a Mental Health or Behavioral Health Crisis or need someone to talk to, please call the Suicide and Crisis Lifeline: 988 call the [place] National Suicide Prevention Lifeline: 351-561-9574 or TTY: 305-851-6945 TTY 906-263-6235) to talk to a trained counselor call 1-800-273-TALK (toll free, 24 hour hotline) call the [facility]: [phone] call 911   Patient verbalizes understanding of instructions and care plan provided today and agrees to view in MyChart. Active MyChart status and patient understanding of how to access instructions and care plan via MyChart confirmed with patient.     The patient has been provided with contact information for the care management team and has been advised to call with any health related questions or concerns.   Romie Tay L. Noelle Penner, RN, BSN, CCM [place]  [facility], [facility] RN Care Manager Direct Dial: [phone]  Fax: [phone] Mailing Address: [address]  [place] [address] Website: [email or web address]

## 2023-06-19 NOTE — Patient Outreach (Signed)
Care Coordination   Follow Up Visit Note   06/19/2023 Name: [name] MRN: [id number] DOB: 23-Aug-1950  [name] is a 73 y.o. year old female who sees Luking, Jonna Coup, MD for primary care. I spoke with  Windy Canny Ballantine by phone today.  What matters to the patients health and wellness today?  Fracture healing, pain management medicines used less, prepping for upcoming [facility] PT sessions  Confirmed a visit to her specialist for follow up care  Using walker now and getting ready for home health physical therapy (HH PT) session    Pain- she has decreased pain medicines   Voiced understanding of suggestion for prepping with pain medicine 30 minutes prior to [facility] PT sessions Confirms future outreach     Goals Addressed             This Visit's Progress    home management of sacral fracture, pain management - RN CM care manager   On track    Interventions Today    Flowsheet Row Most Recent Value  Chronic Disease   Chronic disease during today's visit Other  [Sacral fracture healing, pain management medicines used less, prepping for upcoming HH PT sessions]  General Interventions   General Interventions Discussed/Reviewed General Interventions Reviewed, Durable Medical Equipment (DME), Community Resources, Doctor Visits, Communication with  Doctor Visits Discussed/Reviewed Doctor Visits Reviewed, PCP, Specialist  [confirmed follow up with her specialist recently -with noted helaing at 80% now walking and will be receiving HH PT sessions]  Durable Medical Equipment (DME) Walker  PCP/Specialist Visits Compliance with follow-up visit  Communication with PCP/Specialists, RN  [updated PCP staff of patient improvements]  Exercise Interventions   Exercise Discussed/Reviewed Exercise Discussed, Physical Activity, Assistive device use and maintanence  Physical Activity Discussed/Reviewed Physical Activity Discussed, Types of exercise, Home Exercise Program (HEP)  [will start PT  sessions soon]  Education Interventions   Education Provided Provided Education  [prepping for pain managment about 30 minutes prior to [facility] PT sessions to help with decreasing pain during session and decreasing symptoms afterwards]  Provided Verbal Education On Medication, Community Resources  Mental Health Interventions   Mental Health Discussed/Reviewed Mental Health Discussed, Coping Strategies  Pharmacy Interventions   Pharmacy Dicussed/Reviewed Pharmacy Topics Discussed, Medications and their functions, Affording Medications  Safety Interventions   Safety Discussed/Reviewed Safety Reviewed, Fall Risk, Home Safety  Home Safety Assistive Devices      Interventions Today    Flowsheet Row Most Recent Value  Chronic Disease   Chronic disease during today's visit Other  [Sacral fracture healing, pain management medicines used less, prepping for upcoming HH PT sessions]  General Interventions   General Interventions Discussed/Reviewed General Interventions Reviewed, Durable Medical Equipment (DME), Community Resources, Doctor Visits, Communication with  Doctor Visits Discussed/Reviewed Doctor Visits Reviewed, PCP, Specialist  [confirmed follow up with her specialist recently -with noted helaing at 80% now walking and will be receiving HH PT sessions]  Durable Medical Equipment (DME) Walker  PCP/Specialist Visits Compliance with follow-up visit  Communication with PCP/Specialists, RN  [updated PCP staff of patient improvements]  Exercise Interventions   Exercise Discussed/Reviewed Exercise Discussed, Physical Activity, Assistive device use and maintanence  Physical Activity Discussed/Reviewed Physical Activity Discussed, Types of exercise, Home Exercise Program (HEP)  [will start PT sessions soon]  Education Interventions   Education Provided Provided Education  [prepping for pain managment about 30 minutes prior to [facility] PT sessions to help with decreasing pain during session and decreasing  symptoms afterwards]  Provided Verbal Education On Medication, Community Resources  Mental Health Interventions   Mental Health Discussed/Reviewed Mental Health Discussed, Coping Strategies  Pharmacy Interventions   Pharmacy Dicussed/Reviewed Pharmacy Topics Discussed, Medications and their functions, Affording Medications  Safety Interventions   Safety Discussed/Reviewed Safety Reviewed, Fall Risk, Home Safety  Home Safety Assistive Devices     Patient will improve mobility with home health PT sessions Patient will be able to manage pain at home at a pain level below 5   Interventions Today    Flowsheet Row Most Recent Value  Chronic Disease   Chronic disease during today's visit Other  [Sacral fracture healing, pain management medicines used less, prepping for upcoming [facility] PT sessions]  General Interventions   General Interventions Discussed/Reviewed General Interventions Reviewed, Durable Medical Equipment (DME), Community Resources, Doctor Visits, Communication with  Doctor Visits Discussed/Reviewed Doctor Visits Reviewed, PCP, Specialist  [confirmed follow up with her specialist recently -with noted helaing at 80% now walking and will be receiving HH PT sessions]  Durable Medical Equipment (DME) Walker  PCP/Specialist Visits Compliance with follow-up visit  Communication with PCP/Specialists, RN  [updated PCP staff of patient improvements]  Exercise Interventions   Exercise Discussed/Reviewed Exercise Discussed, Physical Activity, Assistive device use and maintanence  Physical Activity Discussed/Reviewed Physical Activity Discussed, Types of exercise, Home Exercise Program (HEP)  [will start PT sessions soon]  Education Interventions   Education Provided Provided Education  [prepping for pain managment about 30 minutes prior to [facility] PT sessions to help with decreasing pain during session and decreasing symptoms afterwards]  Provided Verbal Education On Medication, Community Resources   Mental Health Interventions   Mental Health Discussed/Reviewed Mental Health Discussed, Coping Strategies  Pharmacy Interventions   Pharmacy Dicussed/Reviewed Pharmacy Topics Discussed, Medications and their functions, Affording Medications  Safety Interventions   Safety Discussed/Reviewed Safety Reviewed, Fall Risk, Home Safety  Home Safety Assistive Devices              SDOH assessments and interventions completed:  No     Care Coordination Interventions:  Yes, provided   Follow up plan: Follow up call scheduled for 07/17/23    Encounter Outcome:  Patient Visit Completed    Cala Bradford L. Noelle Penner, RN, BSN, CCM [place]  [facility], [facility] RN Care Manager Direct Dial: [phone]  Fax: [phone] Mailing Address: [address]  [place] [address] Website: [email or web address]

## 2023-06-23 ENCOUNTER — Other Ambulatory Visit: Payer: Self-pay | Admitting: Nurse Practitioner

## 2023-06-24 DIAGNOSIS — S32591D Other specified fracture of right pubis, subsequent encounter for fracture with routine healing: Secondary | ICD-10-CM | POA: Diagnosis not present

## 2023-06-24 DIAGNOSIS — I4891 Unspecified atrial fibrillation: Secondary | ICD-10-CM | POA: Diagnosis not present

## 2023-06-24 DIAGNOSIS — S32110D Nondisplaced Zone I fracture of sacrum, subsequent encounter for fracture with routine healing: Secondary | ICD-10-CM | POA: Diagnosis not present

## 2023-06-24 DIAGNOSIS — G43909 Migraine, unspecified, not intractable, without status migrainosus: Secondary | ICD-10-CM | POA: Diagnosis not present

## 2023-06-24 DIAGNOSIS — I442 Atrioventricular block, complete: Secondary | ICD-10-CM | POA: Diagnosis not present

## 2023-06-26 DIAGNOSIS — S32591D Other specified fracture of right pubis, subsequent encounter for fracture with routine healing: Secondary | ICD-10-CM | POA: Diagnosis not present

## 2023-06-26 DIAGNOSIS — I442 Atrioventricular block, complete: Secondary | ICD-10-CM | POA: Diagnosis not present

## 2023-06-26 DIAGNOSIS — G43909 Migraine, unspecified, not intractable, without status migrainosus: Secondary | ICD-10-CM | POA: Diagnosis not present

## 2023-06-26 DIAGNOSIS — S32110D Nondisplaced Zone I fracture of sacrum, subsequent encounter for fracture with routine healing: Secondary | ICD-10-CM | POA: Diagnosis not present

## 2023-06-26 DIAGNOSIS — I4891 Unspecified atrial fibrillation: Secondary | ICD-10-CM | POA: Diagnosis not present

## 2023-06-27 ENCOUNTER — Other Ambulatory Visit: Payer: Self-pay | Admitting: Nurse Practitioner

## 2023-06-27 DIAGNOSIS — E782 Mixed hyperlipidemia: Secondary | ICD-10-CM

## 2023-06-30 DIAGNOSIS — S92355D Nondisplaced fracture of fifth metatarsal bone, left foot, subsequent encounter for fracture with routine healing: Secondary | ICD-10-CM | POA: Diagnosis not present

## 2023-06-30 DIAGNOSIS — S329XXD Fracture of unspecified parts of lumbosacral spine and pelvis, subsequent encounter for fracture with routine healing: Secondary | ICD-10-CM | POA: Diagnosis not present

## 2023-07-01 DIAGNOSIS — S32110D Nondisplaced Zone I fracture of sacrum, subsequent encounter for fracture with routine healing: Secondary | ICD-10-CM | POA: Diagnosis not present

## 2023-07-01 DIAGNOSIS — I442 Atrioventricular block, complete: Secondary | ICD-10-CM | POA: Diagnosis not present

## 2023-07-01 DIAGNOSIS — S32591D Other specified fracture of right pubis, subsequent encounter for fracture with routine healing: Secondary | ICD-10-CM | POA: Diagnosis not present

## 2023-07-01 DIAGNOSIS — I4891 Unspecified atrial fibrillation: Secondary | ICD-10-CM | POA: Diagnosis not present

## 2023-07-01 DIAGNOSIS — G43909 Migraine, unspecified, not intractable, without status migrainosus: Secondary | ICD-10-CM | POA: Diagnosis not present

## 2023-07-07 ENCOUNTER — Encounter: Payer: Self-pay | Admitting: Family Medicine

## 2023-07-07 ENCOUNTER — Other Ambulatory Visit: Payer: Self-pay | Admitting: Nurse Practitioner

## 2023-07-07 MED ORDER — HYDROCODONE-ACETAMINOPHEN 10-325 MG PO TABS: ORAL_TABLET | ORAL | 0 refills | Status: DC

## 2023-07-10 ENCOUNTER — Ambulatory Visit: Payer: Medicare Other | Admitting: Family Medicine

## 2023-07-10 VITALS — BP 112/72 | HR 80 | Temp 97.3°F | Ht 64.0 in | Wt 174.0 lb

## 2023-07-10 DIAGNOSIS — S3289XD Fracture of other parts of pelvis, subsequent encounter for fracture with routine healing: Secondary | ICD-10-CM

## 2023-07-10 DIAGNOSIS — M5431 Sciatica, right side: Secondary | ICD-10-CM

## 2023-07-10 MED ORDER — PREGABALIN 25 MG PO CAPS: 25.0000 mg | ORAL_CAPSULE | Freq: Two times a day (BID) | ORAL | 4 refills | Status: DC

## 2023-07-10 NOTE — Progress Notes (Signed)
   Subjective:    Patient ID: [name], female    DOB: [birth year], 73 y.o.   MRN: [id number]  Discussed the use of AI scribe software for clinical note transcription with the patient, who gave verbal consent to proceed.  History of Present Illness   [name] is a 73 year old female with a history of pelvic fracture who presents with worsening pain and sciatica symptoms.  She experiences worsening pain and sciatica symptoms, primarily on the right side. The pain began approximately three weeks ago after she increased her walking activity following recovery from a pelvic fracture sustained in December. Initially, she was managing well without a walker, but then developed severe low back pain radiating down her right leg, now extending into her calf. Recent imaging, including x-rays, showed no new fractures or displacements.  She uses hydrocodone  for pain management but experiences bad dreams with this medication. Previously, she used oxycodone , which did not cause bad dreams but resulted in a feeling of being 'loopy.' She has a history of not tolerating Cymbalta  due to bad dreams and has used gabapentin , which caused tiredness and drowsiness while working. Currently, she is not working and is considering medication options. She takes over-the-counter ibuprofen , two tablets once daily, and is cautious about medication use, preferring to avoid taking more than necessary.  She limits walking to essential movements with a walker. Physical therapy was initially part of her recovery, but she is not currently engaging in it. She performs stretching exercises, which she finds helpful.  She is active, having recently taken up studying coin collecting. She is involved in her granddaughter's dialysis care at home, which adds to her stress levels. She engages in Bible study and maintains contact with friends.         Review of Systems     Objective:    Physical Exam       Walking with  walker       Assessment & Plan:  Assessment and Plan    Pelvic Fracture with Sciatica Persistent pain in the right lower back radiating down the leg, worsening over time. X-rays showed no new fractures or displacement. Likely nerve irritation causing sciatica. -Continue limited mobility with walker for necessary movements only. -Initiate stretching exercises before getting up and throughout the day. -Start Lyrica , initially 1 dose in the evening for 5 days, then twice daily. Monitor for side effects and effectiveness. -Continue Hydrocodone  as needed for pain management.  Osteoporosis History of pelvic fracture. Due for a bone density test to assess current status and potential need for treatment change. -Schedule bone density test. -Consider referral to endocrinologist for potential Prolia treatment depending on bone density results.  Follow-up Plans -Send MyChart message in 10 days to update on Lyrica  tolerance and effectiveness. -Follow-up appointment in approximately 3-4 months. -Continue to communicate via MyChart for Hydrocodone  refills as needed.     Patient with significant amount of pain and discomfort with the pelvic fracture as well as sciatica Conservative approach If progressive symptoms or weakness starts occurring would need to progress for MRI Follow-up in approximately 8 to 12 weeks If not making significant progress over the next 6 to 8 weeks to notify us  If having side effects from Lyrica  stop medication notify us  Pain medication for occasional use but not frequent use Patient not driving currently

## 2023-07-12 ENCOUNTER — Other Ambulatory Visit: Payer: Self-pay | Admitting: Family Medicine

## 2023-07-13 ENCOUNTER — Other Ambulatory Visit: Payer: Self-pay | Admitting: Nurse Practitioner

## 2023-07-14 ENCOUNTER — Encounter: Payer: Self-pay | Admitting: Family Medicine

## 2023-07-14 ENCOUNTER — Other Ambulatory Visit: Payer: Self-pay

## 2023-07-14 ENCOUNTER — Other Ambulatory Visit: Payer: Self-pay | Admitting: Family Medicine

## 2023-07-14 DIAGNOSIS — S329XXA Fracture of unspecified parts of lumbosacral spine and pelvis, initial encounter for closed fracture: Secondary | ICD-10-CM | POA: Diagnosis not present

## 2023-07-14 MED ORDER — PANTOPRAZOLE SODIUM 40 MG PO TBEC: 40.0000 mg | DELAYED_RELEASE_TABLET | Freq: Two times a day (BID) | ORAL | 1 refills | Status: DC

## 2023-07-14 MED ORDER — ROPINIROLE HCL 0.5 MG PO TABS: 0.5000 mg | ORAL_TABLET | ORAL | 1 refills | Status: DC

## 2023-07-17 ENCOUNTER — Ambulatory Visit: Payer: Self-pay | Admitting: *Deleted

## 2023-07-17 NOTE — Patient Outreach (Signed)
  Care Coordination   07/17/2023 Name: [name] MRN: [id number] DOB: 06/27/1950   Care Coordination Outreach Attempts:  An unsuccessful outreach was attempted for an appointment today.  Follow Up Plan:  No further outreach attempts will be made at this time. We have been unable to contact the patient to offer or enroll patient in complex care management services.  Encounter Outcome:  No Answer   Care Coordination Interventions:  No, not indicated     Hersey Maclellan L. Noelle Penner, RN, BSN, CCM [place]  [facility], [facility] Health RN Care Manager Direct Dial: [phone]  Fax: [phone] Mailing Address: [address]  [place] [address] Website: [email or web address]

## 2023-07-21 ENCOUNTER — Encounter: Payer: Self-pay | Admitting: Family Medicine

## 2023-07-22 ENCOUNTER — Other Ambulatory Visit: Payer: Self-pay | Admitting: Family Medicine

## 2023-07-22 MED ORDER — PREGABALIN 50 MG PO CAPS: 50.0000 mg | ORAL_CAPSULE | Freq: Three times a day (TID) | ORAL | 2 refills | Status: DC

## 2023-07-22 MED ORDER — HYDROCODONE-ACETAMINOPHEN 10-325 MG PO TABS: ORAL_TABLET | ORAL | 0 refills | Status: DC

## 2023-07-22 NOTE — Telephone Encounter (Signed)
Nurses Please go ahead with consultation for home physical therapy through [facility] for pelvic pain and pelvic fracture  Also you may share this note with Renaldo Fiddler As for the Lyrica I would gradually titrate upward. Utilizing the 25 mg capsules I would take 1 in the morning 1 in the middle of the day and 2 in the evening for 3 days Then And 2 in the morning, 1 in the middle of the day, and 2 in the evening until you run out of the 25 mg which would more than likely be approximately 3 more days  Then you can start 50 mg Lyrica 1 in the morning, 1 in the middle of the day, 1 in the evening  Obviously if these are making you feel drowsy, dopey, drugged feeling then back down to 1 in the morning and 1 in the evening  If you are tolerating the 50 mg 3 times daily utilize this over the next 2 to 3 weeks then give me an update  The goal is to see less pain and discomfort in the pelvis region as well as less pain down the leg  You may use the hydrocodone when necessary but be aware that it can cause drowsiness do not drive if feeling drowsy  Keep your appointment with orthopedics, keep follow-up visit this spring with [place], send [place] an update within 2 to 3 weeks sooner if any issues problems or concerns or questions regarding the above  Thank you-Dr. Lorin Picket

## 2023-07-23 ENCOUNTER — Other Ambulatory Visit: Payer: Self-pay

## 2023-07-23 DIAGNOSIS — S3289XD Fracture of other parts of pelvis, subsequent encounter for fracture with routine healing: Secondary | ICD-10-CM

## 2023-07-23 NOTE — Progress Notes (Signed)
 Remote pacemaker transmission.

## 2023-07-23 NOTE — Addendum Note (Signed)
Addended by: Geralyn Flash D on: 07/23/2023 11:36 AM   Modules accepted: Orders

## 2023-07-28 DIAGNOSIS — S329XXD Fracture of unspecified parts of lumbosacral spine and pelvis, subsequent encounter for fracture with routine healing: Secondary | ICD-10-CM | POA: Diagnosis not present

## 2023-07-29 ENCOUNTER — Ambulatory Visit: Payer: Self-pay | Admitting: *Deleted

## 2023-07-29 NOTE — Patient Outreach (Signed)
 Care Coordination   07/29/2023 Name: [name] MRN: [id number] DOB: 10/17/1950   Care Coordination Outreach Attempts:  An unsuccessful outreach was attempted for an appointment today.  Follow Up Plan:  Additional outreach attempts will be made to offer the patient complex care management information and services.   Encounter Outcome:  No Answer   Care Coordination Interventions:  No, not indicated     Kayleigh Broadwell L. Noelle Penner, RN, BSN, CCM [name]  [facility], [facility] Health RN Care Manager Direct Dial: [phone]  Fax: [phone] Mailing Address: [address]  [place] [address] Website: [email or web address]

## 2023-08-02 ENCOUNTER — Encounter: Payer: Self-pay | Admitting: Family Medicine

## 2023-08-05 ENCOUNTER — Telehealth: Payer: Self-pay | Admitting: *Deleted

## 2023-08-05 NOTE — Progress Notes (Signed)
 Complex Care Management Care Guide Note  08/05/2023 Name: [name] MRN: [id number] DOB: 31-Mar-1951  [name] is a 73 y.o. year old female who is a primary care patient of Luking, Jonna Coup, MD and is actively engaged with the care management team. I reached out to Windy Canny Schleicher by phone today to assist with re-scheduling  with the RN Case Manager.  Follow up plan: Telephone outreach attempt made.   Gwenevere Ghazi  [facility]  [facility], [facility] Guide  Direct Dial: [phone]  Fax [phone]

## 2023-08-11 DIAGNOSIS — S329XXA Fracture of unspecified parts of lumbosacral spine and pelvis, initial encounter for closed fracture: Secondary | ICD-10-CM | POA: Diagnosis not present

## 2023-08-11 NOTE — Progress Notes (Signed)
 Complex Care Management Care Guide Note  08/11/2023 Name: [name] MRN: [id number] DOB: Jan 17, 1951  [name] is a 73 y.o. year old female who is a primary care patient of Luking, Jonna Coup, MD and is actively engaged with the care management team. I reached out to [name] by phone today to assist with re-scheduling  with the RN Case Manager.  Follow up plan: Unsuccessful telephone outreach attempt made. A HIPAA compliant phone message was left for the patient providing contact information and requesting a return call.  [name]  [facility] Health  [facility], [facility] Guide  Direct Dial: [phone]  Fax [phone]

## 2023-08-20 NOTE — Progress Notes (Signed)
 Complex Care Management Care Guide Note  08/20/2023 Name: [name] MRN: [id number] DOB: 03-16-1951  [name] is a 73 y.o. year old female who is a primary care patient of Luking, Jonna Coup, MD and is actively engaged with the care management team. I reached out to Windy Canny Dietz by phone today to assist with re-scheduling  with the RN Case Manager.  Follow up plan: Successful telephone outreach attempt made. Patient [name], is not ready to schedule follow up with [facility] or call with LCSW at this time due to loss of spouse. Will follow up in May to reschedule follow up as requested. Patient given contact information and explained to call if she changes her mind and needs care team prior to May.   Gwenevere Ghazi  [facility] Health  [facility], [facility] Guide  Direct Dial: [phone]  Fax [phone]

## 2023-08-30 ENCOUNTER — Other Ambulatory Visit: Payer: Self-pay | Admitting: Family Medicine

## 2023-09-09 ENCOUNTER — Ambulatory Visit (INDEPENDENT_AMBULATORY_CARE_PROVIDER_SITE_OTHER): Payer: Self-pay

## 2023-09-09 DIAGNOSIS — I442 Atrioventricular block, complete: Secondary | ICD-10-CM

## 2023-09-10 LAB — CUP PACEART REMOTE DEVICE CHECK
Battery Remaining Longevity: 23 mo
Battery Remaining Percentage: 19 %
Battery Voltage: 2.89 V
Brady Statistic AP VP Percent: 1 %
Brady Statistic AP VS Percent: 0 %
Brady Statistic AS VP Percent: 99 %
Brady Statistic AS VS Percent: 1 %
Brady Statistic RA Percent Paced: 1 %
Brady Statistic RV Percent Paced: 99 %
Date Time Interrogation Session: 20250408020020
Implantable Lead Connection Status: 753985
Implantable Lead Connection Status: 753985
Implantable Lead Implant Date: 20170418
Implantable Lead Implant Date: 20170418
Implantable Lead Location: 753859
Implantable Lead Location: 753860
Implantable Pulse Generator Implant Date: 20170418
Lead Channel Impedance Value: 440 Ohm
Lead Channel Impedance Value: 480 Ohm
Lead Channel Pacing Threshold Amplitude: 0.5 V
Lead Channel Pacing Threshold Amplitude: 0.875 V
Lead Channel Pacing Threshold Pulse Width: 0.5 ms
Lead Channel Pacing Threshold Pulse Width: 0.5 ms
Lead Channel Sensing Intrinsic Amplitude: 4.7 mV
Lead Channel Sensing Intrinsic Amplitude: 7.5 mV
Lead Channel Setting Pacing Amplitude: 1.125
Lead Channel Setting Pacing Amplitude: 2 V
Lead Channel Setting Pacing Pulse Width: 0.5 ms
Lead Channel Setting Sensing Sensitivity: 2 mV
Pulse Gen Model: 2272
Pulse Gen Serial Number: 7882213

## 2023-09-19 DIAGNOSIS — Z79899 Other long term (current) drug therapy: Secondary | ICD-10-CM | POA: Diagnosis not present

## 2023-09-19 DIAGNOSIS — R5383 Other fatigue: Secondary | ICD-10-CM | POA: Diagnosis not present

## 2023-09-19 DIAGNOSIS — E782 Mixed hyperlipidemia: Secondary | ICD-10-CM | POA: Diagnosis not present

## 2023-09-20 LAB — LIPID PANEL
Chol/HDL Ratio: 2.5 ratio (ref 0.0–4.4)
Cholesterol, Total: 184 mg/dL (ref 100–199)
HDL: 74 mg/dL (ref >39)
LDL Chol Calc (NIH): 97 mg/dL (ref 0–99)
Triglycerides: 71 mg/dL (ref 0–149)
VLDL Cholesterol Cal: 13 mg/dL (ref 5–40)

## 2023-09-20 LAB — HEPATIC FUNCTION PANEL
ALT: 16 IU/L (ref 0–32)
AST: 21 IU/L (ref 0–40)
Albumin: 4.2 g/dL (ref 3.8–4.8)
Alkaline Phosphatase: 75 IU/L (ref 44–121)
Bilirubin Total: 0.2 mg/dL (ref 0.0–1.2)
Bilirubin, Direct: 0.1 mg/dL (ref 0.00–0.40)
Total Protein: 6.6 g/dL (ref 6.0–8.5)

## 2023-09-20 LAB — TSH: TSH: 1.58 u[IU]/mL (ref 0.450–4.500)

## 2023-09-22 ENCOUNTER — Telehealth: Payer: Self-pay

## 2023-09-22 NOTE — Telephone Encounter (Signed)
 Error

## 2023-09-25 ENCOUNTER — Other Ambulatory Visit: Payer: Self-pay | Admitting: Nurse Practitioner

## 2023-09-25 ENCOUNTER — Other Ambulatory Visit: Payer: Self-pay | Admitting: Family Medicine

## 2023-09-25 DIAGNOSIS — E782 Mixed hyperlipidemia: Secondary | ICD-10-CM

## 2023-09-26 ENCOUNTER — Ambulatory Visit: Payer: Medicare Other

## 2023-09-26 VITALS — Ht 64.0 in | Wt 174.0 lb

## 2023-09-26 DIAGNOSIS — Z0000 Encounter for general adult medical examination without abnormal findings: Secondary | ICD-10-CM

## 2023-09-26 NOTE — Patient Instructions (Signed)
 Ms. Britain , Thank you for taking time to come for your Medicare Wellness Visit. I appreciate your ongoing commitment to your health goals. Please review the following plan we discussed and let me know if I can assist you in the future.   Referrals/Orders/Follow-Ups/Clinician Recommendations: Aim for 30 minutes of exercise or brisk walking, 6-8 glasses of water , and 5 servings of fruits and vegetables each day.  This is a list of the screening recommended for you and due dates:  Health Maintenance  Topic Date Due   Mammogram  02/10/2022   Colon Cancer Screening  06/17/2022   DTaP/Tdap/Td vaccine (2 - Tdap) 03/30/2024*   Hepatitis C Screening  03/30/2024*   Flu Shot  01/02/2024   Medicare Annual Wellness Visit  09/25/2024   Pneumonia Vaccine  Completed   DEXA scan (bone density measurement)  Completed   Zoster (Shingles) Vaccine  Completed   HPV Vaccine  Aged Out   Meningitis B Vaccine  Aged Out   COVID-19 Vaccine  Discontinued  *Topic was postponed. The date shown is not the original due date.    Advanced directives: (ACP Link)Information on Advanced Care Planning can be found at [place]  Secretary of [facility] Advance Health Care Directives Advance Health Care Directives. [email or web address]   Next Medicare Annual Wellness Visit scheduled for next year: Yes

## 2023-09-26 NOTE — Progress Notes (Signed)
 Subjective:   [name] is a 73 y.o. who presents for a Medicare Wellness preventive visit.  Visit Complete: Virtual I connected with  [name] on 09/26/23 by a audio enabled telemedicine application and verified that I am speaking with the correct person using two identifiers.  Patient Location: Home  Provider Location: Home Office  I discussed the limitations of evaluation and management by telemedicine. The patient expressed understanding and agreed to proceed.  Vital Signs: Because this visit was a virtual/telehealth visit, some criteria may be missing or patient reported. Any vitals not documented were not able to be obtained and vitals that have been documented are patient reported.  VideoDeclined- This patient declined Librarian, academic. Therefore the visit was completed with audio only.  Persons Participating in Visit: Patient.  AWV Questionnaire: No: Patient Medicare AWV questionnaire was not completed prior to this visit.  Cardiac Risk Factors include: advanced age (>20men, >80 women)     Objective:    Today's Vitals   09/26/23 1238  Weight: 174 lb (78.9 kg)  Height: 5\' 4"  (1.626 m)   Body mass index is 29.87 kg/m.     09/26/2023   12:59 PM 05/11/2023   12:26 PM 02/24/2023   11:46 AM 02/20/2023   10:19 AM 01/31/2023    7:20 AM 01/28/2023   11:30 AM 09/20/2022   11:36 AM  Advanced Directives  Does Patient Have a Medical Advance Directive? No Yes Yes No No No Yes  Type of Special educational needs teacher of [place];Living will Healthcare Power of [place];Living will    Living will  Does patient want to make changes to medical advance directive?  No - Patient declined     No - Patient declined  Copy of Healthcare Power of Attorney in Chart?  No - copy requested       Would patient like information on creating a medical advance directive? Yes (MAU/Ambulatory/Procedural Areas - Information given)   No - Patient  declined No - Patient declined No - Patient declined     Current Medications (verified) Outpatient Encounter Medications as of 09/26/2023  Medication Sig   Ascorbic Acid (VITAMIN C) 1000 MG tablet Take 1,000 mg by mouth daily.   buPROPion  (WELLBUTRIN  XL) 150 MG 24 hr tablet TAKE 1 TABLET BY MOUTH EVERY DAY   cyanocobalamin (VITAMIN B12) 1000 MCG tablet Take 1,000 mcg by mouth See admin instructions. Take 1 tablet ( ) every other night.   Ginger, Zingiber officinalis, (GINGER PO) Take 1,000 mg by mouth daily.   HYDROcodone -acetaminophen  (NORCO) 10-325 MG tablet 1/2 tablet or 1 whole tablet every 4 hours as needed severe pain   ibuprofen  (ADVIL ) 400 MG tablet Take 1 tablet (400 mg total) by mouth every 8 (eight) hours as needed for headache, mild pain (pain score 1-3) or fever. Do not exceed more than 1200 mg in 24 hours.   levocetirizine (XYZAL ) 5 MG tablet Take 5 mg by mouth every evening.   montelukast  (SINGULAIR ) 10 MG tablet TAKE 1 TABLET BY MOUTH AT BEDTIME   Multiple Vitamins-Minerals (CENTRUM SILVER 50+WOMEN) TABS Take 1 tablet by mouth daily.   pantoprazole  (PROTONIX ) 40 MG tablet Take 1 tablet (40 mg total) by mouth daily.   pregabalin  (LYRICA ) 50 MG capsule Take 1 capsule (50 mg total) by mouth 3 (three) times daily.   rizatriptan  (MAXALT ) 10 MG tablet Take one at onset of migraine; may repeat in 2 hours if needed; max 2 per 24 hours  rOPINIRole  (REQUIP ) 0.5 MG tablet TAKE 1 TABLET BY MOUTH AT BEDTIME.   rosuvastatin  (CRESTOR ) 10 MG tablet TAKE 1 TABLET BY MOUTH EVERY DAY FOR CHOLESTEROL   topiramate  (TOPAMAX ) 100 MG tablet TAKE 1 TABLET BY MOUTH TWICE DAILY   traZODone  (DESYREL ) 50 MG tablet TAKE 1/2 TO 1 TABLET BY MOUTH AT BEDTIME AS NEEDED FOR SLEEP   No facility-administered encounter medications on file as of 09/26/2023.    Allergies (verified) Beta adrenergic blockers, Ambien  [zolpidem  tartrate], Biaxin  [clarithromycin ], Cymbalta  [duloxetine  hcl], and Fosamax   [alendronate  sodium]   History: Past Medical History:  Diagnosis Date   Acid reflux    Allergy     Anxiety    Asthma    Complete heart block (HCC) 09/2015   a. s/p St. Jude PPM 09/2015.   Dyspnea    Former tobacco use    Hyperlipidemia    Irritable bowel syndrome (IBS) 02/27/2017   Mast cell disease    Migraines    Chronic   PAT (paroxysmal atrial tachycardia) (HCC)    PONV (postoperative nausea and vomiting)    Presence of permanent cardiac pacemaker 09/19/2015   Sinusitis    Sleep apnea    pt denies   Urticaria    Past Surgical History:  Procedure Laterality Date   APPENDECTOMY     AUGMENTATION MAMMAPLASTY Bilateral    BIOPSY  06/17/2017   Procedure: BIOPSY;  Surgeon: Alyce Jubilee, MD;  [facility]: [facility];  Service: Endoscopy;;  colon   BREAST ENHANCEMENT SURGERY  1985   CATARACT EXTRACTION W/PHACO Right 01/31/2023   Procedure: CATARACT EXTRACTION PHACO AND INTRAOCULAR LENS PLACEMENT (IOC);  Surgeon: Tarri Farm, MD;  Location: [facility];  Service: Ophthalmology;  Laterality: Right;  CDE 6.69   CATARACT EXTRACTION W/PHACO Left 02/24/2023   Procedure: CATARACT EXTRACTION PHACO AND INTRAOCULAR LENS PLACEMENT (IOC);  Surgeon: Tarri Farm, MD;  Location: [facility];  Service: Ophthalmology;  Laterality: Left;  CDE: 4.87   CHOLECYSTECTOMY     COLONOSCOPY     COLONOSCOPY WITH PROPOFOL  N/A 06/17/2017   Dr. Nolene Baumgarten: 30 mm polyp removed from the ascending colon piecemeal, tattooed, tubular adenoma.  8 mm polyp in the ascending colon, four 3 to 5 mm polyps removed from the rectum and transverse colon.  Tubular adenomas.  External and internal hemorrhoids.  Redundant colon.  Random colon biopsies negative.  Next colonoscopy in 3 years.   EP IMPLANTABLE DEVICE N/A 09/19/2015   Procedure: Pacemaker Implant;  Surgeon: Will Cortland Ding, MD;  [facility];  Service: Cardiovascular;  Laterality: N/A;   ESOPHAGOGASTRODUODENOSCOPY (EGD) WITH PROPOFOL  N/A 06/17/2017   Dr.  Nolene Baumgarten: Medium sized hiatal hernia, mild erosive gastritis, small bowel biopsies negative for celiac.  Gastric biopsies with chronic mild inactive gastritis but no H. pylori.   INSERT / REPLACE / REMOVE PACEMAKER     POLYPECTOMY  06/17/2017   Procedure: POLYPECTOMY;  Surgeon: Alyce Jubilee, MD;  [facility];  Service: Endoscopy;;  colon   TONSILLECTOMY AND ADENOIDECTOMY     TUBAL LIGATION     Family History  Problem Relation Age of Onset   Congestive Heart Failure Mother    Diabetes Mother    Dementia Mother    Parkinson's disease Mother    Lung cancer Father    Diabetes Sister    Diabetes Maternal Grandmother    Allergic rhinitis Maternal Grandmother    Asthma Maternal Grandmother    Cancer Maternal Grandfather    Diabetes Paternal Grandmother  Heart attack Paternal Grandmother    Heart disease Paternal Grandmother    Asthma Paternal Grandmother    Angioedema Neg Hx    Eczema Neg Hx    Urticaria Neg Hx    Social History   Socioeconomic History   Marital status: Married    Spouse name: [name]   Number of children: Not on file   Years of education: Not on file   Highest education level: Not on file  Occupational History   Not on file  Tobacco Use   Smoking status: Former    Current packs/day: 0.00    Average packs/day: 1.5 packs/day for 20.0 years (30.0 ttl pk-yrs)    Types: Cigarettes    Start date: 06/03/1984    Quit date: 06/03/2004    Years since quitting: 19.3   Smokeless tobacco: Never   Tobacco comments:    quit x 11 yrs.  Vaping Use   Vaping status: Never Used  Substance and Sexual Activity   Alcohol use: No   Drug use: No   Sexual activity: Yes    Partners: Male    Birth control/protection: Surgical  Other Topics Concern   Not on file  Social History Narrative   Married.   Cares for great grandchildren during week.    Social Drivers of Corporate investment banker Strain: Low Risk  (09/26/2023)   Overall Financial Resource Strain  (CARDIA)    Difficulty of Paying Living Expenses: Not hard at all  Food Insecurity: No Food Insecurity (09/26/2023)   Hunger Vital Sign    Worried About Running Out of Food in the Last Year: Never true    Ran Out of Food in the Last Year: Never true  Transportation Needs: No Transportation Needs (09/26/2023)   PRAPARE - Administrator, Civil Service (Medical): No    Lack of Transportation (Non-Medical): No  Physical Activity: Sufficiently Active (09/26/2023)   Exercise Vital Sign    Days of Exercise per Week: 5 days    Minutes of Exercise per Session: 30 min  Stress: No Stress Concern Present (09/26/2023)   [employer] of Occupational Health - Occupational Stress Questionnaire    Feeling of Stress : Not at all  Social Connections: Socially Integrated (09/26/2023)   Social Connection and Isolation Panel [NHANES]    Frequency of Communication with Friends and Family: More than three times a week    Frequency of Social Gatherings with Friends and Family: More than three times a week    Attends Religious Services: More than 4 times per year    Active Member of [employer] or Organizations: Yes    Attends Engineer, structural: More than 4 times per year    Marital Status: Married    Tobacco Counseling Counseling given: Not Answered Tobacco comments: quit x 11 yrs.    Clinical Intake:  Pre-visit preparation completed: Yes  Pain : No/denies pain     Diabetes: No  Lab Results  Component Value Date   HGBA1C 5.6 04/07/2015     How often do you need to have someone help you when you read instructions, pamphlets, or other written materials from your doctor or pharmacy?: 1 - Never  Interpreter Needed?: No  Information entered by :: Seabron Cypress LPN   Activities of Daily Living     09/26/2023   12:56 PM 05/11/2023    9:00 PM  In your present state of health, do you have any difficulty performing the following activities:  Hearing?  0 0  Vision? 0 0   Difficulty concentrating or making decisions? 0 0  Walking or climbing stairs? 0   Dressing or bathing? 0   Doing errands, shopping? 0   Preparing Food and eating ? N   Using the Toilet? N   In the past six months, have you accidently leaked urine? N   Do you have problems with loss of bowel control? N   Managing your Medications? N   Managing your Finances? N   Housekeeping or managing your Housekeeping? N     Patient Care Team: Bennet Brasil, MD as PCP - General (Family Medicine) Lei Pump, MD as PCP - Cardiology (Cardiology) Scotty Cyphers, MD as Consulting Physician (Oncology) Alyce Jubilee, MD (Inactive) as Consulting Physician (Gastroenterology) Vinetta Greening, DO as Consulting Physician (Internal Medicine) Arlyce Berger, RN as [facility] Management Osa Blase, MD as Consulting Physician (Orthopedic Surgery) Tarri Farm, MD as Consulting Physician (Ophthalmology) Arlyce Berger, RN  Indicate any recent Medical Services you may have received from other than [facility] providers in the past year (date may be approximate).     Assessment:   This is a routine wellness examination for [name].  Hearing/Vision screen Hearing Screening - Comments:: Denies hearing difficulties   Vision Screening - Comments:: Wears rx glasses - up to date with routine eye exams with [facility].    Goals Addressed             This Visit's Progress    Remain active and independent   On track      Depression Screen    09/26/2023   12:43 PM 07/10/2023    3:19 PM 05/22/2023   10:36 AM 05/16/2023    3:01 PM 03/31/2023   10:45 AM 03/31/2023   10:40 AM 10/03/2022   11:50 AM  PHQ 2/9 Scores  PHQ - 2 Score 1 1 0 0 0 0 1  PHQ- 9 Score 7 7 4  0 0  5    Fall Risk     09/26/2023   12:56 PM 07/10/2023    3:18 PM 05/22/2023   10:35 AM 05/16/2023    3:01 PM 03/31/2023   10:39 AM  Fall Risk   Falls in the past year? 0 1 1 0 0  Number falls in past yr: 0 0 0 0 0   Injury with Fall? 0 1 1 0 0  Risk for fall due to : No Fall Risks   No Fall Risks   Follow up Falls prevention discussed;Education provided;Falls evaluation completed   Falls evaluation completed     MEDICARE RISK AT HOME:  Medicare Risk at Home Any stairs in or around the home?: No If so, are there any without handrails?: No Home free of loose throw rugs in walkways, pet beds, electrical cords, etc?: Yes Adequate lighting in your home to reduce risk of falls?: Yes Life alert?: No Use of a cane, walker or w/c?: No Grab bars in the bathroom?: Yes Shower chair or bench in shower?: No Elevated toilet seat or a handicapped toilet?: Yes  TIMED UP AND GO:  Was the test performed?  No  Cognitive Function: 6CIT completed        09/26/2023   12:58 PM 09/20/2022   11:36 AM 09/11/2021    3:19 PM  6CIT Screen  What Year? 0 points 0 points 0 points  What month? 0 points 0 points 0 points  What time? 0 points 0 points 0  points  Count back from 20 0 points 0 points 0 points  Months in reverse 0 points 0 points 0 points  Repeat phrase 0 points 0 points 0 points  Total Score 0 points 0 points 0 points    Immunizations Immunization History  Administered Date(s) Administered   Fluad Quad(high Dose 65+) 03/15/2022   Influenza,inj,Quad PF,6+ Mos 04/01/2018   Influenza-Unspecified 02/28/2015, 03/30/2017, 03/26/2019   Moderna Sars-Covid-2 Vaccination 06/12/2019, 07/13/2019   Pneumococcal Conjugate-13 04/01/2018   Pneumococcal Polysaccharide-23 01/07/2020   Td 10/02/2011   Zoster Recombinant(Shingrix) 07/23/2018, 06/04/2019, 12/03/2019    Screening Tests Health Maintenance  Topic Date Due   MAMMOGRAM  02/10/2022   Colonoscopy  06/17/2022   DTaP/Tdap/Td (2 - Tdap) 03/30/2024 (Originally 10/01/2021)   Hepatitis C Screening  03/30/2024 (Originally 04[birth year]25/1970)   INFLUENZA VACCINE  01/02/2024   Medicare Annual Wellness (AWV)  09/25/2024   Pneumonia Vaccine 57+ Years old  Completed    DEXA SCAN  Completed   Zoster Vaccines- Shingrix  Completed   HPV VACCINES  Aged Out   Meningococcal B Vaccine  Aged Out   COVID-19 Vaccine  Discontinued    Health Maintenance  Health Maintenance Due  Topic Date Due   MAMMOGRAM  02/10/2022   Colonoscopy  06/17/2022   Health Maintenance Items Addressed: Mammogram ordered; would like to wait until next office visit to discuss scheduling colon screening with provider  Additional Screening:  Vision Screening: Recommended annual ophthalmology exams for early detection of glaucoma and other disorders of the eye.  Dental Screening: Recommended annual dental exams for proper oral hygiene  Community Resource Referral / Chronic Care Management: CRR required this visit?  No   CCM required this visit?  No     Plan:     I have personally reviewed and noted the following in the patient's chart:   Medical and social history Use of alcohol, tobacco or illicit drugs  Current medications and supplements including opioid prescriptions. Patient is currently taking opioid prescriptions. Information provided to patient regarding non-opioid alternatives. Patient advised to discuss non-opioid treatment plan with their provider. Functional ability and status Nutritional status Physical activity Advanced directives List of other physicians Hospitalizations, surgeries, and ER visits in previous 12 months Vitals Screenings to include cognitive, depression, and falls Referrals and appointments  In addition, I have reviewed and discussed with patient certain preventive protocols, quality metrics, and best practice recommendations. A written personalized care plan for preventive services as well as general preventive health recommendations were provided to patient.     Seabron Cypress [place], [place]   1/61/0960   After Visit Summary: (MyChart) Due to this being a telephonic visit, the after visit summary with patients personalized plan was  offered to patient via MyChart   Notes: Nothing significant to report at this time.

## 2023-09-27 ENCOUNTER — Encounter: Payer: Self-pay | Admitting: Nurse Practitioner

## 2023-10-06 ENCOUNTER — Telehealth: Payer: Self-pay | Admitting: *Deleted

## 2023-10-06 NOTE — Progress Notes (Signed)
 Complex Care Management Care Guide Note  10/06/2023 Name: [name] MRN: [id number] DOB: 09/20/1950  [name] is a 73 y.o. year old female who is a primary care patient of Luking, Jackelyn Marvel, MD and is actively engaged with the care management team. I reached out to Shanieka G Chevalier by phone today to assist with re-scheduling  with the RN Case Manager.  Follow up plan: Unsuccessful telephone outreach attempt made. A HIPAA compliant phone message was left for the patient providing contact information and requesting a return call.  Barnie Bora  [facility]  [facility], [facility] Guide  Direct Dial: [phone]  Fax [phone]

## 2023-10-07 ENCOUNTER — Ambulatory Visit (INDEPENDENT_AMBULATORY_CARE_PROVIDER_SITE_OTHER): Payer: Medicare Other | Admitting: Family Medicine

## 2023-10-07 ENCOUNTER — Encounter: Payer: Self-pay | Admitting: Family Medicine

## 2023-10-07 VITALS — BP 114/68 | HR 77 | Temp 96.8°F | Ht 64.0 in | Wt 178.0 lb

## 2023-10-07 DIAGNOSIS — R42 Dizziness and giddiness: Secondary | ICD-10-CM

## 2023-10-07 DIAGNOSIS — K5900 Constipation, unspecified: Secondary | ICD-10-CM | POA: Diagnosis not present

## 2023-10-07 DIAGNOSIS — M810 Age-related osteoporosis without current pathological fracture: Secondary | ICD-10-CM

## 2023-10-07 DIAGNOSIS — R251 Tremor, unspecified: Secondary | ICD-10-CM

## 2023-10-07 DIAGNOSIS — R270 Ataxia, unspecified: Secondary | ICD-10-CM

## 2023-10-07 MED ORDER — SUMATRIPTAN SUCCINATE 50 MG PO TABS: 50.0000 mg | ORAL_TABLET | ORAL | 1 refills | Status: DC | PRN

## 2023-10-07 MED ORDER — TRIAMCINOLONE ACETONIDE 0.1 % EX CREA: TOPICAL_CREAM | CUTANEOUS | 4 refills | Status: AC

## 2023-10-07 NOTE — Progress Notes (Unsigned)
   Subjective:    Patient ID: [name], female    DOB: 05-Mar-1951, 73 y.o.   MRN: [id number]  HPI 3 month f/u  Patient going through a lot of grief from the loss of her husband She also has intermittent pelvic pain with sciatica issues she has soreness in her back not much down the leg Has a lot of balance issues feeling off balance ever since her fall She also describes a lot of dizziness She states at times she finds herself feeling woozy and dizzy in the head and has difficult time with walking without feeling like she is falling out of her She also has noticed over the past several months of tremors in her hands for no good reason that sometimes interfere with day-to-day living  Pelvic Fracture with Sciatica -having issue with balance  She complains of constipation MiraLAX  does not help a whole lot Linzess  is not covered by her insurance she is due for colonoscopy Rash- bilateral forearms, very itchy Ever since her husband died she has been itching her forearms and using Benadryl cream and its irritated and burning Review of Systems     Objective:   Physical Exam General-in no acute distress Eyes-no discharge Lungs-respiratory rate normal, CTA CV-no murmurs,RRR Extremities skin warm dry no edema Neuro grossly normal Behavior normal, alert Finger-to-nose is normal EOMI normal patient has decent strength legs and arms She wavers when she walks she also wavers with Romberg but it is negative she also has some mild ataxia walking down the hallway with times reaching out to the wall to give herself balance She also has a fine tremor in both hands and it is noticeable with activity but also noticeable at rest       Assessment & Plan:  1. Constipation, unspecified constipation type (Primary) We will refer her to gastroenterology She is due for colonoscopy recommend MiraLAX  with fiber for now 2. Dizziness Her blood pressure sitting and standing is very reasonable her  Romberg is negative We will do a CT scan of the head because any ataxia dizziness Patient cannot do MRI because of pacemaker   3. Tremor Fine tremor in the hands recommend referral to neurology for further evaluation  4. Ataxia Will set up CT scan Gave her printed out a balance exercises Patient at this time does not want to do physical therapy Recommend using a adjustable walking staff Recommend following up by fall time Bone density is due

## 2023-10-13 ENCOUNTER — Encounter: Payer: Self-pay | Admitting: Neurology

## 2023-10-15 NOTE — Progress Notes (Signed)
 Complex Care Management Care Guide Note  10/15/2023 Name: [name] MRN: [id number] DOB: Jan 05, 1951  [name] is a 73 y.o. year old female who is a primary care patient of Luking, Jackelyn Marvel, MD and is actively engaged with the care management team. I reached out to Tawyna G Broady by phone today to assist with re-scheduling  with the RN Case Manager.  Follow up plan:Patient would like for me to call back 7-14 days    Barnie Bora  [facility], [facility] Guide  Direct Dial: [phone]  Fax [phone]

## 2023-10-20 ENCOUNTER — Other Ambulatory Visit: Payer: Self-pay | Admitting: Nurse Practitioner

## 2023-10-20 MED ORDER — SUMATRIPTAN SUCCINATE 50 MG PO TABS: ORAL_TABLET | ORAL | 1 refills | Status: DC

## 2023-10-21 NOTE — Progress Notes (Signed)
 Assessment/Plan:   1.  Tremor, by history  - No significant degree of tremor seen today.  She does report that today is a "good day."  - Reassured the patient that I saw no evidence of a neurodegenerative process.  - Tremor seemed to start after multiple family losses, and stress certainly can play a role.  - She also describes some "episodes" (her words) of possible myoclonus (unclear as I did not see it today).  Nonetheless, we discussed that Lyrica  can cause myoclonus and she was hoping to wean off of this anyway.  She is really not on a high dose, but I have certainly seen it with any dose of Lyrica .  She states that she was on it for sciatica, which is markedly better, so she is going to talk to her primary care physician about potentially weaning off of that.  2.  History of complete heart block  - Status post pacemaker implantation in 2017  3.  Dizziness, now resolved  - Primary care note orthostatics were negative about 2 weeks ago  - Primary care ordered CT brain (patient unable to have MRI brain because of permanent pacemaker).  This has not yet been scheduled.  - On several medications that may be contributing to this sensation (Lyrica , ropinirole , Topamax ) but patient thinks that this is likely related to the fact that she wasn't eating well.  She is doing much better now in that regard and states that dizziness has resolved.  3.  History of migraine  - Patient on fairly high-dose Topamax  for her age and frequency of migraine is markedly decreased.  We talked about the fact that Topamax  may be able to be decreased, if her primary care feels that is reasonable.  If so, then I would probably cut Topamax  down to 150 mg for 2 weeks and then down to 100 mg for several months.  If she does well with that, then it can be weaned further and potentially discontinued.  -has migraine 2-3 times per month and takes gigerroot  - On triptan therapy.  Age is not a contraindication to triptan  therapy but coronary artery disease would be.  I do not see any history of that.  I cannot really state if she has cerebrovascular disease as she cannot have an MRI of the brain because of her permanent pacemaker and CT brain does not look at this very well; that being said, one has been ordered but has not been done yet and is scheduled for June 4.  I will take a look at that once completed.  4.  History of gait instability  - Suspect related to her pelvic fracture last December, resulting in significant sciatica.  She is doing much better in that regard.  Physical therapy helped significantly.  I do not see anything neurodegenerative in her walk.  She does have some evidence of peripheral neuropathy, and she states that she has known this for a long time.  5.  She is to follow-up with me on an as-needed basis.  I am happy to see her should symptoms progress or not resolve.  Subjective:   [name] was seen today in the movement disorders clinic for neurologic consultation at the request of Luking, Jackelyn Marvel, MD. patient is a 73 year old female with a history of complete heart block (status post PPM), GERD, paroxysmal atrial tachycardia, restless leg (on ropinirole  for about a year she thinks), migraine headache (on Topamax  -100 mg twice daily and low-dose  Imitrex , 50 mg) who presents for the evaluation of tremor.  Medical records made available to me are reviewed.    Reports that balance issues started after fall in December.   Her son-in-law was getting down Christmas decorations from the attic and was sliding an item down the attic stairs and was heavy (close to 50 pounds) and she was standing several rungs up the ladder trying to catch the items and she fell and ended up fracturing the pelvis.  She started to have balance trouble following this.  She had developed sciatica following this and was put on lyrica  but she is trying to wean off of it because "its the devil and I forget words."  Pt  recently lost her husband (march, 2025) and she lost her cognitively challenged brother who lived with her about a year ago.  Tremor developed after these losses but "I don't know when because" she was not paying attention to herself and was grieving.  The only new medication was lyrica .  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     Tremor: Yes.     At rest or with activation?  trouble with writing, trouble with holding mouse and it would start shaking.  Separately, she also notes some occasional involuntary jerking and trembling (like when trying to put her foot in a shoe)  Fam hx of tremor?  No.  Located where?  Bilateral UE  Affected by caffeine:  No. (3 cups coffee in Am)  Affected by alcohol:  doesn't notice this  Affected by stress:  No.  Affected by fatigue: doesn't notice it  Spills soup if on spoon:  No.  Spills glass of liquid if full:  No.  Affects ADL's (tying shoes, brushing teeth, etc):  No.  Other Specific Symptoms:  Voice: no change Wet Pillows: "I am mouth breather and always drool at night." Postural symptoms:  Yes.    Falls?  Yes.  ,  Had a fall in December, but the fall was a fairly "reasonable" fall.  Her son-in-law was getting down Christmas decorations from the attic and was sliding an item down the attic stairs and was heavy (close to 50 pounds) and she was standing several rungs up the ladder trying to catch the items and she fell and ended up fracturing the pelvis.  She has had no other falls. Bradykinesia symptoms: no bradykinesia noted Loss of smell:  No. Loss of taste:  No. Urinary Incontinence:  No. Difficulty Swallowing:  No. Handwriting, micrographia: No. Trouble with ADL's:  No.  Trouble buttoning clothing: Yes.   Memory changes:  some trouble with word finding.   N/V:  No. Lightheaded:  was but its now better - Primary care notes that he recently did orthostatics because of his  symptoms and they were negative (could not find the specific values).  She admits that she was not eating well previously Diplopia:  No.  Reports history of migraine, for which she takes Topamax  and was taking Maxalt .  It was recently changed to Imitrex , but she has not even taken it yet.  She states that she started taking ginger root and it dramatically helped, so much so that she has not had to use any triptan.  Her migraine frequency is about 2 or 3/month, but not have been so bad lately that she has had to use any triptan.  Neuroimaging of the brain has not previously been  performed.  She has a CT brain scheduled for June 4.   ALLERGIES:   Allergies  Allergen Reactions   Beta Adrenergic Blockers Shortness Of Breath   Ambien  [Zolpidem  Tartrate] Other (See Comments)    Hallucinations   Biaxin  [Clarithromycin ] Hives    May have caused hives see 06/19/15   Cymbalta  [Duloxetine  Hcl] Other (See Comments)    Vivid nightmares Grogginess   Fosamax  [Alendronate  Sodium] Other (See Comments)    Gastritis     CURRENT MEDICATIONS:  Current Outpatient Medications  Medication Instructions   buPROPion  (WELLBUTRIN  XL) 150 mg, Oral, Daily   cyanocobalamin (VITAMIN B12) 1,000 mcg, See admin instructions   Ginger, Zingiber officinalis, (GINGER PO) 1,000 mg, Daily   ibuprofen  (ADVIL ) 400 mg, Oral, Every 8 hours PRN, Do not exceed more than 1200 mg in 24 hours.   levocetirizine (XYZAL ) 5 mg, Every evening   montelukast  (SINGULAIR ) 10 mg, Oral, Daily at bedtime   Multiple Vitamins-Minerals (CENTRUM SILVER 50+WOMEN) TABS 1 tablet, Daily   pantoprazole  (PROTONIX ) 40 mg, Oral, Daily   pregabalin  (LYRICA ) 50 mg, Oral, 3 times daily   rOPINIRole  (REQUIP ) 0.5 mg, Oral, Daily at bedtime   rosuvastatin  (CRESTOR ) 10 MG tablet TAKE 1 TABLET BY MOUTH EVERY DAY FOR CHOLESTEROL   SUMAtriptan  (IMITREX ) 50 MG tablet Take one tab po at onset of migraine. May repeat in 2 hours if needed. Max 2 tabs per 24 hours.    topiramate  (TOPAMAX ) 100 mg, Oral, 2 times daily   triamcinolone  cream (KENALOG ) 0.1 % Apply thin amount bid prn to forearms   vitamin C 1,000 mg, Daily    Objective:   PHYSICAL EXAMINATION:    VITALS:   Vitals:   10/22/23 1351  Weight: 179 lb 12.8 oz (81.6 kg)  Height: 5' 4.5" (1.638 m)    GEN:  The patient appears stated age and is in NAD. HEENT:  Normocephalic, atraumatic.  The mucous membranes are moist. The superficial temporal arteries are without ropiness or tenderness. CV:  RRR Lungs:  CTAB Neck/HEME:  There are no carotid bruits bilaterally.  Neurological examination:  Orientation: The patient is alert and oriented x3.  Cranial nerves: There is good facial symmetry.  Extraocular muscles are intact. The visual fields are full to confrontational testing. The speech is fluent and clear. Soft palate rises symmetrically and there is no tongue deviation. Hearing is intact to conversational tone. Sensation: Sensation is intact to light touch throughout (facial, trunk, extremities). Vibration is decreased distally. There is no extinction with double simultaneous stimulation.  Motor: Strength is 5/5 in the bilateral upper and lower extremities.   Shoulder shrug is equal and symmetric.  There is no pronator drift. Deep tendon reflexes: Deep tendon reflexes are 0-1/4 at the bilateral biceps, triceps, brachioradialis, patella and achilles. Plantar responses are downgoing bilaterally.  Movement examination: Tone: There is nl tone in the bilateral upper extremities.  The tone in the lower extremities is n.  Abnormal movements: no rest tremor.  No postural tremor.  No intention tremor.  No trouble with archimedes spirals     Coordination:  There is no decremation with RAM's, with any form of RAMS, including alternating supination and pronation of the forearm, hand opening and closing, finger taps, heel taps and toe taps.  Gait and Station: The patient has no difficulty arising out of  a deep-seated chair without the use of the hands. The patient's stride length is good.  She does not shuffle.  I have reviewed and interpreted the  following labs independently   Chemistry      Component Value Date/Time   NA 141 05/11/2023 1319   NA 144 10/07/2022 1225   K 4.4 05/11/2023 1319   CL 105 05/11/2023 1319   CO2 25 05/11/2023 1319   BUN 17 05/11/2023 1319   BUN 15 10/07/2022 1225   CREATININE 0.85 05/11/2023 1319   CREATININE 0.92 04/18/2016 1615      Component Value Date/Time   CALCIUM  9.3 05/11/2023 1319   ALKPHOS 75 09/19/2023 1638   AST 21 09/19/2023 1638   ALT 16 09/19/2023 1638   BILITOT 0.2 09/19/2023 1638      Lab Results  Component Value Date   TSH 1.580 09/19/2023   Lab Results  Component Value Date   WBC 11.6 (H) 05/11/2023   HGB 13.5 05/11/2023   HCT 42.5 05/11/2023   MCV 98.2 05/11/2023   PLT 233 05/11/2023      Total time spent on today's visit was  60 minutes, including both face-to-face time and nonface-to-face time.  Time included that spent on review of records (prior notes available to me/labs/imaging if pertinent), discussing treatment and goals, answering patient's questions and coordinating care.  Cc:  Bennet Brasil, MD

## 2023-10-22 ENCOUNTER — Encounter: Payer: Self-pay | Admitting: Neurology

## 2023-10-22 ENCOUNTER — Ambulatory Visit: Admitting: Neurology

## 2023-10-22 VITALS — Ht 64.5 in | Wt 179.8 lb

## 2023-10-22 DIAGNOSIS — R251 Tremor, unspecified: Secondary | ICD-10-CM | POA: Diagnosis not present

## 2023-10-22 DIAGNOSIS — R2681 Unsteadiness on feet: Secondary | ICD-10-CM | POA: Diagnosis not present

## 2023-10-22 DIAGNOSIS — G253 Myoclonus: Secondary | ICD-10-CM

## 2023-10-22 NOTE — Patient Instructions (Signed)
 It was good to see you today!  We discussed possibly tapering the lyrica  in the future.    In the future, it may also be a good idea to start lowering the topamax  dosage.  The physicians and staff at [facility] are committed to providing excellent care. You may receive a survey requesting feedback about your experience at our office. We strive to receive "very good" responses to the survey questions. If you feel that your experience would prevent you from giving the office a "very good " response, please contact our office to try to remedy the situation. We may be reached at [phone]. Thank you for taking the time out of your busy day to complete the survey.

## 2023-10-28 NOTE — Addendum Note (Signed)
 Addended by: Lott Rouleau A on: 10/28/2023 10:58 AM   Modules accepted: Orders

## 2023-10-28 NOTE — Progress Notes (Signed)
 Remote pacemaker transmission.

## 2023-11-03 ENCOUNTER — Encounter: Payer: Self-pay | Admitting: Family Medicine

## 2023-11-05 ENCOUNTER — Telehealth: Payer: Self-pay | Admitting: *Deleted

## 2023-11-05 ENCOUNTER — Ambulatory Visit (HOSPITAL_COMMUNITY)

## 2023-11-05 NOTE — Progress Notes (Signed)
 Complex Care Management Care Guide Note  11/05/2023 Name: [name] MRN: [id number] DOB: June 22, 1950  [name] is a 73 y.o. year old female who is a primary care patient of Luking, Jackelyn Marvel, MD and is actively engaged with the care management team. I reached out to Avie G Vaca by phone today to assist with re-scheduling  with the RN Case Manager.  Follow up plan: Unsuccessful telephone outreach attempt made. A HIPAA compliant phone message was left for the patient providing contact information and requesting a return call. 3 unsuccessful outreaches attempted 08/05/23, 10/06/23 and 11/05/23  No further outreach attempts will be made at this time. We have been unable to contact the patient to reschedule for complex care management services.   Barnie Bora  [facility]  [facility], [facility] Guide  Direct Dial: [phone]  Fax [phone]

## 2023-11-12 DIAGNOSIS — M1711 Unilateral primary osteoarthritis, right knee: Secondary | ICD-10-CM | POA: Diagnosis not present

## 2023-11-17 ENCOUNTER — Ambulatory Visit (HOSPITAL_COMMUNITY)
Admission: RE | Admit: 2023-11-17 | Discharge: 2023-11-17 | Disposition: A | Discharge status: Home / Self Care | Source: Ambulatory Visit | Attending: Family Medicine | Admitting: Family Medicine

## 2023-11-17 DIAGNOSIS — R270 Ataxia, unspecified: Secondary | ICD-10-CM | POA: Insufficient documentation

## 2023-11-26 ENCOUNTER — Other Ambulatory Visit: Payer: Self-pay | Admitting: Family Medicine

## 2023-12-02 ENCOUNTER — Ambulatory Visit: Payer: Self-pay | Admitting: Family Medicine

## 2023-12-02 ENCOUNTER — Encounter: Payer: Self-pay | Admitting: Family Medicine

## 2023-12-08 ENCOUNTER — Other Ambulatory Visit: Payer: Self-pay | Admitting: Family Medicine

## 2023-12-08 MED ORDER — TOPIRAMATE 50 MG PO TABS: ORAL_TABLET | ORAL | 0 refills | Status: DC

## 2023-12-09 ENCOUNTER — Ambulatory Visit: Payer: Self-pay

## 2023-12-11 LAB — CUP PACEART REMOTE DEVICE CHECK
Battery Remaining Longevity: 20 mo
Battery Remaining Percentage: 16 %
Battery Voltage: 2.87 V
Brady Statistic AP VP Percent: 1 %
Brady Statistic AP VS Percent: 1 %
Brady Statistic AS VP Percent: 99 %
Brady Statistic AS VS Percent: 1 %
Brady Statistic RA Percent Paced: 1 %
Brady Statistic RV Percent Paced: 99 %
Date Time Interrogation Session: 20250709003700
Implantable Lead Connection Status: 753985
Implantable Lead Connection Status: 753985
Implantable Lead Implant Date: 20170418
Implantable Lead Implant Date: 20170418
Implantable Lead Location: 753859
Implantable Lead Location: 753860
Implantable Pulse Generator Implant Date: 20170418
Lead Channel Impedance Value: 460 Ohm
Lead Channel Impedance Value: 530 Ohm
Lead Channel Pacing Threshold Amplitude: 0.5 V
Lead Channel Pacing Threshold Amplitude: 0.875 V
Lead Channel Pacing Threshold Pulse Width: 0.5 ms
Lead Channel Pacing Threshold Pulse Width: 0.5 ms
Lead Channel Sensing Intrinsic Amplitude: 5 mV
Lead Channel Sensing Intrinsic Amplitude: 7.5 mV
Lead Channel Setting Pacing Amplitude: 1.125
Lead Channel Setting Pacing Amplitude: 2 V
Lead Channel Setting Pacing Pulse Width: 0.5 ms
Lead Channel Setting Sensing Sensitivity: 2 mV
Pulse Gen Model: 2272
Pulse Gen Serial Number: 7882213

## 2023-12-14 ENCOUNTER — Encounter: Payer: Self-pay | Admitting: Family Medicine

## 2023-12-15 ENCOUNTER — Other Ambulatory Visit: Payer: Self-pay | Admitting: Family Medicine

## 2023-12-15 MED ORDER — ROPINIROLE HCL 1 MG PO TABS: 1.0000 mg | ORAL_TABLET | Freq: Every day | ORAL | 1 refills | Status: DC

## 2023-12-16 ENCOUNTER — Ambulatory Visit: Payer: Self-pay

## 2023-12-16 ENCOUNTER — Ambulatory Visit (INDEPENDENT_AMBULATORY_CARE_PROVIDER_SITE_OTHER): Admitting: Family Medicine

## 2023-12-16 VITALS — BP 122/73 | HR 72 | Temp 98.1°F | Ht 64.5 in | Wt 182.8 lb

## 2023-12-16 DIAGNOSIS — R200 Anesthesia of skin: Secondary | ICD-10-CM | POA: Diagnosis not present

## 2023-12-16 DIAGNOSIS — G2581 Restless legs syndrome: Secondary | ICD-10-CM | POA: Diagnosis not present

## 2023-12-16 DIAGNOSIS — I878 Other specified disorders of veins: Secondary | ICD-10-CM | POA: Diagnosis not present

## 2023-12-16 DIAGNOSIS — M792 Neuralgia and neuritis, unspecified: Secondary | ICD-10-CM | POA: Diagnosis not present

## 2023-12-16 MED ORDER — PREGABALIN 25 MG PO CAPS: 25.0000 mg | ORAL_CAPSULE | Freq: Two times a day (BID) | ORAL | 3 refills | Status: DC

## 2023-12-16 NOTE — Telephone Encounter (Signed)
Has appointment later today.

## 2023-12-16 NOTE — Telephone Encounter (Signed)
 FYI Only or Action Required?: FYI only for provider.  Patient was last seen in primary care on 10/07/2023 by Alphonsa Glendia LABOR, MD.  Called Nurse Triage reporting leg cramps.  Symptoms began yesterday.  Interventions attempted: Nothing.  Symptoms are: stable.  Triage Disposition: See PCP Within 2 Weeks  Patient/caregiver understands and will follow disposition?: Yes    Copied from CRM [id number]. Topic: Clinical - Red Word Triage >> Dec 16, 2023 10:54 AM Charlet HERO wrote: Red Word that prompted transfer to Nurse Triage: Patient is having issue with restless leg syndrome her legs turned blue last night and she has severe cramping, she could not sleep. She is taking her meds and it is still happening. Reason for Disposition  Leg pain or muscle cramp is a chronic symptom (recurrent or ongoing AND present > 4 weeks)  Answer Assessment - Initial Assessment Questions Patient says she has restless legs and last night her feet turned blue, which is the first time this has happened. She says they are normal colored now. She was up all night due to restless legs. Advised OV for eval, she agreed.  1. ONSET: When did the pain start?      Last night feet turned blue 2. LOCATION: Where is the pain located?      Both feet turned blue, right heel shooting pain last night 3. PAIN: How bad is the pain?    (Scale 1-10; or mild, moderate, severe)     Last night pain 3-restless legs   5. CAUSE: What do you think is causing the leg pain?     neuropathy 6. OTHER SYMPTOMS: Do you have any other symptoms? (e.g., chest pain, back pain, breathing difficulty, swelling, rash, fever, numbness, weakness)     Feet turned blue last night and normal colored today, no symptoms today  Protocols used: Leg Pain-A-AH

## 2023-12-16 NOTE — Progress Notes (Signed)
 Subjective:    Patient ID: [name], female    DOB: January 17, 1951, 73 y.o.   MRN: [id number]  HPI Pt comes in today because her restless leg seems to have been really bad last night and during the night when she got up her feet were blue and tingling. Balance is off as well. Medications and allergies reviewed.  History of Present Illness   The patient presents with worsening restless leg syndrome and neuropathy symptoms.  Over the past two weeks, there has been a significant increase in restless leg syndrome symptoms, severely impacting her sleep. She experiences persistent leg movements and discomfort that prevent deep sleep. She has been taking Requip  (ropinirole ), previously at 0.5 mg (two tablets), and recently picked up a 1 mg dose. Despite this adjustment, sleepless nights continue; the patient reports both stress and restless legs as contributing factors.  In addition to restless leg syndrome, she experiences discomfort in her feet, characterized by tingling and sharp pains, described as 'legs going to sleep and never waking up.' She has noticed her feet turning blue and has been experiencing ankle swelling and fluid retention. She has a history of neuropathy, which she feels has worsened since discontinuing pregabalin  (Lyrica ) due to concerns about balance issues and forgetfulness. Since stopping the medication, neuropathy symptoms have increased, and she has not found significant relief from over-the-counter medications like ibuprofen .  She has a history of carpal tunnel syndrome, with significant tingling and numbness in the upper part of her arm, persisting for about a month. The symptoms are not alleviated by rest and are only temporarily relieved by rubbing the affected area. She also experiences chronic neck pain and reports no numbness in her hand despite experiencing numbness in the upper arm.  She has been under considerable stress, having recently been the primary  caregiver for her granddaughter, who underwent a kidney transplant two weeks ago. This has involved spending significant time at the hospital, contributing to her stress and impacting her overall health. She mentions gaining weight and feeling as though her body is 'falling apart' since an injury in December. She also reports her feet tend to get cool or cold, and she wears socks at night to manage this.      Review of Systems     Objective:   Physical Exam  General-in no acute distress Eyes-no discharge Lungs-respiratory rate normal, CTA CV-no murmurs,RRR Extremities skin warm dry no edema Neuro grossly normal Behavior normal, alert       Assessment & Plan:  Restless Leg Syndrome Symptoms worsened, causing sleep disturbances. Increased ropinirole  to 2 mg for symptom management. Discontinued pregabalin  due to adverse effects. - Increase ropinirole  to 2 mg for two weeks, assess effectiveness. - Order blood work to check iron levels. - Advise 5 mg melatonin at night if needed. - Minimize caffeine intake after 2 PM.  Peripheral Neuropathy Tingling and discomfort in feet worsened after stopping pregabalin . Considering low-dose pregabalin  to manage symptoms while minimizing side effects. - Reinitiate pregabalin  at 25 mg twice daily. - Monitor for side effects such as balance issues or cognitive impairment. - Adjust dose based on symptom control and side effects.  Venous Stasis Blue discoloration of feet due to sluggish venous return, not arterial insufficiency. - Advise keeping feet warm and elevated.  Carpal Tunnel Syndrome Tingling and numbness in upper arm likely due to carpal tunnel syndrome. Symptoms persistent. - Monitor symptoms, consider referral for nerve conduction studies if symptoms worsen. Currently right now the symptoms  are not severe but if they get worse she is to let us  know and we will help set up with neurology  Stress and Grief Significant stress and grief  following personal loss and caregiving responsibilities. Counseling considered for stress, anxiety, and grief management. - Consider referral to counseling services when ready. - Discuss options for individual counseling through hospice or other services.  Currently she is very busy with multiple issues going on once this settles down she will let us  know we will do referral Hospice has counseling within their services

## 2023-12-17 ENCOUNTER — Ambulatory Visit: Payer: Self-pay | Admitting: Family Medicine

## 2023-12-17 DIAGNOSIS — M1711 Unilateral primary osteoarthritis, right knee: Secondary | ICD-10-CM | POA: Diagnosis not present

## 2023-12-17 LAB — IRON,TIBC AND FERRITIN PANEL
Ferritin: 60 ng/mL (ref 15–150)
Iron Saturation: 31 % (ref 15–55)
Iron: 101 ug/dL (ref 27–139)
Total Iron Binding Capacity: 326 ug/dL (ref 250–450)
UIBC: 225 ug/dL (ref 118–369)

## 2023-12-18 ENCOUNTER — Ambulatory Visit: Payer: Self-pay | Admitting: Cardiology

## 2024-01-07 ENCOUNTER — Encounter: Payer: Self-pay | Admitting: Family Medicine

## 2024-01-10 ENCOUNTER — Other Ambulatory Visit: Payer: Self-pay | Admitting: Family Medicine

## 2024-01-26 DIAGNOSIS — L821 Other seborrheic keratosis: Secondary | ICD-10-CM | POA: Diagnosis not present

## 2024-01-26 DIAGNOSIS — L719 Rosacea, unspecified: Secondary | ICD-10-CM | POA: Diagnosis not present

## 2024-01-26 DIAGNOSIS — L570 Actinic keratosis: Secondary | ICD-10-CM | POA: Diagnosis not present

## 2024-01-26 DIAGNOSIS — D485 Neoplasm of uncertain behavior of skin: Secondary | ICD-10-CM | POA: Diagnosis not present

## 2024-02-06 ENCOUNTER — Ambulatory Visit: Admitting: Family Medicine

## 2024-02-12 ENCOUNTER — Ambulatory Visit (INDEPENDENT_AMBULATORY_CARE_PROVIDER_SITE_OTHER): Admitting: Nurse Practitioner

## 2024-02-12 ENCOUNTER — Encounter: Payer: Self-pay | Admitting: Nurse Practitioner

## 2024-02-12 VITALS — BP 116/67 | HR 75 | Temp 96.6°F | Ht 64.5 in | Wt 182.0 lb

## 2024-02-12 DIAGNOSIS — G609 Hereditary and idiopathic neuropathy, unspecified: Secondary | ICD-10-CM | POA: Diagnosis not present

## 2024-02-12 DIAGNOSIS — G2581 Restless legs syndrome: Secondary | ICD-10-CM | POA: Diagnosis not present

## 2024-02-12 DIAGNOSIS — Z1231 Encounter for screening mammogram for malignant neoplasm of breast: Secondary | ICD-10-CM

## 2024-02-12 DIAGNOSIS — Z1211 Encounter for screening for malignant neoplasm of colon: Secondary | ICD-10-CM | POA: Diagnosis not present

## 2024-02-12 DIAGNOSIS — M810 Age-related osteoporosis without current pathological fracture: Secondary | ICD-10-CM

## 2024-02-12 DIAGNOSIS — J3089 Other allergic rhinitis: Secondary | ICD-10-CM

## 2024-02-12 MED ORDER — PREDNISONE 20 MG PO TABS: ORAL_TABLET | ORAL | 0 refills | Status: AC

## 2024-02-12 NOTE — Progress Notes (Signed)
 Subjective:    Patient ID: [name], female    DOB: August 09, 1950, 73 y.o.   MRN: [id number]  HPI Discussed the use of AI scribe software for clinical note transcription with the patient, who gave verbal consent to proceed.  History of Present Illness [name] is a 73 year old female with restless leg syndrome and nerve pain who presents with worsening symptoms and medication management.  She has a long-standing history of restless leg syndrome, which has recently worsened, causing significant sleep disturbances. She is currently taking ropinirole  at a dose of 1 mg at bedtime. The symptoms are severe enough to prevent sleep two to three nights a week.  She experiences nerve pain related to a past injury, described as a 'deep toothache' pain in her back, exacerbated by certain positions such as sitting in a recliner. She has been taking pregabalin  (Lyrica ) 25 mg, two tablets at night, as it causes fuzziness during the day. This medication has been somewhat effective in managing her symptoms.  She has a history of osteoporosis and has previously undergone infusions for this condition. She has not had treatment in a couple of years and is concerned about shrinking. It has been four years since her last bone density scan.  She reports a recent allergic reaction while working in her yard, leading to throat irritation and coughing. She used her albuterol  inhaler twice since the incident. She takes montelukast  (Singulair ) and Xyzal  for her allergies. She also uses saline nasal spray regularly. She reports sinus pressure and occasional epistaxis, particularly during the fall allergy  season.  She has a history of smoking but quit over fifteen years ago. She lives with her daughter and grandson and recently decided to sell her home of over thirty years due to maintenance challenges.   Review of Systems  Constitutional:  Positive for fatigue. Negative for fever.  HENT:  Positive for  congestion, postnasal drip, sinus pressure and sore throat. Negative for ear pain.   Respiratory:  Positive for cough and wheezing. Negative for chest tightness and shortness of breath.        Occasional mild wheezing and cough after working outside.  Cardiovascular:  Negative for chest pain and leg swelling.  Musculoskeletal:  Positive for gait problem.       Difficulty going up and down steps and getting onto the exam table due to her traumatic injury in the past.      02/12/2024   11:40 AM  Depression screen PHQ 2/9  Decreased Interest 0  Down, Depressed, Hopeless 0  PHQ - 2 Score 0  Altered sleeping 1  Tired, decreased energy 1  Change in appetite 0  Feeling bad or failure about yourself  0  Trouble concentrating 1  Moving slowly or fidgety/restless 2  Suicidal thoughts 0  PHQ-9 Score 5  Difficult doing work/chores Not difficult at all      02/12/2024   11:40 AM 07/10/2023    3:19 PM 05/22/2023   10:38 AM 05/16/2023    3:02 PM  GAD 7 : Generalized Anxiety Score  Nervous, Anxious, on Edge 1 1 1  0  Control/stop worrying 1 1 1  0  Worry too much - different things 0 1 1 0  Trouble relaxing 1 1 1  0  Restless 1 1 1  0  Easily annoyed or irritable 0 1 1 0  Afraid - awful might happen 0 1  0  Total GAD 7 Score 4 7  0  Anxiety Difficulty Not  difficult at all Somewhat difficult Not difficult at all Not difficult at all    Social History   Tobacco Use   Smoking status: Former    Current packs/day: 0.00    Average packs/day: 1.5 packs/day for 20.0 years (30.0 ttl pk-yrs)    Types: Cigarettes    Start date: 06/03/1984    Quit date: 06/03/2004    Years since quitting: 19.7   Smokeless tobacco: Never   Tobacco comments:    quit x 11 yrs.  Vaping Use   Vaping status: Never Used  Substance Use Topics   Alcohol use: No   Drug use: No        Objective:   Physical Exam Vitals and nursing note reviewed.  Constitutional:      General: She is not in acute distress. HENT:      Ears:     Comments: TMs retracted bilaterally, no erythema.    Mouth/Throat:     Mouth: Mucous membranes are moist.     Comments: Posterior pharynx mildly injected with clear to cloudy PND noted. Cardiovascular:     Rate and Rhythm: Normal rate and regular rhythm.  Pulmonary:     Effort: Pulmonary effort is normal.     Breath sounds: Normal breath sounds.  Musculoskeletal:     Cervical back: Neck supple.  Lymphadenopathy:     Cervical: No cervical adenopathy.  Neurological:     Mental Status: She is alert and oriented to person, place, and time.  Psychiatric:        Mood and Affect: Mood normal.        Behavior: Behavior normal.        Thought Content: Thought content normal.        Judgment: Judgment normal.    Today's Vitals   02/12/24 1057  BP: 116/67  Pulse: 75  Temp: (!) 96.6 F (35.9 C)  SpO2: 94%  Weight: 182 lb (82.6 kg)  Height: 5' 4.5 (1.638 m)   Body mass index is 30.76 kg/m.           Assessment & Plan:  1. Perennial allergic rhinitis (Primary) Recent exacerbation due to pollen exposure with throat irritation, sinus pressure, and cloudy drainage. Lungs clear, no bronchitis or pneumonia. Antibiotics not indicated unless symptoms worsen. - Use Flonase  nasal spray. - Continue Xyzal . - Consider mask and sunglasses outdoors. - Monitor albuterol  use; consider second inhaler if needed. - Prescribe prednisone  taper to keep on hand if if symptoms worsen. - Hold antibiotics unless symptoms persist or worsen. - predniSONE  (DELTASONE ) 20 MG tablet; 3 po qd x 3 d then 2 po qd x 3 d then 1 po qd x 2 d  Dispense: 17 tablet; Refill: 0  2. Restless leg syndrome Chronic restless legs syndrome and neuropathic pain managed with pregabalin  and ropinirole . Ropinirole  dose recently increased with potential for further titration. - Increase ropinirole  by 1 mg weekly, up to 4 mg. - Monitor response to ropinirole  titration. - Consider titrating pregabalin  if pain  persists.  3. Idiopathic peripheral neuropathy Chronic restless legs syndrome and neuropathic pain managed with pregabalin  and ropinirole . Ropinirole  dose recently increased with potential for further titration. - Increase ropinirole  by 1 mg weekly, up to 4 mg. - Monitor response to ropinirole  titration. - Consider titrating pregabalin  if pain persists.  4. Breast cancer screening by mammogram  - MM 3D SCREENING MAMMOGRAM BILATERAL BREAST  5. Screen for colon cancer  - Ambulatory referral to Gastroenterology  6. Age-related osteoporosis without  current pathological fracture  - DG Bone Density  Due for routine health screenings and vaccinations. Discussed importance of regular exams, mammogram, colonoscopy, and flu vaccination. - Administer high-dose flu vaccine when respiratory symptoms resolve. - Schedule mammogram and bone density scan. - Schedule colonoscopy due to history of polyps. - Encourage regular female exams.  Return if symptoms worsen or fail to improve, for Recommend preventive health physical.

## 2024-02-18 ENCOUNTER — Encounter (INDEPENDENT_AMBULATORY_CARE_PROVIDER_SITE_OTHER): Payer: Self-pay | Admitting: *Deleted

## 2024-02-22 ENCOUNTER — Encounter: Payer: Self-pay | Admitting: Nurse Practitioner

## 2024-02-22 ENCOUNTER — Other Ambulatory Visit: Payer: Self-pay | Admitting: Family Medicine

## 2024-02-24 ENCOUNTER — Other Ambulatory Visit: Payer: Self-pay | Admitting: Family Medicine

## 2024-02-24 ENCOUNTER — Encounter: Payer: Self-pay | Admitting: Family Medicine

## 2024-02-24 DIAGNOSIS — R252 Cramp and spasm: Secondary | ICD-10-CM

## 2024-02-24 MED ORDER — ROPINIROLE HCL 3 MG PO TABS: 3.0000 mg | ORAL_TABLET | Freq: Every day | ORAL | 0 refills | Status: DC

## 2024-02-24 NOTE — Telephone Encounter (Signed)
 Nurses In the situations I would recommend metabolic 7 and magnesium  due to leg cramps  I would also recommend that she do gentle calf muscle stretches 2-3 times per day  Staying well-hydrated would be fine but no need to over hydrate.  Please send this message to [place] after you have put the lab orders in so that she will go to do these.  She does not have to be fasting.  Thanks-Dr. Glendia

## 2024-02-25 ENCOUNTER — Other Ambulatory Visit: Payer: Self-pay

## 2024-02-25 DIAGNOSIS — R252 Cramp and spasm: Secondary | ICD-10-CM

## 2024-02-26 ENCOUNTER — Ambulatory Visit: Payer: Self-pay | Admitting: Family Medicine

## 2024-02-26 LAB — BASIC METABOLIC PANEL WITH GFR
BUN/Creatinine Ratio: 22 (ref 12–28)
BUN: 20 mg/dL (ref 8–27)
CO2: 24 mmol/L (ref 20–29)
Calcium: 9.4 mg/dL (ref 8.7–10.3)
Chloride: 103 mmol/L (ref 96–106)
Creatinine, Ser: 0.92 mg/dL (ref 0.57–1.00)
Glucose: 61 mg/dL — ABNORMAL LOW (ref 70–99)
Potassium: 4.8 mmol/L (ref 3.5–5.2)
Sodium: 141 mmol/L (ref 134–144)
eGFR: 67 mL/min/1.73 (ref >59)

## 2024-03-09 ENCOUNTER — Ambulatory Visit: Payer: Self-pay

## 2024-03-09 DIAGNOSIS — I442 Atrioventricular block, complete: Secondary | ICD-10-CM

## 2024-03-10 LAB — CUP PACEART REMOTE DEVICE CHECK
Battery Remaining Longevity: 17 mo
Battery Remaining Percentage: 13 %
Battery Voltage: 2.86 V
Brady Statistic AP VP Percent: 1 %
Brady Statistic AP VS Percent: 1 %
Brady Statistic AS VP Percent: 99 %
Brady Statistic AS VS Percent: 1 %
Brady Statistic RA Percent Paced: 1 %
Brady Statistic RV Percent Paced: 99 %
Date Time Interrogation Session: 20251007020018
Implantable Lead Connection Status: 753985
Implantable Lead Connection Status: 753985
Implantable Lead Implant Date: 20170418
Implantable Lead Implant Date: 20170418
Implantable Lead Location: 753859
Implantable Lead Location: 753860
Implantable Pulse Generator Implant Date: 20170418
Lead Channel Impedance Value: 480 Ohm
Lead Channel Impedance Value: 560 Ohm
Lead Channel Pacing Threshold Amplitude: 0.5 V
Lead Channel Pacing Threshold Amplitude: 0.75 V
Lead Channel Pacing Threshold Pulse Width: 0.5 ms
Lead Channel Pacing Threshold Pulse Width: 0.5 ms
Lead Channel Sensing Intrinsic Amplitude: 5 mV
Lead Channel Sensing Intrinsic Amplitude: 7.5 mV
Lead Channel Setting Pacing Amplitude: 1 V
Lead Channel Setting Pacing Amplitude: 2 V
Lead Channel Setting Pacing Pulse Width: 0.5 ms
Lead Channel Setting Sensing Sensitivity: 2 mV
Pulse Gen Model: 2272
Pulse Gen Serial Number: 7882213

## 2024-03-12 NOTE — Progress Notes (Signed)
 Remote PPM Transmission

## 2024-03-14 ENCOUNTER — Ambulatory Visit: Payer: Self-pay | Admitting: Cardiology

## 2024-03-15 ENCOUNTER — Other Ambulatory Visit: Payer: Self-pay | Admitting: Family Medicine

## 2024-03-18 ENCOUNTER — Other Ambulatory Visit: Payer: Self-pay | Admitting: Family Medicine

## 2024-03-18 ENCOUNTER — Encounter: Payer: Self-pay | Admitting: Family Medicine

## 2024-03-18 MED ORDER — ROPINIROLE HCL 3 MG PO TABS: 3.0000 mg | ORAL_TABLET | Freq: Every day | ORAL | 1 refills | Status: DC

## 2024-03-23 ENCOUNTER — Ambulatory Visit: Admitting: Gastroenterology

## 2024-04-09 ENCOUNTER — Other Ambulatory Visit: Payer: Self-pay | Admitting: Family Medicine

## 2024-04-18 ENCOUNTER — Other Ambulatory Visit: Payer: Self-pay | Admitting: Nurse Practitioner

## 2024-05-26 ENCOUNTER — Other Ambulatory Visit: Payer: Self-pay | Admitting: Family Medicine

## 2024-05-28 ENCOUNTER — Other Ambulatory Visit: Payer: Self-pay

## 2024-05-30 MED ORDER — PREGABALIN 25 MG PO CAPS: 25.0000 mg | ORAL_CAPSULE | Freq: Two times a day (BID) | ORAL | 5 refills | Status: AC

## 2024-06-05 ENCOUNTER — Other Ambulatory Visit: Payer: Self-pay | Admitting: Family Medicine

## 2024-06-08 ENCOUNTER — Ambulatory Visit: Payer: Self-pay

## 2024-06-08 ENCOUNTER — Other Ambulatory Visit (HOSPITAL_COMMUNITY): Payer: Self-pay | Admitting: Nurse Practitioner

## 2024-06-08 DIAGNOSIS — Z1231 Encounter for screening mammogram for malignant neoplasm of breast: Secondary | ICD-10-CM

## 2024-06-08 DIAGNOSIS — I442 Atrioventricular block, complete: Secondary | ICD-10-CM

## 2024-06-09 ENCOUNTER — Other Ambulatory Visit (HOSPITAL_COMMUNITY): Payer: Self-pay | Admitting: Family Medicine

## 2024-06-09 ENCOUNTER — Inpatient Hospital Stay (HOSPITAL_COMMUNITY): Admission: RE | Admit: 2024-06-09 | Source: Ambulatory Visit

## 2024-06-09 DIAGNOSIS — Z1231 Encounter for screening mammogram for malignant neoplasm of breast: Secondary | ICD-10-CM

## 2024-06-09 LAB — CUP PACEART REMOTE DEVICE CHECK
Battery Remaining Longevity: 13 mo
Battery Remaining Percentage: 11 %
Battery Voltage: 2.84 V
Brady Statistic AP VP Percent: 1 %
Brady Statistic AP VS Percent: 1 %
Brady Statistic AS VP Percent: 99 %
Brady Statistic AS VS Percent: 1 %
Brady Statistic RA Percent Paced: 1 %
Brady Statistic RV Percent Paced: 99 %
Date Time Interrogation Session: 20260106020026
Implantable Lead Connection Status: 753985
Implantable Lead Connection Status: 753985
Implantable Lead Implant Date: 20170418
Implantable Lead Implant Date: 20170418
Implantable Lead Location: 753859
Implantable Lead Location: 753860
Implantable Pulse Generator Implant Date: 20170418
Lead Channel Impedance Value: 450 Ohm
Lead Channel Impedance Value: 510 Ohm
Lead Channel Pacing Threshold Amplitude: 0.5 V
Lead Channel Pacing Threshold Amplitude: 0.875 V
Lead Channel Pacing Threshold Pulse Width: 0.5 ms
Lead Channel Pacing Threshold Pulse Width: 0.5 ms
Lead Channel Sensing Intrinsic Amplitude: 5 mV
Lead Channel Sensing Intrinsic Amplitude: 7.5 mV
Lead Channel Setting Pacing Amplitude: 1.125
Lead Channel Setting Pacing Amplitude: 2 V
Lead Channel Setting Pacing Pulse Width: 0.5 ms
Lead Channel Setting Sensing Sensitivity: 2 mV
Pulse Gen Model: 2272
Pulse Gen Serial Number: 7882213

## 2024-06-10 ENCOUNTER — Ambulatory Visit: Payer: Self-pay | Admitting: Cardiology

## 2024-06-10 NOTE — Progress Notes (Signed)
 Remote PPM Transmission

## 2024-06-17 ENCOUNTER — Ambulatory Visit: Admitting: Nurse Practitioner

## 2024-06-17 VITALS — BP 129/83 | HR 84 | Temp 97.8°F | Ht 64.5 in | Wt 178.0 lb

## 2024-06-17 DIAGNOSIS — G43709 Chronic migraine without aura, not intractable, without status migrainosus: Secondary | ICD-10-CM

## 2024-06-17 DIAGNOSIS — M810 Age-related osteoporosis without current pathological fracture: Secondary | ICD-10-CM

## 2024-06-17 DIAGNOSIS — G609 Hereditary and idiopathic neuropathy, unspecified: Secondary | ICD-10-CM

## 2024-06-17 DIAGNOSIS — R233 Spontaneous ecchymoses: Secondary | ICD-10-CM

## 2024-06-17 DIAGNOSIS — Z01411 Encounter for gynecological examination (general) (routine) with abnormal findings: Secondary | ICD-10-CM

## 2024-06-17 DIAGNOSIS — E782 Mixed hyperlipidemia: Secondary | ICD-10-CM | POA: Diagnosis not present

## 2024-06-17 DIAGNOSIS — Z01419 Encounter for gynecological examination (general) (routine) without abnormal findings: Secondary | ICD-10-CM

## 2024-06-17 DIAGNOSIS — L578 Other skin changes due to chronic exposure to nonionizing radiation: Secondary | ICD-10-CM | POA: Diagnosis not present

## 2024-06-17 DIAGNOSIS — K5909 Other constipation: Secondary | ICD-10-CM | POA: Diagnosis not present

## 2024-06-17 DIAGNOSIS — R200 Anesthesia of skin: Secondary | ICD-10-CM | POA: Diagnosis not present

## 2024-06-17 DIAGNOSIS — G2581 Restless legs syndrome: Secondary | ICD-10-CM | POA: Diagnosis not present

## 2024-06-17 DIAGNOSIS — R5383 Other fatigue: Secondary | ICD-10-CM | POA: Diagnosis not present

## 2024-06-17 MED ORDER — TOPIRAMATE 100 MG PO TABS: ORAL_TABLET | ORAL | 5 refills | Status: AC

## 2024-06-17 NOTE — Patient Instructions (Signed)
 Please call [facility]/Bone density scheduling [phone]

## 2024-06-17 NOTE — Progress Notes (Signed)
 "  Subjective:    Patient ID: [name], female    DOB: September 12, 1950, 74 y.o.   MRN: [id number]  HPI Patient is in room 9   Pt. Stated she is having trouble falling asleep / staying asleep throughout the night  Pt. Stated she is having more frequent headaches  Pt. Is experiencing finger numbness that starts at the thumb area and works its way to the wrist and runs to upper arm. Its in both left/right arms. Worse on right.  See note 12/16/2023.  Discussed the use of AI scribe software for clinical note transcription with the patient, who gave verbal consent to proceed.  History of Present Illness [name] is a 74 year old female who presents for an annual physical exam.  She has a history of chronic constipation, which has impacted her ability to schedule a colonoscopy. A consultation is required before the procedure can be scheduled. Has been contacted by GI and plans to make an appointment.   She experienced significant weight loss of about ten pounds during a period of high activity and stress, but her weight has stabilized recently. She attributes the weight loss to being busy and not snacking anymore. Her diet consists mainly of vegetables, occasional chicken, and seafood. She takes B12 supplements due to low meat consumption.  She has a history of fibrocystic breast disease and has had multiple tumors removed, leading to breast implants. She suspects a leak in one of the implants.  She has not had a hysterectomy but had one ovary removed due to a non-cancerous tumor. No pelvic pain, vaginal discharge, or other female issues. Widowed, no new sexual partners.   She quit smoking approximately twenty years ago and denies any current use of tobacco products. She has been very active, recently moving to a new home, which involved significant downsizing and physical activity. She enjoys decorating and gardening, which keeps her busy. Also regular walking.   She reports  bruising easily and questions if vitamin K might help. Does not take aspirin or other anticoagulants.   Regular dental and vision exams.  Mammogram scheduled tomorrow.  Gets yearly skin cancer screenings.  Regular yearly follow-up with cardiology for her pacemaker.   Review of Systems  Constitutional:  Positive for fatigue. Negative for activity change and appetite change.  HENT:  Negative for sore throat and trouble swallowing.   Respiratory:  Negative for cough, chest tightness, shortness of breath and wheezing.   Cardiovascular:  Negative for chest pain.  Gastrointestinal:  Positive for constipation. Negative for abdominal distention, abdominal pain, blood in stool, diarrhea, nausea and vomiting.       One recent bout with N/V and diarrhea which has resolved.  Takes MiraLAX  for chronic constipation.  Genitourinary:  Negative for difficulty urinating, dysuria, enuresis, frequency, genital sores, pelvic pain, urgency, vaginal bleeding and vaginal discharge.  Neurological:  Positive for numbness and headaches. Negative for dizziness, syncope, facial asymmetry, speech difficulty and weakness.       Flare up of migraines lately.  Topiramate  was discontinued at 1 point by neurology to see if this would help her dizziness, she did not notice any change at that time.  This worked well for her migraines and would like to restart.  Patient continues to have numbness and tingling in the right arm and hand, see previous note.  Psychiatric/Behavioral:  Positive for sleep disturbance.       06/17/2024   10:19 AM  Depression screen PHQ 2/9  Decreased  Interest 0  Down, Depressed, Hopeless 1  PHQ - 2 Score 1  Altered sleeping 3  Tired, decreased energy 0  Change in appetite 3  Feeling bad or failure about yourself  0  Trouble concentrating 2  Moving slowly or fidgety/restless 2  Suicidal thoughts 0  PHQ-9 Score 11  Difficult doing work/chores Somewhat difficult      06/17/2024   10:20 AM  02/12/2024   11:40 AM 07/10/2023    3:19 PM 05/22/2023   10:38 AM  GAD 7 : Generalized Anxiety Score  Nervous, Anxious, on Edge 1 1 1 1   Control/stop worrying 1 1 1 1   Worry too much - different things 1 0 1 1  Trouble relaxing 1 1 1 1   Restless 1 1 1 1   Easily annoyed or irritable 0 0 1 1  Afraid - awful might happen 1 0 1   Total GAD 7 Score 6 4 7    Anxiety Difficulty Not difficult at all Not difficult at all Somewhat difficult Not difficult at all    Social History[1]      Objective:   Physical Exam Vitals and nursing note reviewed.  Constitutional:      General: She is not in acute distress.    Appearance: She is well-developed.  Neck:     Thyroid : No thyromegaly.     Trachea: No tracheal deviation.     Comments: Thyroid  non tender to palpation. No mass or goiter noted.  Cardiovascular:     Rate and Rhythm: Normal rate and regular rhythm.     Heart sounds: Normal heart sounds. No murmur heard. Pulmonary:     Effort: Pulmonary effort is normal.     Breath sounds: Normal breath sounds.  Chest:  Breasts:    Right: No swelling, inverted nipple, mass, skin change or tenderness.     Left: No swelling, inverted nipple, mass, skin change or tenderness.  Abdominal:     General: There is no distension.     Palpations: Abdomen is soft.     Tenderness: There is no abdominal tenderness.  Genitourinary:    Comments: Defers GU exam, denies any problems including rash lesions irritation or itching. Musculoskeletal:     Cervical back: Normal range of motion and neck supple.     Right lower leg: No edema.     Left lower leg: No edema.  Lymphadenopathy:     Cervical: No cervical adenopathy.     Upper Body:     Right upper body: No supraclavicular, axillary or pectoral adenopathy.     Left upper body: No supraclavicular, axillary or pectoral adenopathy.  Skin:    General: Skin is warm and dry.     Findings: Bruising present.     Comments: A few areas of ecchymoses in various  stages of resolving noted particularly on the arms.  Neurological:     Mental Status: She is alert and oriented to person, place, and time.  Psychiatric:        Mood and Affect: Mood normal.        Behavior: Behavior normal.        Thought Content: Thought content normal.        Judgment: Judgment normal.     Comments: Cheerful, calm affect.     Today's Vitals   06/17/24 0850  BP: 129/83  Pulse: 84  Temp: 97.8 F (36.6 C)  SpO2: 96%  Weight: 178 lb (80.7 kg)  Height: 5' 4.5 (1.638 m)   Body  mass index is 30.08 kg/m.       Assessment & Plan:  1. Well woman exam (Primary) - Scheduled mammogram for tomorrow. - Instructed to schedule bone density scan. Continue healthy lifestyle.  Given information so she can schedule her bone density scan, order is already in the chart.  2. Chronic migraine without aura without status migrainosus, not intractable Restart topiramate  as directed.  Note patient has tolerated this fine in the past and has worked well for her migraines.  Discontinue medication and contact office if any problems.  Also if no improvement in her migraines.  States there is no change in symptomatology. - topiramate  (TOPAMAX ) 100 MG tablet; Take 1/2 tab (50 mg) po BID x 7 days then one po BID for migraines  Dispense: 60 tablet; Refill: 5  3. Restless leg syndrome Continue ropinirole  as directed. - CBC with Differential/Platelet  4. Idiopathic peripheral neuropathy  - CBC with Differential/Platelet - Ambulatory referral to Neurology  5. Right arm numbness  - Ambulatory referral to Neurology  6. Mixed hyperlipidemia  - Lipid panel  7. Other fatigue  - CBC with Differential/Platelet - Comprehensive metabolic panel with GFR - TSH + free T4  8. Easy bruising Will check platelet count.  Patient to contact the office if any excessive bleeding. - CBC with Differential/Platelet  9. Chronic constipation Patient to schedule an appointment with  gastroenterology since she will need to be seen before her colonoscopy due to her history of chronic constipation.  10. Age-related osteoporosis without current pathological fracture Patient given information so she can schedule her bone density test.  11. Sun-damaged skin Continue follow-up with dermatology for skin cancer screening.  Return in about 6 months (around 12/15/2024).          [1]  Social History Tobacco Use   Smoking status: Former    Current packs/day: 0.00    Average packs/day: 1.5 packs/day for 20.0 years (30.0 ttl pk-yrs)    Types: Cigarettes    Start date: 06/03/1984    Quit date: 06/03/2004    Years since quitting: 20.0   Smokeless tobacco: Never   Tobacco comments:    quit x 11 yrs.  Vaping Use   Vaping status: Never Used  Substance Use Topics   Alcohol use: No   Drug use: No   "

## 2024-06-18 ENCOUNTER — Other Ambulatory Visit (HOSPITAL_COMMUNITY): Payer: Self-pay | Admitting: Family Medicine

## 2024-06-18 ENCOUNTER — Ambulatory Visit (HOSPITAL_COMMUNITY)
Admission: RE | Admit: 2024-06-18 | Discharge: 2024-06-18 | Disposition: A | Discharge status: Home / Self Care | Source: Ambulatory Visit | Attending: Family Medicine | Admitting: Family Medicine

## 2024-06-18 DIAGNOSIS — Z1231 Encounter for screening mammogram for malignant neoplasm of breast: Secondary | ICD-10-CM | POA: Diagnosis present

## 2024-06-19 ENCOUNTER — Other Ambulatory Visit: Payer: Self-pay | Admitting: Family Medicine

## 2024-06-19 ENCOUNTER — Ambulatory Visit: Payer: Self-pay | Admitting: Nurse Practitioner

## 2024-06-19 ENCOUNTER — Other Ambulatory Visit: Payer: Self-pay | Admitting: Nurse Practitioner

## 2024-06-19 ENCOUNTER — Encounter: Payer: Self-pay | Admitting: Nurse Practitioner

## 2024-06-19 DIAGNOSIS — E875 Hyperkalemia: Secondary | ICD-10-CM

## 2024-06-19 LAB — COMPREHENSIVE METABOLIC PANEL WITH GFR
ALT: 21 IU/L (ref 0–32)
AST: 25 IU/L (ref 0–40)
Albumin: 4.3 g/dL (ref 3.8–4.8)
Alkaline Phosphatase: 69 IU/L (ref 49–135)
BUN/Creatinine Ratio: 19 (ref 12–28)
BUN: 16 mg/dL (ref 8–27)
Bilirubin Total: 0.4 mg/dL (ref 0.0–1.2)
CO2: 23 mmol/L (ref 20–29)
Calcium: 9.4 mg/dL (ref 8.7–10.3)
Chloride: 102 mmol/L (ref 96–106)
Creatinine, Ser: 0.85 mg/dL (ref 0.57–1.00)
Globulin, Total: 2.2 g/dL (ref 1.5–4.5)
Glucose: 105 mg/dL — ABNORMAL HIGH (ref 70–99)
Potassium: 5.3 mmol/L — ABNORMAL HIGH (ref 3.5–5.2)
Sodium: 139 mmol/L (ref 134–144)
Total Protein: 6.5 g/dL (ref 6.0–8.5)
eGFR: 73 mL/min/1.73

## 2024-06-19 LAB — CBC WITH DIFFERENTIAL/PLATELET
Basophils Absolute: 0.1 x10E3/uL (ref 0.0–0.2)
Basos: 1 %
EOS (ABSOLUTE): 0.5 x10E3/uL — ABNORMAL HIGH (ref 0.0–0.4)
Eos: 7 %
Hematocrit: 45.7 % (ref 34.0–46.6)
Hemoglobin: 14.6 g/dL (ref 11.1–15.9)
Immature Grans (Abs): 0 x10E3/uL (ref 0.0–0.1)
Immature Granulocytes: 0 %
Lymphocytes Absolute: 2.7 x10E3/uL (ref 0.7–3.1)
Lymphs: 35 %
MCH: 30.8 pg (ref 26.6–33.0)
MCHC: 31.9 g/dL (ref 31.5–35.7)
MCV: 96 fL (ref 79–97)
Monocytes Absolute: 0.6 x10E3/uL (ref 0.1–0.9)
Monocytes: 8 %
Neutrophils Absolute: 4 x10E3/uL (ref 1.4–7.0)
Neutrophils: 49 %
Platelets: 252 x10E3/uL (ref 150–450)
RBC: 4.74 x10E6/uL (ref 3.77–5.28)
RDW: 13 % (ref 11.7–15.4)
WBC: 7.9 x10E3/uL (ref 3.4–10.8)

## 2024-06-19 LAB — LIPID PANEL
Chol/HDL Ratio: 2.1 ratio (ref 0.0–4.4)
Cholesterol, Total: 172 mg/dL (ref 100–199)
HDL: 82 mg/dL
LDL Chol Calc (NIH): 77 mg/dL (ref 0–99)
Triglycerides: 71 mg/dL (ref 0–149)
VLDL Cholesterol Cal: 13 mg/dL (ref 5–40)

## 2024-06-19 LAB — TSH+FREE T4
Free T4: 1.12 ng/dL (ref 0.82–1.77)
TSH: 1.37 u[IU]/mL (ref 0.450–4.500)

## 2024-06-21 ENCOUNTER — Other Ambulatory Visit (HOSPITAL_COMMUNITY)

## 2024-06-30 ENCOUNTER — Other Ambulatory Visit (HOSPITAL_COMMUNITY)

## 2024-07-07 ENCOUNTER — Other Ambulatory Visit: Payer: Self-pay | Admitting: Family Medicine

## 2024-07-13 ENCOUNTER — Other Ambulatory Visit (HOSPITAL_COMMUNITY)

## 2024-11-01 ENCOUNTER — Ambulatory Visit: Admitting: Neurology

## 2024-12-23 ENCOUNTER — Ambulatory Visit: Admitting: Family Medicine
